# Patient Record
Sex: Female | Born: 1955
Health system: Southern US, Community
[De-identification: ages and names within clinical notes are randomized; demographics above are authoritative.]

## PROBLEM LIST (undated history)

## (undated) DIAGNOSIS — E785 Hyperlipidemia, unspecified: Secondary | ICD-10-CM

## (undated) DIAGNOSIS — J309 Allergic rhinitis, unspecified: Secondary | ICD-10-CM

## (undated) DIAGNOSIS — K5909 Other constipation: Secondary | ICD-10-CM

## (undated) DIAGNOSIS — I10 Essential (primary) hypertension: Secondary | ICD-10-CM

## (undated) DIAGNOSIS — G250 Essential tremor: Secondary | ICD-10-CM

## (undated) DIAGNOSIS — K449 Diaphragmatic hernia without obstruction or gangrene: Secondary | ICD-10-CM

## (undated) DIAGNOSIS — G8929 Other chronic pain: Secondary | ICD-10-CM

## (undated) DIAGNOSIS — IMO0001 Reserved for inherently not codable concepts without codable children: Secondary | ICD-10-CM

## (undated) DIAGNOSIS — H409 Unspecified glaucoma: Secondary | ICD-10-CM

## (undated) DIAGNOSIS — K529 Noninfective gastroenteritis and colitis, unspecified: Secondary | ICD-10-CM

## (undated) DIAGNOSIS — F329 Major depressive disorder, single episode, unspecified: Secondary | ICD-10-CM

## (undated) DIAGNOSIS — R7989 Other specified abnormal findings of blood chemistry: Secondary | ICD-10-CM

## (undated) DIAGNOSIS — R945 Abnormal results of liver function studies: Secondary | ICD-10-CM

## (undated) DIAGNOSIS — C4370 Malignant melanoma of unspecified lower limb, including hip: Secondary | ICD-10-CM

## (undated) DIAGNOSIS — F32A Depression, unspecified: Secondary | ICD-10-CM

## (undated) DIAGNOSIS — K219 Gastro-esophageal reflux disease without esophagitis: Secondary | ICD-10-CM

## (undated) DIAGNOSIS — A0472 Enterocolitis due to Clostridium difficile, not specified as recurrent: Secondary | ICD-10-CM

## (undated) DIAGNOSIS — M722 Plantar fascial fibromatosis: Secondary | ICD-10-CM

## (undated) HISTORY — DX: Other specified abnormal findings of blood chemistry: R79.89

## (undated) HISTORY — DX: Major depressive disorder, single episode, unspecified: F32.9

## (undated) HISTORY — DX: Gastro-esophageal reflux disease without esophagitis: K21.9

## (undated) HISTORY — DX: Other constipation: K59.09

## (undated) HISTORY — DX: Essential (primary) hypertension: I10

## (undated) HISTORY — DX: Malignant melanoma of unspecified lower limb, including hip: C43.70

## (undated) HISTORY — DX: Other chronic pain: G89.29

## (undated) HISTORY — PX: NASAL SINUS SURGERY: SHX719

## (undated) HISTORY — DX: Abnormal results of liver function studies: R94.5

## (undated) HISTORY — DX: Essential tremor: G25.0

## (undated) HISTORY — DX: Diaphragmatic hernia without obstruction or gangrene: K44.9

## (undated) HISTORY — DX: Plantar fascial fibromatosis: M72.2

## (undated) HISTORY — DX: Depression, unspecified: F32.A

## (undated) HISTORY — DX: Enterocolitis due to Clostridium difficile, not specified as recurrent: A04.72

## (undated) HISTORY — DX: Allergic rhinitis, unspecified: J30.9

## (undated) HISTORY — DX: Noninfective gastroenteritis and colitis, unspecified: K52.9

## (undated) HISTORY — PX: COLONOSCOPY: SHX174

## (undated) HISTORY — DX: Hyperlipidemia, unspecified: E78.5

## (undated) HISTORY — PX: TEMPOROMANDIBULAR JOINT SURGERY: SHX35

## (undated) HISTORY — PX: BREAST SURGERY: SHX581

## (undated) HISTORY — DX: Unspecified glaucoma: H40.9

## (undated) HISTORY — DX: Reserved for inherently not codable concepts without codable children: IMO0001

---

## 1992-12-21 DIAGNOSIS — A0472 Enterocolitis due to Clostridium difficile, not specified as recurrent: Secondary | ICD-10-CM | POA: Insufficient documentation

## 1992-12-21 HISTORY — DX: Enterocolitis due to Clostridium difficile, not specified as recurrent: A04.72

## 1997-12-21 DIAGNOSIS — C4370 Malignant melanoma of unspecified lower limb, including hip: Secondary | ICD-10-CM

## 1997-12-21 HISTORY — PX: OTHER SURGICAL HISTORY: SHX169

## 1997-12-21 HISTORY — DX: Malignant melanoma of unspecified lower limb, including hip: C43.70

## 1999-11-18 ENCOUNTER — Other Ambulatory Visit: Admission: RE | Admit: 1999-11-18 | Discharge: 1999-11-18 | Payer: Self-pay | Admitting: Obstetrics and Gynecology

## 2000-01-06 ENCOUNTER — Encounter: Admission: RE | Admit: 2000-01-06 | Discharge: 2000-01-06 | Payer: Self-pay | Admitting: Gastroenterology

## 2000-01-06 ENCOUNTER — Encounter: Payer: Self-pay | Admitting: Gastroenterology

## 2000-03-03 ENCOUNTER — Encounter: Payer: Self-pay | Admitting: Dermatology

## 2000-03-03 ENCOUNTER — Encounter: Admission: RE | Admit: 2000-03-03 | Discharge: 2000-03-03 | Payer: Self-pay | Admitting: Dermatology

## 2000-11-23 ENCOUNTER — Other Ambulatory Visit: Admission: RE | Admit: 2000-11-23 | Discharge: 2000-11-23 | Payer: Self-pay | Admitting: Obstetrics and Gynecology

## 2001-02-02 ENCOUNTER — Other Ambulatory Visit: Admission: RE | Admit: 2001-02-02 | Discharge: 2001-02-02 | Payer: Self-pay | Admitting: Otolaryngology

## 2001-05-02 ENCOUNTER — Encounter: Admission: RE | Admit: 2001-05-02 | Discharge: 2001-05-02 | Payer: Self-pay | Admitting: Dermatology

## 2001-05-02 ENCOUNTER — Encounter: Payer: Self-pay | Admitting: Dermatology

## 2001-08-01 ENCOUNTER — Other Ambulatory Visit: Admission: RE | Admit: 2001-08-01 | Discharge: 2001-08-01 | Payer: Self-pay | Admitting: Obstetrics and Gynecology

## 2001-12-01 ENCOUNTER — Encounter: Admission: RE | Admit: 2001-12-01 | Discharge: 2001-12-01 | Payer: Self-pay | Admitting: Orthopaedic Surgery

## 2001-12-01 ENCOUNTER — Encounter: Payer: Self-pay | Admitting: Orthopaedic Surgery

## 2002-08-22 ENCOUNTER — Other Ambulatory Visit: Admission: RE | Admit: 2002-08-22 | Discharge: 2002-08-22 | Payer: Self-pay | Admitting: Obstetrics and Gynecology

## 2002-12-01 ENCOUNTER — Ambulatory Visit (HOSPITAL_COMMUNITY): Admission: RE | Admit: 2002-12-01 | Discharge: 2002-12-01 | Payer: Self-pay | Admitting: Gastroenterology

## 2003-09-03 ENCOUNTER — Other Ambulatory Visit: Admission: RE | Admit: 2003-09-03 | Discharge: 2003-09-03 | Payer: Self-pay | Admitting: Obstetrics and Gynecology

## 2004-12-21 HISTORY — PX: DILATION AND CURETTAGE OF UTERUS: SHX78

## 2005-10-28 ENCOUNTER — Ambulatory Visit (HOSPITAL_BASED_OUTPATIENT_CLINIC_OR_DEPARTMENT_OTHER): Admission: RE | Admit: 2005-10-28 | Discharge: 2005-10-28 | Payer: Self-pay | Admitting: Obstetrics and Gynecology

## 2005-10-28 ENCOUNTER — Ambulatory Visit (HOSPITAL_COMMUNITY): Admission: RE | Admit: 2005-10-28 | Discharge: 2005-10-28 | Payer: Self-pay | Admitting: Obstetrics and Gynecology

## 2006-10-21 DIAGNOSIS — E785 Hyperlipidemia, unspecified: Secondary | ICD-10-CM

## 2006-10-21 HISTORY — DX: Hyperlipidemia, unspecified: E78.5

## 2007-02-14 ENCOUNTER — Encounter: Admission: RE | Admit: 2007-02-14 | Discharge: 2007-02-14 | Payer: Self-pay | Admitting: Family Medicine

## 2011-03-22 HISTORY — PX: SHOULDER SURGERY: SHX246

## 2011-04-20 ENCOUNTER — Institutional Professional Consult (permissible substitution) (INDEPENDENT_AMBULATORY_CARE_PROVIDER_SITE_OTHER): Payer: BC Managed Care – PPO | Admitting: Family Medicine

## 2011-04-20 DIAGNOSIS — I10 Essential (primary) hypertension: Secondary | ICD-10-CM

## 2011-04-20 DIAGNOSIS — E7800 Pure hypercholesterolemia, unspecified: Secondary | ICD-10-CM

## 2011-04-20 DIAGNOSIS — R748 Abnormal levels of other serum enzymes: Secondary | ICD-10-CM

## 2011-04-23 ENCOUNTER — Encounter: Payer: Self-pay | Admitting: Family Medicine

## 2011-05-06 ENCOUNTER — Telehealth: Payer: Self-pay | Admitting: *Deleted

## 2011-05-06 NOTE — Telephone Encounter (Signed)
Called patient to give instructions per Dr.Knapp, left message for her to return my call.

## 2011-05-07 ENCOUNTER — Telehealth: Payer: Self-pay | Admitting: *Deleted

## 2011-05-07 NOTE — Telephone Encounter (Signed)
Spoke with patient. Informed pt to stay off pravastatin, cont with plan to recheck liver tests in July 2012(pt sch for 06/22/11@ 8:30am) also informed patient to avoid alcohol and tylenol. Informed pt that she doesn't to continue with rx vitamin d as she was already on it for 3 months, but to take OTC vitamin D, 2,000iu daily. Pt verbalized understanding. vs

## 2011-06-22 ENCOUNTER — Other Ambulatory Visit: Payer: BC Managed Care – PPO

## 2011-06-22 DIAGNOSIS — E785 Hyperlipidemia, unspecified: Secondary | ICD-10-CM

## 2011-06-22 LAB — LIPID PANEL
Cholesterol: 177 mg/dL (ref 0–200)
Triglycerides: 95 mg/dL (ref <150)

## 2011-06-23 LAB — HEPATIC FUNCTION PANEL
Alkaline Phosphatase: 103 U/L (ref 39–117)
Indirect Bilirubin: 0.4 mg/dL (ref 0.0–0.9)
Total Protein: 6.8 g/dL (ref 6.0–8.3)

## 2011-07-31 ENCOUNTER — Other Ambulatory Visit: Payer: Self-pay | Admitting: *Deleted

## 2011-07-31 MED ORDER — METOPROLOL TARTRATE 25 MG PO TABS: 25.0000 mg | ORAL_TABLET | Freq: Two times a day (BID) | ORAL | Status: DC

## 2011-10-19 ENCOUNTER — Encounter: Payer: Self-pay | Admitting: Family Medicine

## 2011-10-19 ENCOUNTER — Ambulatory Visit (INDEPENDENT_AMBULATORY_CARE_PROVIDER_SITE_OTHER): Payer: BC Managed Care – PPO | Admitting: Family Medicine

## 2011-10-19 VITALS — BP 130/86 | HR 68 | Ht 66.5 in | Wt 161.0 lb

## 2011-10-19 DIAGNOSIS — R748 Abnormal levels of other serum enzymes: Secondary | ICD-10-CM

## 2011-10-19 DIAGNOSIS — I10 Essential (primary) hypertension: Secondary | ICD-10-CM

## 2011-10-19 DIAGNOSIS — E559 Vitamin D deficiency, unspecified: Secondary | ICD-10-CM

## 2011-10-19 DIAGNOSIS — Z23 Encounter for immunization: Secondary | ICD-10-CM

## 2011-10-19 DIAGNOSIS — E7800 Pure hypercholesterolemia, unspecified: Secondary | ICD-10-CM

## 2011-10-19 LAB — HEPATIC FUNCTION PANEL
ALT: 66 U/L — ABNORMAL HIGH (ref 0–35)
AST: 28 U/L (ref 0–37)
Bilirubin, Direct: 0.1 mg/dL (ref 0.0–0.3)

## 2011-10-19 NOTE — Progress Notes (Signed)
Patient presents for 6 month f/u on HTN, as well as f/u on elevated liver tests, lipids, and is due to have Vitamin D level re-checked.  She has been off of Pravastatin since the beginning of the year.  Lipids were checked here in July, and was at goal off medications.  Liver enzymes remained mildly elevated, but stable. Lab Results  Component Value Date   CHOL 177 06/22/2011   HDL 61 06/22/2011   LDLCALC 97 06/22/2011   TRIG 95 06/22/2011   CHOLHDL 2.9 06/22/2011   Hypertension follow-up:  Blood pressures elsewhere are 121-147/61-73. P 52-56. Denies dizziness, headaches, chest pain.  Denies side effects of medications.  Sometimes gets fatigued just walking up a flight of steps.  Works out with trainer weekly, and denies easy fatigability or other problems during exercise.  Vitamin D deficiency--took a course of prescription Vitamin D (3 months).  She was recommended to take 2000 IU daily.  She is getting her vitamin D through her calcium supplement and her MVI.  Due for recheck today.  Past Medical History  Diagnosis Date  . Depression     h/o hospitalization '87  . Chronic pain     f/b Dr. Jerene Bears cervical DDD, disk bulge L5-S1  . Chronic constipation   . Hemorrhoids 12/08    internal and external  . Allergic rhinitis, cause unspecified     seasonal  . Melanoma of thigh 1999    R thigh, level 1 (Dr. Terri Piedra)  . C. difficile colitis 1994  . Hyperlipidemia 11/07  . Elevated liver function tests     normal work-up  . Vitamin D deficiency 2010  . Hypertension   . Reflux   . Hiatal hernia     Past Surgical History  Procedure Date  . Colonoscopy 11/2007    Dr. Ewing Schlein; due again 2013  . Shoulder surgery 03-2011    Right; bone spur removal (Dr. Thomasena Edis)  . Excision of melanoma 1999    R thigh  . Breast surgery 1987, 1998    Right breast--removal of benign tumor and benign cyst  . Temporomandibular joint surgery 1987, 1989    artificial disks in TMJ; Dr. Warren Danes  . Dilation  and curettage of uterus 2006    bleeding (benign)  . Nasal sinus surgery 1995, 2002    History   Social History  . Marital Status: Single    Spouse Name: N/A    Number of Children: 0  . Years of Education: N/A   Occupational History  . actuarial analyst    Social History Main Topics  . Smoking status: Never Smoker   . Smokeless tobacco: Never Used  . Alcohol Use: Yes     1 glass of wine per year.  . Drug Use: No  . Sexually Active: Not on file   Other Topics Concern  . Not on file   Social History Narrative   Lives alone, 2 cats    Family History  Problem Relation Age of Onset  . Hypertension Mother   . Allergies Mother   . Hyperlipidemia Mother   . Arthritis Mother   . Melanoma Mother   . Cancer Mother     uterine cancer and melanoma  . Heart disease Mother     atrial fibrillation  . Glaucoma Father   . Depression Father   . Cancer Father     prostate cancer, diagnosed in 79's  . Colon polyps Father   . Cancer Maternal Aunt  breast cancer in 40's  . Heart disease Maternal Aunt   . Heart disease Maternal Uncle   . Heart disease Maternal Uncle     Current outpatient prescriptions:acetaminophen-codeine (TYLENOL #3) 300-30 MG per tablet, Take 1 tablet by mouth daily.  , Disp: , Rfl: ;  ascorbic acid (VITAMIN C) 500 MG tablet, Take 1,000 mg by mouth daily. , Disp: , Rfl: ;  Calcium Carbonate-Vitamin D (CALCIUM 600+D) 600-400 MG-UNIT per tablet, Take 2 tablets by mouth daily.  , Disp: , Rfl: ;  docusate sodium (COLACE) 100 MG capsule, Take 100-200 mg by mouth daily.  , Disp: , Rfl:  fish oil-omega-3 fatty acids 1000 MG capsule, Take 2 g by mouth daily. , Disp: , Rfl: ;  fluticasone (FLONASE) 50 MCG/ACT nasal spray, 2 sprays by Nasal route as needed.  , Disp: , Rfl: ;  GLUCOSAMINE CHONDROITIN COMPLX PO, Take 2 tablets by mouth daily.  , Disp: , Rfl: ;  Magnesium 250 MG TABS, Take 1 tablet by mouth daily.  , Disp: , Rfl:  metoprolol tartrate (LOPRESSOR) 25 MG  tablet, Take 1 tablet (25 mg total) by mouth 2 (two) times daily., Disp: 180 tablet, Rfl: 0;  Multiple Vitamin (MULTIVITAMIN) tablet, Take 1 tablet by mouth daily.  , Disp: , Rfl: ;  Olopatadine HCl (PATADAY) 0.2 % SOLN, Apply 1 drop to eye daily. Daily to both eyes as needed. , Disp: , Rfl: ;  RABEprazole (ACIPHEX) 20 MG tablet, Take 20 mg by mouth daily.  , Disp: , Rfl:  sertraline (ZOLOFT) 100 MG tablet, Take 100 mg by mouth daily.  , Disp: , Rfl: ;  vitamin B-12 (CYANOCOBALAMIN) 1000 MCG tablet, Take 1,000 mcg by mouth daily.  , Disp: , Rfl:   Allergies  Allergen Reactions  . Darvocet (Propoxyphene N-Acetaminophen) Other (See Comments)    Faint, blood pressure goes sky high.  . Etodolac Other (See Comments)    Red face, arms-looked like bad sunburn.  . Septra (Bactrim) Hives   ROS: +stress headaches and headaches from her neck.  Denies chest pain, palpitations, shortness of breath.  Some occasional easy fatigability.  Denies edema. No fevers, URI symptoms.  See HPI.  PHYSICAL EXAM: BP 130/86  Pulse 68  Ht 5' 6.5" (1.689 m)  Wt 161 lb (73.029 kg)  BMI 25.60 kg/m2 Pleasant, well developed female in no distress  BP 140/73 with pt's wrist machine, L wrist 154/70 RA by MD Repeat Pt's L wrist 141/77 Neck: no lymphadenopathy or thyromegaly Heart: regular rate and rhythm without murmur Lungs: clear bilaterally Abdomen: soft, nontender, no organomegaly or mass Extremities: no edema Skin: no rash  ASSESSMENT/PLAN:  1. Need for Tdap vaccination  Tdap vaccine greater than or equal to 7yo IM  2. Unspecified vitamin D deficiency  Vitamin D 25 hydroxy  3. Other nonspecific abnormal serum enzyme levels  Hepatic function panel  4. Essential hypertension, benign  Comprehensive metabolic panel  5. Pure hypercholesterolemia  Lipid panel   Some component of white coat HTN.  HTN overall adequately controlled. She has some mild bradycardia related to beta blockers, but relatively asymptomatic.   Continue current meds.  Will pharmacy contact us when refills are needed.  F/u 6 months with labs prior, sooner prn

## 2011-10-19 NOTE — Patient Instructions (Signed)
Continue monitoring your blood pressure and pulse.  Feel free to follow up sooner if having symptoms of dizziness, easy fatigability, shortness of breath, or other concerns.  Otherwise, see you in 6 months

## 2011-10-20 ENCOUNTER — Encounter: Payer: Self-pay | Admitting: Family Medicine

## 2011-10-20 DIAGNOSIS — I10 Essential (primary) hypertension: Secondary | ICD-10-CM | POA: Insufficient documentation

## 2011-10-20 DIAGNOSIS — E7800 Pure hypercholesterolemia, unspecified: Secondary | ICD-10-CM | POA: Insufficient documentation

## 2011-12-18 ENCOUNTER — Other Ambulatory Visit: Payer: Self-pay

## 2012-01-07 ENCOUNTER — Other Ambulatory Visit: Payer: Self-pay | Admitting: Family Medicine

## 2012-04-05 ENCOUNTER — Telehealth: Payer: Self-pay | Admitting: Family Medicine

## 2012-04-05 ENCOUNTER — Other Ambulatory Visit: Payer: Self-pay | Admitting: Family Medicine

## 2012-04-05 DIAGNOSIS — F329 Major depressive disorder, single episode, unspecified: Secondary | ICD-10-CM

## 2012-04-05 MED ORDER — SERTRALINE HCL 100 MG PO TABS: 100.0000 mg | ORAL_TABLET | Freq: Every day | ORAL | Status: DC

## 2012-04-05 NOTE — Telephone Encounter (Signed)
Patient needs to come in for a office visit.

## 2012-04-05 NOTE — Telephone Encounter (Signed)
done

## 2012-04-18 ENCOUNTER — Other Ambulatory Visit: Payer: BC Managed Care – PPO

## 2012-04-18 ENCOUNTER — Telehealth: Payer: Self-pay | Admitting: Family Medicine

## 2012-04-18 DIAGNOSIS — E7800 Pure hypercholesterolemia, unspecified: Secondary | ICD-10-CM

## 2012-04-18 DIAGNOSIS — I10 Essential (primary) hypertension: Secondary | ICD-10-CM

## 2012-04-18 LAB — LIPID PANEL
HDL: 61 mg/dL (ref >39)
LDL Cholesterol: 112 mg/dL — ABNORMAL HIGH (ref 0–99)
Total CHOL/HDL Ratio: 3.1 Ratio

## 2012-04-18 LAB — COMPREHENSIVE METABOLIC PANEL
ALT: 36 U/L — ABNORMAL HIGH (ref 0–35)
AST: 35 U/L (ref 0–37)
Albumin: 4.6 g/dL (ref 3.5–5.2)
Alkaline Phosphatase: 86 U/L (ref 39–117)
BUN: 17 mg/dL (ref 6–23)
Calcium: 9.5 mg/dL (ref 8.4–10.5)
Chloride: 101 mEq/L (ref 96–112)
Creat: 0.71 mg/dL (ref 0.50–1.10)
Potassium: 4.2 mEq/L (ref 3.5–5.3)

## 2012-04-20 ENCOUNTER — Ambulatory Visit: Payer: BC Managed Care – PPO | Admitting: Family Medicine

## 2012-04-27 ENCOUNTER — Encounter: Payer: Self-pay | Admitting: Family Medicine

## 2012-04-27 ENCOUNTER — Ambulatory Visit (INDEPENDENT_AMBULATORY_CARE_PROVIDER_SITE_OTHER): Payer: BC Managed Care – PPO | Admitting: Family Medicine

## 2012-04-27 VITALS — BP 124/70 | HR 68 | Ht 66.5 in | Wt 167.0 lb

## 2012-04-27 DIAGNOSIS — E7800 Pure hypercholesterolemia, unspecified: Secondary | ICD-10-CM

## 2012-04-27 DIAGNOSIS — R748 Abnormal levels of other serum enzymes: Secondary | ICD-10-CM

## 2012-04-27 DIAGNOSIS — I10 Essential (primary) hypertension: Secondary | ICD-10-CM

## 2012-04-27 DIAGNOSIS — E559 Vitamin D deficiency, unspecified: Secondary | ICD-10-CM

## 2012-04-27 DIAGNOSIS — R7301 Impaired fasting glucose: Secondary | ICD-10-CM

## 2012-04-27 MED ORDER — METOPROLOL TARTRATE 25 MG PO TABS: 25.0000 mg | ORAL_TABLET | Freq: Two times a day (BID) | ORAL | Status: DC

## 2012-04-27 NOTE — Patient Instructions (Addendum)
Goal blood pressure is <130-135/80-85.  Try and exercise at least 30 minutes 5-6 days/week, with weight-bearing exercise once or twice a week.  Try and follow low sodium diet, and lose the few pounds that you have gained. Your sugar was only slightly above normal--try and limit the sweets in your diet  You had your TdaP (tetanus and pertussis vaccine ) 10/19/2011.  Check with your insurance regarding shingles vaccine coverage (zostavax).  If it isn't covered until age 66, then we will hold off.  Some cover it after age 4, in which case we can give it to you at your next visit.

## 2012-04-27 NOTE — Progress Notes (Signed)
Patient presents for 6 month f/u on hypertension and hyperlipidemia.  Hypertension follow-up:  Blood pressures elsewhere are 134/66 at work once, otherwise hasn't checked it.  Denies dizziness, headaches, chest pain.  Denies side effects of medications.  Hyperlipidemia follow-up:  Patient is reportedly following a low-fat, low cholesterol diet, although admits to some recent dietary noncompliance. No longer on any lipid-lowering medications (off pravastatin for about a year), just taking fish oil.  LFT's had been high in the past, and pravastatin was stopped in 03/2011--lipids remained at goal 3 months later. This is first re-check since then.  GERD:  Cut back dose to every other day of Aciphex about 6 weeks ago, as suggested by Dr. Ewing Schlein at her last visit.  So far, she is managing pretty well, no significant recurrent reflux.  Questions regarding gabapentin--just given Rx from Dr. Ethelene Hal when she complained for some pain shooting down the R leg.  Pain is intermittent.  She was given 300mg  to take as needed.  She read info about med, concerned about sedation, hasn't really had the pain again, so hasn't started it yet.  Weight has been creeping up, worse with 2 recent trips (to beach and [place]) where she ate poorly. Hasn't been exercising as much.  Still working with a Systems analyst, but cut back from twice a week to once a week. Not getting much other exercise.  Past Medical History  Diagnosis Date  . Depression     h/o hospitalization '87  . Chronic pain     f/b Dr. Jerene Bears cervical DDD, disk bulge L5-S1  . Chronic constipation   . Hemorrhoids 12/08    internal and external  . Allergic rhinitis, cause unspecified     seasonal  . Melanoma of thigh 1999    R thigh, level 1 (Dr. Terri Piedra)  . C. difficile colitis 1994  . Hyperlipidemia 11/07  . Elevated liver function tests     normal work-up  . Vitamin d deficiency 2010  . Hypertension   . Reflux   . Hiatal hernia     Past  Surgical History  Procedure Date  . Colonoscopy 11/2007    Dr. Ewing Schlein; due again 2013  . Shoulder surgery 03-2011    Right; bone spur removal (Dr. Thomasena Edis)  . Excision of melanoma 1999    R thigh  . Breast surgery 1987, 1998    Right breast--removal of benign tumor and benign cyst  . Temporomandibular joint surgery 1987, 1989    artificial disks in TMJ; Dr. Warren Danes  . Dilation and curettage of uterus 2006    bleeding (benign)  . Nasal sinus surgery 1995, 2002    History   Social History  . Marital Status: Single    Spouse Name: N/A    Number of Children: 0  . Years of Education: N/A   Occupational History  . actuarial analyst    Social History Main Topics  . Smoking status: Never Smoker   . Smokeless tobacco: Never Used  . Alcohol Use: Yes     1 glass of wine per year.  . Drug Use: No  . Sexually Active: Not on file   Other Topics Concern  . Not on file   Social History Narrative   Lives alone, 2 cats    Family History  Problem Relation Age of Onset  . Hypertension Mother   . Allergies Mother   . Hyperlipidemia Mother   . Arthritis Mother   . Melanoma Mother   . Cancer Mother  uterine cancer and melanoma  . Heart disease Mother     atrial fibrillation  . Glaucoma Father   . Depression Father   . Cancer Father     prostate cancer, diagnosed in 32's  . Colon polyps Father   . Cancer Maternal Aunt     breast cancer in 40's  . Heart disease Maternal Aunt   . Heart disease Maternal Uncle   . Heart disease Maternal Uncle     Current outpatient prescriptions:acetaminophen-codeine (TYLENOL #3) 300-30 MG per tablet, Take 1 tablet by mouth daily.  , Disp: , Rfl: ;  ascorbic acid (VITAMIN C) 500 MG tablet, Take 1,000 mg by mouth daily. , Disp: , Rfl: ;  Calcium Carbonate-Vitamin D (CALCIUM 600+D) 600-400 MG-UNIT per tablet, Take 2 tablets by mouth daily.  , Disp: , Rfl: ;  docusate sodium (COLACE) 100 MG capsule, Take 100-200 mg by mouth daily.  , Disp: ,  Rfl:  fish oil-omega-3 fatty acids 1000 MG capsule, Take 2 g by mouth daily. , Disp: , Rfl: ;  fluticasone (FLONASE) 50 MCG/ACT nasal spray, 2 sprays by Nasal route as needed.  , Disp: , Rfl: ;  GLUCOSAMINE CHONDROITIN COMPLX PO, Take 2 tablets by mouth daily.  , Disp: , Rfl: ;  Magnesium 250 MG TABS, Take 1 tablet by mouth daily.  , Disp: , Rfl:  metoprolol tartrate (LOPRESSOR) 25 MG tablet, Take 1 tablet (25 mg total) by mouth 2 (two) times daily., Disp: 180 tablet, Rfl: 1;  Multiple Vitamin (MULTIVITAMIN) tablet, Take 1 tablet by mouth daily.  , Disp: , Rfl: ;  Olopatadine HCl (PATADAY) 0.2 % SOLN, Apply 1 drop to eye daily. Daily to both eyes as needed. , Disp: , Rfl: ;  RABEprazole (ACIPHEX) 20 MG tablet, Take 20 mg by mouth every other day. , Disp: , Rfl:  sertraline (ZOLOFT) 100 MG tablet, Take 1 tablet (100 mg total) by mouth daily., Disp: 90 tablet, Rfl: 0;  vitamin B-12 (CYANOCOBALAMIN) 1000 MCG tablet, Take 1,000 mcg by mouth daily.  , Disp: , Rfl: ;  DISCONTD: metoprolol tartrate (LOPRESSOR) 25 MG tablet, TAKE ONE TABLET BY MOUTH TWICE DAILY, Disp: 60 tablet, Rfl: 0;  gabapentin (NEURONTIN) 300 MG capsule, Take 300 mg by mouth at bedtime., Disp: , Rfl:   Allergies  Allergen Reactions  . Darvocet (Propoxyphene-Acetaminophen) Other (See Comments)    Faint, blood pressure goes sky high.  . Etodolac Other (See Comments)    Red face, arms-looked like bad sunburn.  . Septra (Bactrim) Hives   ROS: Some neck and back pain.  Slight pain in R side today, thinks from gas. Denies fevers.  Some mild allergies, no cough, shortness of breath, GI complaints, skin rash, chest pain, of other concerns.  See HPI  PHYSICAL EXAM: BP 130/84  Pulse 68  Ht 5' 6.5" (1.689 m)  Wt 167 lb (75.751 kg)  BMI 26.55 kg/m2 124/70 on repeat by MD RA Well developed, pleasant female in no distress Neck: no lymphadenopathy, thyromegaly or carotid bruit Heart: regular rate and rhythm without murmur Lungs: clear  bilaterally Abdomen: soft, nontender, no mass, no organomegaly Extremities: no edema, 2+ pulse Back: no muscle spasm, nontender Skin: no rash Psych: normal mood, affect, hygiene and grooming   Lab Results  Component Value Date   CHOL 191 04/18/2012   HDL 61 04/18/2012   LDLCALC 112* 04/18/2012   TRIG 91 04/18/2012   CHOLHDL 3.1 04/18/2012     Chemistry  Component Value Date/Time   NA 135 04/18/2012 0842   K 4.2 04/18/2012 0842   CL 101 04/18/2012 0842   CO2 24 04/18/2012 0842   BUN 17 04/18/2012 0842   CREATININE 0.71 04/18/2012 0842      Component Value Date/Time   CALCIUM 9.5 04/18/2012 0842   ALKPHOS 86 04/18/2012 0842   AST 35 04/18/2012 0842   ALT 36* 04/18/2012 0842   BILITOT 0.5 04/18/2012 0842      ASSESSMENT/PLAN:  1. Pure hypercholesterolemia  Lipid panel  2. Essential hypertension, benign  metoprolol tartrate (LOPRESSOR) 25 MG tablet  3. Impaired fasting glucose  Hemoglobin A1c, Glucose, random  4. Unspecified vitamin D deficiency  Vitamin D 25 hydroxy  5. Other nonspecific abnormal serum enzyme levels  Hepatic function panel   HTN--well controlled.  Daily exercise and weight loss is recommended.  Hyperlipidemia--higher than previously, but LDL still at goal <130.  Continue lowfat, low cholesterol diet.  Mildly impaired fasting glucose--discussed need for regular exercise, watching sweets in the diet, and keeping weight down.  Risks of longterm PPI vs untreated reflux reviewed. Continue qod, consider cutting back further if not recurrence of symptoms, using prn with food that she knows will trigger symptoms.  Reassured regarding risks and side effects of neurontin. Since she is having ongoing neuropathic/radicular pain, she will try starting neurontin at bedtime prn.  F/u in 6 months for CPE, with fasting labs prior

## 2012-05-17 NOTE — Telephone Encounter (Signed)
DONE

## 2012-06-01 ENCOUNTER — Encounter: Payer: Self-pay | Admitting: Family Medicine

## 2012-06-01 ENCOUNTER — Ambulatory Visit (INDEPENDENT_AMBULATORY_CARE_PROVIDER_SITE_OTHER): Payer: BC Managed Care – PPO | Admitting: Family Medicine

## 2012-06-01 VITALS — BP 160/80 | HR 68 | Temp 97.5°F | Ht 66.5 in | Wt 168.0 lb

## 2012-06-01 DIAGNOSIS — K219 Gastro-esophageal reflux disease without esophagitis: Secondary | ICD-10-CM

## 2012-06-01 DIAGNOSIS — R5383 Other fatigue: Secondary | ICD-10-CM

## 2012-06-01 DIAGNOSIS — R5381 Other malaise: Secondary | ICD-10-CM

## 2012-06-01 DIAGNOSIS — I10 Essential (primary) hypertension: Secondary | ICD-10-CM

## 2012-06-01 DIAGNOSIS — E559 Vitamin D deficiency, unspecified: Secondary | ICD-10-CM

## 2012-06-01 LAB — CBC WITH DIFFERENTIAL/PLATELET
Basophils Absolute: 0 10*3/uL (ref 0.0–0.1)
Basophils Relative: 1 % (ref 0–1)
Eosinophils Absolute: 0.2 10*3/uL (ref 0.0–0.7)
Eosinophils Relative: 3 % (ref 0–5)
HCT: 39.6 % (ref 36.0–46.0)
MCH: 32.2 pg (ref 26.0–34.0)
MCHC: 34.3 g/dL (ref 30.0–36.0)
Monocytes Absolute: 0.3 10*3/uL (ref 0.1–1.0)
Monocytes Relative: 7 % (ref 3–12)
Neutro Abs: 2 10*3/uL (ref 1.7–7.7)
RDW: 13.3 % (ref 11.5–15.5)

## 2012-06-01 LAB — COMPREHENSIVE METABOLIC PANEL
Alkaline Phosphatase: 106 U/L (ref 39–117)
BUN: 18 mg/dL (ref 6–23)
CO2: 23 mEq/L (ref 19–32)
Creat: 0.84 mg/dL (ref 0.50–1.10)
Glucose, Bld: 105 mg/dL — ABNORMAL HIGH (ref 70–99)
Sodium: 138 mEq/L (ref 135–145)
Total Bilirubin: 0.3 mg/dL (ref 0.3–1.2)

## 2012-06-01 NOTE — Progress Notes (Signed)
Chief Complaint  Patient presents with  . Fatigue    she gets weak where she feels like she could just "crumble down" Her stomach hurts sometimes, her head hurts sometimes. She just overall feels really weak and tired. Felt nauseous this am. Had two episodes:one at work and one at the gym.   HPI: She was fine until about 2-3 weeks ago (seen last month and was fine).  "I just haven't felt well".  Sometimes she feels really hot, and feels weaker than usual.  Gets spells where she feels so weak she could crumble.  Sometimes gets headaches, seems to go away in a couple of hours (just takes her regular Advil).  Yesterday while at work, she felt hot.  Walked down [employer], and supervisor said she didn't look good--had felt hot, weak.  Denies feeling lightheaded or faint.  2 weeks ago saw personal trainer and had a tough time working out.  She had a headache that day.  Trainer was concerned because she looked "pink".  Today had abdominal pain for much of the morning.  Mild nausea this morning.  She had cut back to every other day on her Aciphex, but then felt like she was having recurrent symptoms, so started taking it more frequently (5 of the last 7 days).  She had started cookies with flaxseed 2 weeks ago, thought it bothered her stomach, so she just stopped taking it 3 days ago.  Stomach was a little better yesterday, but had recurrent pain today.  Pain got better after lunch, no pain now. Only other change was an eye drop that she has been taking for the last 2 weeks--begins with L and doesn't know name--plan was to take for 2 weeks, then change to restasis for dry eyes.  Past Medical History  Diagnosis Date  . Depression     h/o hospitalization '87  . Chronic pain     f/b Dr. Jerene Bears cervical DDD, disk bulge L5-S1  . Chronic constipation   . Hemorrhoids 12/08    internal and external  . Allergic rhinitis, cause unspecified     seasonal  . Melanoma of thigh 1999    R thigh, level  1 (Dr. Terri Piedra)  . C. difficile colitis 1994  . Hyperlipidemia 11/07  . Elevated liver function tests     normal work-up  . Vitamin d deficiency 2010  . Hypertension   . Reflux   . Hiatal hernia    Past Surgical History  Procedure Date  . Colonoscopy 11/2007    Dr. Ewing Schlein; due again 2013  . Shoulder surgery 03-2011    Right; bone spur removal (Dr. Thomasena Edis)  . Excision of melanoma 1999    R thigh  . Breast surgery 1987, 1998    Right breast--removal of benign tumor and benign cyst  . Temporomandibular joint surgery 1987, 1989    artificial disks in TMJ; Dr. Warren Danes  . Dilation and curettage of uterus 2006    bleeding (benign)  . Nasal sinus surgery 1995, 2002   History   Social History  . Marital Status: Single    Spouse Name: N/A    Number of Children: 0  . Years of Education: N/A   Occupational History  . actuarial analyst    Social History Main Topics  . Smoking status: Never Smoker   . Smokeless tobacco: Never Used  . Alcohol Use: Yes     1 glass of wine per year.  . Drug Use: No  . Sexually  Active: Not on file   Other Topics Concern  . Not on file   Social History Narrative   Lives alone, 2 cats   Current Outpatient Prescriptions on File Prior to Visit  Medication Sig Dispense Refill  . acetaminophen-codeine (TYLENOL #3) 300-30 MG per tablet Take 1 tablet by mouth daily.        Marland Kitchen ascorbic acid (VITAMIN C) 500 MG tablet Take 1,000 mg by mouth daily.       . Calcium Carbonate-Vitamin D (CALCIUM 600+D) 600-400 MG-UNIT per tablet Take 2 tablets by mouth daily.        Marland Kitchen docusate sodium (COLACE) 100 MG capsule Take 100-200 mg by mouth daily.        . fish oil-omega-3 fatty acids 1000 MG capsule Take 2 g by mouth daily.       . fluticasone (FLONASE) 50 MCG/ACT nasal spray 2 sprays by Nasal route as needed.        Marland Kitchen GLUCOSAMINE CHONDROITIN COMPLX PO Take 2 tablets by mouth daily.        . Magnesium 250 MG TABS Take 1 tablet by mouth daily.        . metoprolol  tartrate (LOPRESSOR) 25 MG tablet Take 1 tablet (25 mg total) by mouth 2 (two) times daily.  180 tablet  1  . Multiple Vitamin (MULTIVITAMIN) tablet Take 1 tablet by mouth daily.        . Olopatadine HCl (PATADAY) 0.2 % SOLN Apply 1 drop to eye daily. Daily to both eyes as needed.       . RABEprazole (ACIPHEX) 20 MG tablet Take 20 mg by mouth daily.       . sertraline (ZOLOFT) 100 MG tablet Take 1 tablet (100 mg total) by mouth daily.  90 tablet  0  . vitamin B-12 (CYANOCOBALAMIN) 1000 MCG tablet Take 1,000 mcg by mouth daily.        Marland Kitchen gabapentin (NEURONTIN) 300 MG capsule Take 300 mg by mouth at bedtime.       Allergies  Allergen Reactions  . Darvocet (Propoxyphene-Acetaminophen) Other (See Comments)    Faint, blood pressure goes sky high.  . Etodolac Other (See Comments)    Red face, arms-looked like bad sunburn.  . Septra (Bactrim) Hives   ROS:  Chronic constipation. Denies fevers. Yesterday had some pain across her eyes, and slight R ear discomfort, better today. Feels weak, tired, but feels like her concentration has been fine, able to work.  Has some discomfort in her chest at time of visit--thinks ?related to reflux.  PHYSICAL EXAM: BP 142/88  Pulse 68  Temp 97.5 F (36.4 C) (Oral)  Ht 5' 6.5" (1.689 m)  Wt 168 lb (76.204 kg)  BMI 26.71 kg/m2 160/70 on repeat by MD R arm, pulse 58 Well developed female, seems somewhat distracted, and slightly anxious and depressed, worried about why she isn't feeling well, and embarrassed that she can't seem to give a better history. HEENT:  PERRL, conjunctiva clear. Nasal mucosa, moderately edematous, with clear-white mucus on R side. Sinuses nontender.  OP clear. Neck: no lymphadenopathy, thyromegaly or mass.  No carotid bruit Heart: regular rate and rhythm, mild bradycardia Lungs: clear bilaterally Abdomen: soft, nontender, no mass, no bruit Extremities: no edema, 2+ pulse Skin: no rash  EKG--NSR, rate 59.  Poor R-wave progression.  No  acute abnl  ASSESSMENT/PLAN: 1. Essential hypertension, benign  Comprehensive metabolic panel  2. Unspecified vitamin D deficiency  Vitamin D 25 hydroxy  3. Other  malaise and fatigue  CBC with Differential, TSH, Comprehensive metabolic panel  4. GERD (gastroesophageal reflux disease)     CBC, c-met, TSH  If labs all normal, then refer to cardiology for further eval, possible echocardiogram to further evaluate her symptoms of fatigue and abnormal EKG.  If cardiac w/u is negative, then must consider that her symptoms are related to worsening depression and increase zoloft to 150mg .  Patient is very hesitant to increase zoloft at this time.  It doesn't appear that she is symptomatic from the very mild bradycardia.  HTN--a little elevated today, but overall has been well controlled.

## 2012-06-02 LAB — VITAMIN D 25 HYDROXY (VIT D DEFICIENCY, FRACTURES): Vit D, 25-Hydroxy: 39 ng/mL (ref 30–89)

## 2012-06-02 NOTE — Addendum Note (Signed)
Addended by: Joselyn Arrow on: 06/02/2012 08:23 AM   Modules accepted: Orders

## 2012-06-02 NOTE — Addendum Note (Signed)
Addended by: Debbrah Alar F on: 06/02/2012 08:06 AM   Modules accepted: Orders

## 2012-06-21 ENCOUNTER — Ambulatory Visit (INDEPENDENT_AMBULATORY_CARE_PROVIDER_SITE_OTHER): Payer: BC Managed Care – PPO | Admitting: Cardiovascular Disease

## 2012-06-21 ENCOUNTER — Encounter: Payer: Self-pay | Admitting: Cardiovascular Disease

## 2012-06-21 VITALS — BP 150/76 | HR 56 | Wt 167.0 lb

## 2012-06-21 DIAGNOSIS — E7800 Pure hypercholesterolemia, unspecified: Secondary | ICD-10-CM

## 2012-06-21 DIAGNOSIS — R5383 Other fatigue: Secondary | ICD-10-CM

## 2012-06-21 DIAGNOSIS — R5381 Other malaise: Secondary | ICD-10-CM

## 2012-06-21 DIAGNOSIS — R9431 Abnormal electrocardiogram [ECG] [EKG]: Secondary | ICD-10-CM | POA: Insufficient documentation

## 2012-06-21 DIAGNOSIS — I10 Essential (primary) hypertension: Secondary | ICD-10-CM

## 2012-06-21 NOTE — Assessment & Plan Note (Signed)
Well controlled.  Continue current medications and low sodium Dash type diet.    

## 2012-06-21 NOTE — Assessment & Plan Note (Signed)
F/U primary.  Discussed diet .

## 2012-06-21 NOTE — Progress Notes (Signed)
Patient ID: Amanda Baldwin, female   DOB: 1956-08-16, 56 y.o.   MRN: [id number] 56 yo history of HTN  On Rx .  Had weeks of malaise/fatigue and dyspnea with no etiology.  Lab work at primary Dr Lynelle Doctor was ok.  Symptoms slowly resolved and she is feeling better now.  ECG done 6/13 with LVH in limb leads ? Anterior MI with poor R wave progression.  No chest pain  No PND , orhtopnea or CHF No palpitations.  Mother had afib and actually died from a bad reaction to cardizem.  She has not had previous cardiac disease or w/u.  No old ECG;s available to me.  ROS: Denies fever, malais, weight loss, blurry vision, decreased visual acuity, cough, sputum, SOB, hemoptysis, pleuritic pain, palpitaitons, heartburn, abdominal pain, melena, lower extremity edema, claudication, or rash.  All other systems reviewed and negative   General: Affect appropriate Healthy:  appears stated age HEENT: normal Neck supple with no adenopathy JVP normal no bruits no thyromegaly Lungs clear with no wheezing and good diaphragmatic motion Heart:  S1/S2 no murmur,rub, gallop or click PMI normal Abdomen: benighn, BS positve, no tenderness, no AAA no bruit.  No HSM or HJR Distal pulses intact with no bruits No edema Neuro non-focal Skin warm and dry No muscular weakness  Medications Current Outpatient Prescriptions  Medication Sig Dispense Refill  . acetaminophen-codeine (TYLENOL #3) 300-30 MG per tablet Take 1 tablet by mouth daily.        Marland Kitchen ascorbic acid (VITAMIN C) 500 MG tablet Take 1,000 mg by mouth daily.       . Calcium Carbonate-Vitamin D (CALCIUM 600+D) 600-400 MG-UNIT per tablet Take 2 tablets by mouth daily.        Marland Kitchen docusate sodium (COLACE) 100 MG capsule Take 100-200 mg by mouth daily.        . fish oil-omega-3 fatty acids 1000 MG capsule Take 2 g by mouth daily.       . fluticasone (FLONASE) 50 MCG/ACT nasal spray 2 sprays by Nasal route as needed.        . gabapentin (NEURONTIN) 300 MG capsule Take 300 mg by  mouth at bedtime.      Marland Kitchen GLUCOSAMINE CHONDROITIN COMPLX PO Take 2 tablets by mouth daily.        Marland Kitchen ibuprofen (ADVIL) 200 MG tablet Take 400 mg by mouth every 8 (eight) hours as needed.      . Magnesium 250 MG TABS Take 1 tablet by mouth daily.        . metoprolol tartrate (LOPRESSOR) 25 MG tablet Take 1 tablet (25 mg total) by mouth 2 (two) times daily.  180 tablet  1  . Misc Natural Products (OSTEO BI-FLEX JOINT SHIELD PO) Take 1 tablet by mouth daily.      . Multiple Vitamin (MULTIVITAMIN) tablet Take 1 tablet by mouth daily.        . Olopatadine HCl (PATADAY) 0.2 % SOLN Apply 1 drop to eye daily. Daily to both eyes as needed.       . RABEprazole (ACIPHEX) 20 MG tablet Take 20 mg by mouth daily.       . sennosides-docusate sodium (SENOKOT-S) 8.6-50 MG tablet Take 1 tablet by mouth daily.      . sertraline (ZOLOFT) 100 MG tablet Take 1 tablet (100 mg total) by mouth daily.  90 tablet  0  . vitamin B-12 (CYANOCOBALAMIN) 1000 MCG tablet Take 1,000 mcg by mouth daily.  Allergies Darvocet; Etodolac; and Septra  Family History: Family History  Problem Relation Age of Onset  . Hypertension Mother   . Allergies Mother   . Hyperlipidemia Mother   . Arthritis Mother   . Melanoma Mother   . Cancer Mother     uterine cancer and melanoma  . Heart disease Mother     atrial fibrillation  . Glaucoma Father   . Depression Father   . Cancer Father     prostate cancer, diagnosed in 59's  . Colon polyps Father   . Cancer Maternal Aunt     breast cancer in 40's  . Heart disease Maternal Aunt   . Heart disease Maternal Uncle   . Heart disease Maternal Uncle     Social History: History   Social History  . Marital Status: Single    Spouse Name: N/A    Number of Children: 0  . Years of Education: N/A   Occupational History  . actuarial analyst    Social History Main Topics  . Smoking status: Never Smoker   . Smokeless tobacco: Never Used  . Alcohol Use: Yes     1 glass of  wine per year.  . Drug Use: No  . Sexually Active: Not on file   Other Topics Concern  . Not on file   Social History Narrative   Lives alone, 2 cats    Electrocardiogram: 06/02/12/  NSR rate 59  LVH limb leads and poor R wave progression ? Anterior MI  Assessment and Plan

## 2012-06-21 NOTE — Assessment & Plan Note (Signed)
Possible LVH may reflect HTN.  Poor R wave progression likely lead location relative to hear.t  F/U echo

## 2012-06-21 NOTE — Patient Instructions (Signed)
Your physician recommends that you schedule a follow-up appointment in: AS NEEDED Your physician has requested that you have an echocardiogram. Echocardiography is a painless test that uses sound waves to create images of your heart. It provides your doctor with information about the size and shape of your heart and how well your heart's chambers and valves are working. This procedure takes approximately one hour. There are no restrictions for this procedure. DX ABN EKG AND  FATIGUE Your physician recommends that you continue on your current medications as directed. Please refer to the Current Medication list given to you today.

## 2012-06-28 ENCOUNTER — Ambulatory Visit (HOSPITAL_COMMUNITY): Payer: BC Managed Care – PPO | Attending: Cardiology | Admitting: Radiology

## 2012-06-28 DIAGNOSIS — E785 Hyperlipidemia, unspecified: Secondary | ICD-10-CM | POA: Insufficient documentation

## 2012-06-28 DIAGNOSIS — I517 Cardiomegaly: Secondary | ICD-10-CM | POA: Insufficient documentation

## 2012-06-28 DIAGNOSIS — R9431 Abnormal electrocardiogram [ECG] [EKG]: Secondary | ICD-10-CM

## 2012-06-28 DIAGNOSIS — I10 Essential (primary) hypertension: Secondary | ICD-10-CM | POA: Insufficient documentation

## 2012-06-28 DIAGNOSIS — R5383 Other fatigue: Secondary | ICD-10-CM

## 2012-06-28 DIAGNOSIS — R5381 Other malaise: Secondary | ICD-10-CM | POA: Insufficient documentation

## 2012-06-28 NOTE — Progress Notes (Signed)
Echocardiogram performed.  

## 2012-06-29 ENCOUNTER — Other Ambulatory Visit: Payer: Self-pay | Admitting: Family Medicine

## 2012-10-17 ENCOUNTER — Other Ambulatory Visit: Payer: BC Managed Care – PPO

## 2012-10-17 DIAGNOSIS — R7301 Impaired fasting glucose: Secondary | ICD-10-CM

## 2012-10-17 DIAGNOSIS — E7800 Pure hypercholesterolemia, unspecified: Secondary | ICD-10-CM

## 2012-10-17 DIAGNOSIS — R748 Abnormal levels of other serum enzymes: Secondary | ICD-10-CM

## 2012-10-18 LAB — HEMOGLOBIN A1C
Hgb A1c MFr Bld: 5.8 % — ABNORMAL HIGH (ref <5.7)
Mean Plasma Glucose: 120 mg/dL — ABNORMAL HIGH (ref <117)

## 2012-10-18 LAB — LIPID PANEL
LDL Cholesterol: 111 mg/dL — ABNORMAL HIGH (ref 0–99)
VLDL: 15 mg/dL (ref 0–40)

## 2012-10-18 LAB — HEPATIC FUNCTION PANEL
Alkaline Phosphatase: 91 U/L (ref 39–117)
Bilirubin, Direct: 0.1 mg/dL (ref 0.0–0.3)
Indirect Bilirubin: 0.4 mg/dL (ref 0.0–0.9)
Total Bilirubin: 0.5 mg/dL (ref 0.3–1.2)

## 2012-10-24 ENCOUNTER — Encounter: Payer: BC Managed Care – PPO | Admitting: Family Medicine

## 2012-10-28 ENCOUNTER — Encounter: Payer: Self-pay | Admitting: Family Medicine

## 2012-10-28 ENCOUNTER — Ambulatory Visit (INDEPENDENT_AMBULATORY_CARE_PROVIDER_SITE_OTHER): Payer: BC Managed Care – PPO | Admitting: Family Medicine

## 2012-10-28 VITALS — BP 128/72 | HR 68 | Ht 66.75 in | Wt 170.0 lb

## 2012-10-28 DIAGNOSIS — K219 Gastro-esophageal reflux disease without esophagitis: Secondary | ICD-10-CM

## 2012-10-28 DIAGNOSIS — E7800 Pure hypercholesterolemia, unspecified: Secondary | ICD-10-CM

## 2012-10-28 DIAGNOSIS — Z0000 Encounter for general adult medical examination without abnormal findings: Secondary | ICD-10-CM

## 2012-10-28 DIAGNOSIS — I10 Essential (primary) hypertension: Secondary | ICD-10-CM

## 2012-10-28 DIAGNOSIS — Z23 Encounter for immunization: Secondary | ICD-10-CM

## 2012-10-28 DIAGNOSIS — F329 Major depressive disorder, single episode, unspecified: Secondary | ICD-10-CM

## 2012-10-28 LAB — POCT URINALYSIS DIPSTICK
Bilirubin, UA: NEGATIVE
Blood, UA: NEGATIVE
Ketones, UA: NEGATIVE
Spec Grav, UA: 1.01
pH, UA: 5

## 2012-10-28 MED ORDER — METOPROLOL TARTRATE 25 MG PO TABS: 25.0000 mg | ORAL_TABLET | Freq: Two times a day (BID) | ORAL | Status: DC

## 2012-10-28 MED ORDER — SERTRALINE HCL 100 MG PO TABS: 100.0000 mg | ORAL_TABLET | Freq: Every day | ORAL | Status: DC

## 2012-10-28 NOTE — Patient Instructions (Addendum)
HEALTH MAINTENANCE RECOMMENDATIONS:  It is recommended that you get at least 30 minutes of aerobic exercise at least 5 days/week (for weight loss, you may need as much as 60-90 minutes). This can be any activity that gets your heart rate up. This can be divided in 10-15 minute intervals if needed, but try and build up your endurance at least once a week.  Weight bearing exercise is also recommended twice weekly.  Eat a healthy diet with lots of vegetables, fruits and fiber.  "Colorful" foods have a lot of vitamins (ie green vegetables, tomatoes, red peppers, etc).  Limit sweet tea, regular sodas and alcoholic beverages, all of which has a lot of calories and sugar.  Up to 1 alcoholic drink daily may be beneficial for women (unless trying to lose weight, watch sugars).  Drink a lot of water.  Calcium recommendations are 1200-1500 mg daily (1500 mg for postmenopausal women or women without ovaries), and vitamin D 1000 IU daily.  This should be obtained from diet and/or supplements (vitamins), and calcium should not be taken all at once, but in divided doses.  Monthly self breast exams and yearly mammograms for women over the age of 23 is recommended.  Sunscreen of at least SPF 30 should be used on all sun-exposed parts of the skin when outside between the hours of 10 am and 4 pm (not just when at beach or pool, but even with exercise, golf, tennis, and yard work!)  Use a sunscreen that says "broad spectrum" so it covers both UVA and UVB rays, and make sure to reapply every 1-2 hours.  Remember to change the batteries in your smoke detectors when changing your clock times in the spring and fall.  Use your seat belt every time you are in a car, and please drive safely and not be distracted with cell phones and texting while driving.  Check with your insurance regarding coverage of Zostavax (shingles vaccine)--they may not cover it until you are age 33.  If covered, schedule nurse visit (at least a month  after your shots today)  Lab Results  Component Value Date   HGBA1C 5.8* 10/17/2012   Glucose 85 (normal is <100)  Lab Results  Component Value Date   CHOL 184 10/17/2012   HDL 58 10/17/2012   LDLCALC 111* 10/17/2012   TRIG 77 10/17/2012   CHOLHDL 3.2 10/17/2012   Lab Results  Component Value Date   ALT 37* 10/17/2012   AST 32 10/17/2012   ALKPHOS 91 10/17/2012   BILITOT 0.5 10/17/2012

## 2012-10-28 NOTE — Progress Notes (Signed)
Chief Complaint  Patient presents with  . Annual Exam    non fasting annual exam, labs done 10.28/13 no pap, sees gyn and is up to date. Would like to discuss flu ,pneumonia and shingles vaccine. Did not do eye exam as she has appt with eye doctor in 2 weeks.   Amanda Baldwin is a 56 y.o. female who presents for a complete physical.  She needs refills on her medications.  Depression:  Doing well on Zoloft, moods good.  Has had some weight gain and fatigue, but relates the weight gain to exercising less.  HTN--BP's run a little higher at work than here today, but she didn't bring paper with values.  BP 130/80 at Dr. Marlane Hatcher earlier today.  Denies headaches, dizziness, chest pain, palpitations.  Hyperlipidemia--previously took meds, but has been off lipid-lowering meds for years, and numbers have been okay.  Had labs and is here to f/u on labs.  Has been following low cholesterol diet.  Sometimes feels like her energy is low, falls asleep easily.  No known snoring.  Doesn't fall asleep while driving.  When she was here last in June, she was having much worse symptoms of fatigue, where she felt like she could "crumble".  Labs were normal.  Sent to Dr. Eden Emms --by the time she saw him, her energy was improving.  Had echo:  Impressions: - Normal LV size and systolic function, EF 60-65%. Moderate diastolic dysfunction. Normal RV size and systolic function. No significant valvular abnormalities.  Health Maintenance: Immunization History  Administered Date(s) Administered  . Influenza Split 09/30/2011  . Tdap 10/19/2011   Last Pap smear: UTD, through GYN Last mammogram: 09/2011 at [facility]--due now Last colonoscopy: 2008, scheduled for 12/19/12 with Dr. Ewing Schlein Last DEXA: through GYN, reportedly normal, approx 3 years ago Dentist: twice yearly Ophtho: yearly Exercise: works out with trainer once a week, but inconsistent on other days, some walking.  Hoping to exercise on lunch hour once they get  a gym set up at her new office.  Past Medical History  Diagnosis Date  . Depression     h/o hospitalization '87  . Chronic pain     f/b Dr. Jerene Bears cervical DDD, disk bulge L5-S1  . Chronic constipation   . Hemorrhoids 12/08    internal and external  . Allergic rhinitis, cause unspecified     seasonal  . Melanoma of thigh 1999    R thigh, level 1 (Dr. Terri Piedra)  . C. difficile colitis 1994  . Hyperlipidemia 11/07  . Elevated liver function tests     normal work-up  . Vitamin D deficiency 2010  . Hypertension   . Reflux   . Hiatal hernia     Past Surgical History  Procedure Date  . Colonoscopy 11/2007    Dr. Ewing Schlein; due again 2013  . Shoulder surgery 03-2011    Right; bone spur removal (Dr. Thomasena Edis)  . Excision of melanoma 1999    R thigh  . Breast surgery 1987, 1998    Right breast--removal of benign tumor and benign cyst  . Temporomandibular joint surgery 1987, 1989    artificial disks in TMJ; Dr. Warren Danes  . Dilation and curettage of uterus 2006    bleeding (benign)  . Nasal sinus surgery 1995, 2002    History   Social History  . Marital Status: Single    Spouse Name: N/A    Number of Children: 0  . Years of Education: N/A   Occupational History  .  actuarial analyst    Social History Main Topics  . Smoking status: Never Smoker   . Smokeless tobacco: Never Used  . Alcohol Use: Yes     Comment: 1 glass of wine per year.  . Drug Use: No  . Sexually Active: Not Currently   Other Topics Concern  . Not on file   Social History Narrative   Lives alone, 2 cats    Family History  Problem Relation Age of Onset  . Hypertension Mother   . Allergies Mother   . Hyperlipidemia Mother   . Arthritis Mother   . Melanoma Mother   . Cancer Mother     uterine cancer and melanoma  . Heart disease Mother     atrial fibrillation  . Glaucoma Father   . Depression Father   . Cancer Father     prostate cancer, diagnosed in 60's  . Colon polyps Father     . Cancer Maternal Aunt     breast cancer in 40's  . Heart disease Maternal Aunt   . Heart disease Maternal Uncle   . Heart disease Maternal Uncle   . Stroke Maternal Grandmother   . Stroke Maternal Grandfather   . Diabetes Neg Hx     Current outpatient prescriptions:acetaminophen-codeine (TYLENOL #3) 300-30 MG per tablet, Take 1 tablet by mouth daily.  , Disp: , Rfl: ;  ascorbic acid (VITAMIN C) 500 MG tablet, Take 1,000 mg by mouth daily. , Disp: , Rfl: ;  Calcium Carbonate-Vitamin D (CALCIUM 600+D) 600-400 MG-UNIT per tablet, Take 2 tablets by mouth daily.  , Disp: , Rfl: ;  docusate sodium (COLACE) 100 MG capsule, Take 100-200 mg by mouth daily.  , Disp: , Rfl:  fish oil-omega-3 fatty acids 1000 MG capsule, Take 2 g by mouth daily. , Disp: , Rfl: ;  ibuprofen (ADVIL) 200 MG tablet, Take 400 mg by mouth every 8 (eight) hours as needed., Disp: , Rfl: ;  Magnesium 250 MG TABS, Take 1 tablet by mouth daily.  , Disp: , Rfl: ;  methocarbamol (ROBAXIN) 500 MG tablet, Take 250 mg by mouth as needed., Disp: , Rfl:  metoprolol tartrate (LOPRESSOR) 25 MG tablet, Take 1 tablet (25 mg total) by mouth 2 (two) times daily., Disp: 180 tablet, Rfl: 1;  Misc Natural Products (OSTEO BI-FLEX JOINT SHIELD PO), Take 1 tablet by mouth daily., Disp: , Rfl: ;  Multiple Vitamin (MULTIVITAMIN) tablet, Take 1 tablet by mouth daily.  , Disp: , Rfl: ;  sennosides-docusate sodium (SENOKOT-S) 8.6-50 MG tablet, Take 1 tablet by mouth daily., Disp: , Rfl:  sertraline (ZOLOFT) 100 MG tablet, Take 1 tablet (100 mg total) by mouth daily., Disp: 90 tablet, Rfl: 3;  vitamin B-12 (CYANOCOBALAMIN) 1000 MCG tablet, Take 1,000 mcg by mouth daily.  , Disp: , Rfl: ;  [DISCONTINUED] metoprolol tartrate (LOPRESSOR) 25 MG tablet, Take 1 tablet (25 mg total) by mouth 2 (two) times daily., Disp: 180 tablet, Rfl: 1 [DISCONTINUED] sertraline (ZOLOFT) 100 MG tablet, TAKE ONE TABLET BY MOUTH ONCE A DAY., Disp: 90 tablet, Rfl: 0;  fluticasone  (FLONASE) 50 MCG/ACT nasal spray, 2 sprays by Nasal route as needed.  , Disp: , Rfl: ;  gabapentin (NEURONTIN) 300 MG capsule, Take 300 mg by mouth at bedtime., Disp: , Rfl:   Allergies  Allergen Reactions  . Darvocet (Propoxyphene-Acetaminophen) Other (See Comments)    Faint, blood pressure goes sky high.  . Etodolac Other (See Comments)    Red face, arms-looked like bad  sunburn.  . Septra (Bactrim) Hives   ROS:  The patient denies anorexia, fever, headaches,  vision changes, decreased hearing, ear pain, sore throat, breast concerns, chest pain, palpitations, dizziness, syncope, dyspnea on exertion, cough, swelling, nausea, vomiting, diarrhea, constipation (controlled with current regimen), abdominal pain, melena, hematochezia, indigestion/heartburn, hematuria, incontinence, dysuria, vaginal bleeding, discharge, odor or itch, genital lesions, joint pains, numbness, tingling, weakness, tremor, suspicious skin lesions, depression, anxiety, abnormal bleeding/bruising, or enlarged lymph nodes. +weight gain 10 pounds in last year, when she decreased her exercise frequency; stable recently. +fatigue.  Muscular aches and pains, and some plantar fasciitis (has seen podiatrist, has had shots in past)   PHYSICAL EXAM:  BP 130/80  Pulse 68  Ht 5' 6.75" (1.695 m)  Wt 170 lb (77.111 kg)  BMI 26.83 kg/m2 128/72 on repeat by MD [facility] General Appearance:    Alert, cooperative, no distress, appears stated age  Head:    Normocephalic, without obvious abnormality, atraumatic  Eyes:    PERRL, conjunctiva/corneas clear, EOM's intact, fundi    benign  Ears:    Normal TM's and external ear canals  Nose:   Nares normal, mucosa normal, no drainage or sinus   tenderness  Throat:   Lips, mucosa, and tongue normal; teeth and gums normal  Neck:   Supple, no lymphadenopathy;  thyroid:  no   enlargement/tenderness/nodules; no carotid   bruit or JVD  Back:    Spine nontender, no curvature, ROM normal, no CVA      tenderness  Lungs:     Clear to auscultation bilaterally without wheezes, rales or     ronchi; respirations unlabored  Chest Wall:    No tenderness or deformity   Heart:    Regular rate and rhythm, S1 and S2 normal, no murmur, rub   or gallop  Breast Exam:    Deferred to GYN  Abdomen:     Soft, non-tender, nondistended, normoactive bowel sounds,    no masses, no hepatosplenomegaly  Genitalia:    Deferred to GYN     Extremities:   No clubbing, cyanosis or edema  Pulses:   2+ and symmetric all extremities  Skin:   Skin color, texture, turgor normal, no rashes or lesions  Lymph nodes:   Cervical, supraclavicular, and axillary nodes normal  Neurologic:   CNII-XII intact, normal strength, sensation and gait; reflexes 2+ and symmetric throughout          Psych:   Normal mood, affect, hygiene and grooming.    Lab Results  Component Value Date   HGBA1C 5.8* 10/17/2012   Glucose 85 (normal is <100)  Lab Results  Component Value Date   CHOL 184 10/17/2012   HDL 58 10/17/2012   LDLCALC 111* 10/17/2012   TRIG 77 10/17/2012   CHOLHDL 3.2 10/17/2012   Lab Results  Component Value Date   ALT 37* 10/17/2012   AST 32 10/17/2012   ALKPHOS 91 10/17/2012   BILITOT 0.5 10/17/2012     ASSESSMENT/PLAN:  1. Routine general medical examination at a health care facility  POCT Urinalysis Dipstick  2. Essential hypertension, benign  metoprolol tartrate (LOPRESSOR) 25 MG tablet  3. GERD (gastroesophageal reflux disease)    4. Pure hypercholesterolemia    5. Depressive disorder, not elsewhere classified  sertraline (ZOLOFT) 100 MG tablet  6. Need for prophylactic vaccination and inoculation against influenza  Flu vaccine greater than or equal to 3yo preservative free IM   Discussed monthly self breast exams and yearly mammograms after the  age of 13; at least 30 minutes of aerobic activity at least 5 days/week; proper sunscreen use reviewed; healthy diet, including goals of calcium and vitamin D  intake and alcohol recommendations (less than or equal to 1 drink/day) reviewed; regular seatbelt use; changing batteries in smoke detectors.  Immunization recommendations discussed.  Colonoscopy recommendations reviewed, scheduled.  Reviewed risks and benefits of zostavax.  To check with her insurance, and if covered, return for nurse visit. Pneumovax age 69 Flu shot today

## 2012-11-20 HISTORY — PX: ESOPHAGOGASTRODUODENOSCOPY: SHX1529

## 2012-11-20 LAB — HM MAMMOGRAPHY: HM Mammogram: NORMAL

## 2012-11-20 LAB — HM COLONOSCOPY: HM Colonoscopy: NORMAL

## 2012-12-09 ENCOUNTER — Other Ambulatory Visit (INDEPENDENT_AMBULATORY_CARE_PROVIDER_SITE_OTHER): Payer: BC Managed Care – PPO

## 2012-12-09 DIAGNOSIS — Z2911 Encounter for prophylactic immunotherapy for respiratory syncytial virus (RSV): Secondary | ICD-10-CM

## 2012-12-09 DIAGNOSIS — Z23 Encounter for immunization: Secondary | ICD-10-CM

## 2012-12-19 ENCOUNTER — Other Ambulatory Visit: Payer: Self-pay | Admitting: Gastroenterology

## 2013-04-26 ENCOUNTER — Encounter: Payer: Self-pay | Admitting: *Deleted

## 2013-04-27 ENCOUNTER — Encounter: Payer: Self-pay | Admitting: Family Medicine

## 2013-04-27 ENCOUNTER — Ambulatory Visit (INDEPENDENT_AMBULATORY_CARE_PROVIDER_SITE_OTHER): Payer: BC Managed Care – PPO | Admitting: Family Medicine

## 2013-04-27 VITALS — BP 134/70 | HR 68 | Ht 66.75 in | Wt 176.0 lb

## 2013-04-27 DIAGNOSIS — I10 Essential (primary) hypertension: Secondary | ICD-10-CM

## 2013-04-27 DIAGNOSIS — R7989 Other specified abnormal findings of blood chemistry: Secondary | ICD-10-CM

## 2013-04-27 LAB — COMPREHENSIVE METABOLIC PANEL
ALT: 39 U/L — ABNORMAL HIGH (ref 0–35)
BUN: 13 mg/dL (ref 6–23)
CO2: 26 mEq/L (ref 19–32)
Calcium: 9.8 mg/dL (ref 8.4–10.5)
Chloride: 104 mEq/L (ref 96–112)
Creat: 0.71 mg/dL (ref 0.50–1.10)
Glucose, Bld: 106 mg/dL — ABNORMAL HIGH (ref 70–99)

## 2013-04-27 MED ORDER — METOPROLOL TARTRATE 25 MG PO TABS: 25.0000 mg | ORAL_TABLET | Freq: Two times a day (BID) | ORAL | Status: DC

## 2013-04-27 NOTE — Patient Instructions (Signed)
Continue your current medications. Periodically check your blood pressure. Don't worry about the liver tests--we are repeating today, but they have been at that level (and higher) for years.

## 2013-04-27 NOTE — Progress Notes (Signed)
Chief Complaint  Patient presents with  . Hypertension    6 month follow up.   Hypertension follow-up:  Blood pressures elsewhere have been high (144?) when she checks at work.  Hasn't been checking at home recently.  Denies dizziness, headaches, chest pain.  Denies side effects of medications.  She brings in copies of bloodwork done through work last week. Chem panel was normal except LDH slightly elevated at 234, ALT slightly elevated at 37, GGT 61 (normal <60).  Lipids--chol 199, HDL 64, LDL 117, ratio 3.1 She is concerned about the elevation in her liver tests, since she has been told they were high in the past.  Chart was reviewed--noted to have intermittent mildly elevated LFT's x years.  She has had u/s showing some fatty infiltration.  Old chart not available to review lab workup done--pt doesn't know if Hep C every checked (baby boomer, now recommended all be screened for Hep C).  GERD--previously took Aciphex, now taking Zegerid OTC. She previously saw Dr. Ewing Schlein yearly.  Denies any symptoms of reflux or dysphagia while taking meds.  Past Medical History  Diagnosis Date  . Depression     h/o hospitalization '87  . Chronic pain     f/b Dr. Jerene Bears cervical DDD, disk bulge L5-S1  . Chronic constipation   . Hemorrhoids 12/08    internal and external  . Allergic rhinitis, cause unspecified     seasonal  . Melanoma of thigh 1999    R thigh, level 1 (Dr. Terri Piedra)  . C. difficile colitis 1994  . Hyperlipidemia 11/07    good HDL  . Elevated liver function tests     normal work-up; some fatty liver on u/s.  mild elevations intermittently  . Vitamin D deficiency 2010  . Hypertension   . Reflux   . Hiatal hernia    Past Surgical History  Procedure Laterality Date  . Colonoscopy  11/2007    Dr. Ewing Schlein; due again 2013  . Shoulder surgery  03-2011    Right; bone spur removal (Dr. Thomasena Edis)  . Excision of melanoma  1999    R thigh  . Breast surgery  1987, 1998    Right  breast--removal of benign tumor and benign cyst  . Temporomandibular joint surgery  1987, 1989    artificial disks in TMJ; Dr. Warren Danes  . Dilation and curettage of uterus  2006    bleeding (benign)  . Nasal sinus surgery  1995, 2002   History   Social History  . Marital Status: Single    Spouse Name: N/A    Number of Children: 0  . Years of Education: N/A   Occupational History  . actuarial analyst    Social History Main Topics  . Smoking status: Never Smoker   . Smokeless tobacco: Never Used  . Alcohol Use: Yes     Comment: 1 glass of wine per year.  . Drug Use: No  . Sexually Active: Not Currently   Other Topics Concern  . Not on file   Social History Narrative   Lives alone, 2 cats   Current Outpatient Prescriptions on File Prior to Visit  Medication Sig Dispense Refill  . acetaminophen-codeine (TYLENOL #3) 300-30 MG per tablet Take 1 tablet by mouth daily.        Marland Kitchen ascorbic acid (VITAMIN C) 500 MG tablet Take 1,000 mg by mouth daily.       . Calcium Carbonate-Vitamin D (CALCIUM 600+D) 600-400 MG-UNIT per tablet Take 2 tablets by  mouth daily.        Marland Kitchen docusate sodium (COLACE) 100 MG capsule Take 100-200 mg by mouth daily.        . fish oil-omega-3 fatty acids 1000 MG capsule Take 2 g by mouth daily.       Marland Kitchen ibuprofen (ADVIL) 200 MG tablet Take 400 mg by mouth every 8 (eight) hours as needed.      . Magnesium 250 MG TABS Take 1 tablet by mouth daily.        . Misc Natural Products (OSTEO BI-FLEX JOINT SHIELD PO) Take 1 tablet by mouth daily.      . Multiple Vitamin (MULTIVITAMIN) tablet Take 1 tablet by mouth daily.        . sertraline (ZOLOFT) 100 MG tablet Take 1 tablet (100 mg total) by mouth daily.  90 tablet  3  . vitamin B-12 (CYANOCOBALAMIN) 1000 MCG tablet Take 1,000 mcg by mouth daily.        . fluticasone (FLONASE) 50 MCG/ACT nasal spray 2 sprays by Nasal route as needed.        . gabapentin (NEURONTIN) 300 MG capsule Take 300 mg by mouth at bedtime.       . methocarbamol (ROBAXIN) 500 MG tablet Take 250 mg by mouth as needed.      . sennosides-docusate sodium (SENOKOT-S) 8.6-50 MG tablet Take 1 tablet by mouth daily.       No current facility-administered medications on file prior to visit.   Allergies  Allergen Reactions  . Darvocet (Propoxyphene-Acetaminophen) Other (See Comments)    Faint, blood pressure goes sky high.  . Etodolac Other (See Comments)    Red face, arms-looked like bad sunburn.  . Septra (Bactrim) Hives   ROS: denies fevers, URI symptoms, chest pain, shortness of breath, nausea, vomiting, dysphagia, bowel changes, weight changes, bleeding/bruising, skin rashes or other concerns. Moods stable. Energy has been okay recently, usually worse in the heat of the summer with higher humidity  PHYSICAL EXAM: BP 134/86  Pulse 68  Ht 5' 6.75" (1.695 m)  Wt 176 lb (79.833 kg)  BMI 27.79 kg/m2 134/70 on repeat by MD in [facility] Well developed, pleasant female in no distress Neck: no lymphadenopathy, thyromegaly or mass Heart: 2/6 SEM loudest at RUSB Lungs: clear bilaterally Abdomen: soft, nontender, no organomegaly or mass Extremities: no edema Psych: normal mood, affect, hygiene and grooming Skin: no rash Neuro: alert and oriented, normal gait, cranial nerves intact.  ASSESSMENT/PLAN: Essential hypertension, benign - Plan: Comprehensive metabolic panel, metoprolol tartrate (LOPRESSOR) 25 MG tablet  Elevated LFTs - Plan: Hepatitis C Antibody  HTN--controlled Dr. Ewing Schlein to review results.  Stable overall. Check Hep C Ab due to her age/baby boomer.  Review of chart shows abdominal u/s down in 2008, showing some fatty infiltration. Reassured.  Due for chem panel (yearly for HTN)  H/o hyperlipidemia--reassured that her lipids are at goal (LDL<130, good HDL, ratio <4).  F/u 6 months for CPE/med check

## 2013-04-28 LAB — HEPATITIS C ANTIBODY: HCV Ab: NEGATIVE

## 2013-05-02 ENCOUNTER — Encounter: Payer: Self-pay | Admitting: Family Medicine

## 2013-06-14 LAB — HM DEXA SCAN: HM Dexa Scan: NORMAL

## 2013-09-21 ENCOUNTER — Ambulatory Visit: Payer: BC Managed Care – PPO | Admitting: Podiatry

## 2013-10-02 ENCOUNTER — Encounter: Payer: Self-pay | Admitting: Podiatry

## 2013-10-02 ENCOUNTER — Ambulatory Visit (INDEPENDENT_AMBULATORY_CARE_PROVIDER_SITE_OTHER): Payer: BC Managed Care – PPO | Admitting: Podiatry

## 2013-10-02 VITALS — BP 140/72 | HR 55 | Resp 16

## 2013-10-02 DIAGNOSIS — M722 Plantar fascial fibromatosis: Secondary | ICD-10-CM

## 2013-10-02 NOTE — Progress Notes (Signed)
Subjective:     Patient ID: Amanda Baldwin, female   DOB: 06-05-1956, 57 y.o.   MRN: [id number]  HPI patient states I'm doing better but still having pain at times. Also complaining of pain in the outside of her left Achilles area and fatigue when she's on her foot too much   Review of Systems  All other systems reviewed and are negative.       Objective:   Physical Exam  Nursing note and vitals reviewed. Cardiovascular: Intact distal pulses.   Musculoskeletal: Normal range of motion.  Neurological: She is alert.  Skin: Skin is warm.   pain to palpation of the heel reveals mild to moderate pain at the current time. Mild Achilles tendinitis noted bilateral.      Assessment:     Chronic plantar fasciitis with compensation into the Achilles tendon left over right    Plan:     Educated patient on chronic nature of her particular plantar fasciitis. Discussed physical therapy and supportive shoe gear usage. Scanned for custom orthotics to reduce stress against her feet for the long term.

## 2013-10-02 NOTE — Patient Instructions (Signed)
We will contact you when orthotics are available 

## 2013-10-16 ENCOUNTER — Other Ambulatory Visit: Payer: Self-pay | Admitting: Podiatry

## 2013-10-16 NOTE — Telephone Encounter (Signed)
Refill request

## 2013-10-26 ENCOUNTER — Other Ambulatory Visit: Payer: Self-pay

## 2013-10-26 ENCOUNTER — Ambulatory Visit (INDEPENDENT_AMBULATORY_CARE_PROVIDER_SITE_OTHER): Payer: BC Managed Care – PPO | Admitting: Podiatry

## 2013-10-26 DIAGNOSIS — M722 Plantar fascial fibromatosis: Secondary | ICD-10-CM

## 2013-10-26 DIAGNOSIS — M7660 Achilles tendinitis, unspecified leg: Secondary | ICD-10-CM

## 2013-10-26 NOTE — Patient Instructions (Signed)

## 2013-10-26 NOTE — Progress Notes (Signed)
Subjective:     Patient ID: Amanda Baldwin, female   DOB: 1956/12/05, 57 y.o.   MRN: [id number]  HPI patient presents stating I am ready for these orthotics. States that my feet have still been hurting me but improved   Review of Systems     Objective:   Physical Exam  Constitutional: She is oriented to person, place, and time.  Cardiovascular: Intact distal pulses.   Musculoskeletal: Normal range of motion.  Neurological: She is oriented to person, place, and time.  Skin: Skin is warm.   patient is found to have discomfort still noted mostly on the outside of the heel and also a small amount around the lateral Achilles tendon area     Assessment:     Plantar fasciitis medial improved with probable compensation lateral and into the Achilles tendon    Plan:     Dispensed orthotics with instructions and reviewed physical therapy and stretching exercises for Achilles tendinitis a second orthotics for different shoes and reappoint in 4 weeks

## 2013-11-08 ENCOUNTER — Ambulatory Visit (INDEPENDENT_AMBULATORY_CARE_PROVIDER_SITE_OTHER): Payer: BC Managed Care – PPO | Admitting: Family Medicine

## 2013-11-08 ENCOUNTER — Encounter: Payer: Self-pay | Admitting: Family Medicine

## 2013-11-08 VITALS — BP 130/76 | HR 60 | Ht 66.0 in | Wt 173.0 lb

## 2013-11-08 DIAGNOSIS — R5381 Other malaise: Secondary | ICD-10-CM | POA: Insufficient documentation

## 2013-11-08 DIAGNOSIS — Z0000 Encounter for general adult medical examination without abnormal findings: Secondary | ICD-10-CM

## 2013-11-08 DIAGNOSIS — K219 Gastro-esophageal reflux disease without esophagitis: Secondary | ICD-10-CM

## 2013-11-08 DIAGNOSIS — R635 Abnormal weight gain: Secondary | ICD-10-CM

## 2013-11-08 DIAGNOSIS — M722 Plantar fascial fibromatosis: Secondary | ICD-10-CM | POA: Insufficient documentation

## 2013-11-08 DIAGNOSIS — R7301 Impaired fasting glucose: Secondary | ICD-10-CM

## 2013-11-08 DIAGNOSIS — E7800 Pure hypercholesterolemia, unspecified: Secondary | ICD-10-CM

## 2013-11-08 DIAGNOSIS — M25579 Pain in unspecified ankle and joints of unspecified foot: Secondary | ICD-10-CM

## 2013-11-08 DIAGNOSIS — F3289 Other specified depressive episodes: Secondary | ICD-10-CM

## 2013-11-08 DIAGNOSIS — F329 Major depressive disorder, single episode, unspecified: Secondary | ICD-10-CM

## 2013-11-08 DIAGNOSIS — I10 Essential (primary) hypertension: Secondary | ICD-10-CM

## 2013-11-08 LAB — CBC WITH DIFFERENTIAL/PLATELET
Basophils Absolute: 0 10*3/uL (ref 0.0–0.1)
Eosinophils Relative: 3 % (ref 0–5)
HCT: 39.6 % (ref 36.0–46.0)
Hemoglobin: 14 g/dL (ref 12.0–15.0)
Lymphocytes Relative: 41 % (ref 12–46)
Lymphs Abs: 1.7 10*3/uL (ref 0.7–4.0)
MCV: 91.7 fL (ref 78.0–100.0)
Monocytes Absolute: 0.4 10*3/uL (ref 0.1–1.0)
Monocytes Relative: 9 % (ref 3–12)
Neutro Abs: 1.9 10*3/uL (ref 1.7–7.7)
Neutrophils Relative %: 46 % (ref 43–77)
RBC: 4.32 MIL/uL (ref 3.87–5.11)
RDW: 13.4 % (ref 11.5–15.5)
WBC: 4.1 10*3/uL (ref 4.0–10.5)

## 2013-11-08 LAB — POCT URINALYSIS DIPSTICK
Bilirubin, UA: NEGATIVE
Blood, UA: NEGATIVE
Glucose, UA: NEGATIVE
Nitrite, UA: NEGATIVE
Protein, UA: NEGATIVE
Urobilinogen, UA: NEGATIVE

## 2013-11-08 LAB — COMPREHENSIVE METABOLIC PANEL
Albumin: 4.5 g/dL (ref 3.5–5.2)
BUN: 11 mg/dL (ref 6–23)
CO2: 25 mEq/L (ref 19–32)
Calcium: 9.6 mg/dL (ref 8.4–10.5)
Chloride: 102 mEq/L (ref 96–112)
Creat: 0.73 mg/dL (ref 0.50–1.10)
Glucose, Bld: 91 mg/dL (ref 70–99)
Total Bilirubin: 0.5 mg/dL (ref 0.3–1.2)

## 2013-11-08 LAB — LIPID PANEL
Cholesterol: 192 mg/dL (ref 0–200)
HDL: 62 mg/dL (ref >39)
LDL Cholesterol: 108 mg/dL — ABNORMAL HIGH (ref 0–99)
Triglycerides: 112 mg/dL (ref <150)

## 2013-11-08 LAB — TSH: TSH: 1.14 u[IU]/mL (ref 0.350–4.500)

## 2013-11-08 LAB — HEMOGLOBIN A1C
Hgb A1c MFr Bld: 5.5 % (ref <5.7)
Mean Plasma Glucose: 111 mg/dL (ref <117)

## 2013-11-08 MED ORDER — SERTRALINE HCL 100 MG PO TABS: 100.0000 mg | ORAL_TABLET | Freq: Every day | ORAL | Status: DC

## 2013-11-08 NOTE — Patient Instructions (Signed)

## 2013-11-08 NOTE — Progress Notes (Signed)
Chief Complaint  Patient presents with  . Annual Exam    fasting annual exam, no gyn care sees Dr. Juliene Pina and is UTD. Did not eye exam as she has appt scheduled with Dr.Miller.   Amanda Baldwin is a 57 y.o. female who presents for a complete physical and follow-up on chronic medical problems.   She sees NP at her job [employer]) --she was treated for cat bite to her left calf (resolved, slight firm area remains), got her flu shot, and she can get BP meds for free through that office.  Plantar Fasciitis--she is seeing Dr. Charlsie Merles.  She was given meloxicam by Dr. Charlsie Merles, but wasn't aware that she wasn't also supposed to take advil (but didn't need it as much, just sometimes just at the end of the day).  She took the mobic just for one month, doesn't have refills.  She has orthotics now.  She sometimes also has pain at the back of her heel, and lateral foot which she hasn't really had evaluated by him yet.    Depression: Doing well on Zoloft, moods good. Has had some fatigue, but stable/unchanged.  Not as severe as it has been in the past. No known snoring. Doesn't fall asleep while driving.  When she was here 05/2012 she was having much worse symptoms of fatigue, where she felt like she could "crumble". Labs were normal. Sent to Dr. Eden Emms --by the time she saw him, her energy was improving. Had echo:  Impressions: - Normal LV size and systolic function, EF 60-65%. Moderate diastolic dysfunction. Normal RV size and systolic function. No significant valvular abnormalities.  HTN--BP's haven't been checked recently.  Not checked with NP recently when getting her refill.  Denies headaches, dizziness, chest pain, palpitations, edema.   Hyperlipidemia--previously took meds, but has been off lipid-lowering meds for years, and numbers have been okay, with good HDL, and LDL has been okay.  She is fasting for labs today.  GERD--well controlled on the Zegerid.  She previously took OTC, but wasn't doing as  well.  Dr. Ewing Schlein changed her to Rx strength at her last visit and is doing better.  Denies dysphagia.  Immunization History  Administered Date(s) Administered  . Influenza Split 09/30/2011, 10/28/2012, 09/29/2013  . Tdap 10/19/2011  . Zoster 12/09/2012   Last Pap smear: UTD, through GYN  Last mamm[facility]: 11/2012 at [facility] Last colonoscopy: 11/2012 with Dr. Ewing Schlein; no result received from [place].  She believes she is due again in 5 years. Last DEXA: through GYN, reportedly normal, approx 4 years ago and done again last year and reportedly normal Dentist: twice yearly  Ophtho: yearly  Exercise: works out with trainer once a week, but not much else.    Past Medical History  Diagnosis Date  . Depression     h/o hospitalization '87  . Chronic pain     f/b Dr. Jerene Bears cervical DDD, disk bulge L5-S1  . Chronic constipation   . Hemorrhoids 12/08    internal and external  . Allergic rhinitis, cause unspecified     seasonal  . Melanoma of thigh 1999    R thigh, level 1 (Dr. Terri Piedra)  . C. difficile colitis 1994  . Hyperlipidemia 11/07    good HDL  . Elevated liver function tests     normal work-up; some fatty liver on u/s.  mild elevations intermittently  . Vitamin D deficiency 2010  . Hypertension   . Reflux   . Hiatal hernia   . Plantar  fasciitis, bilateral     L worse than R; Dr. Charlsie Merles    Past Surgical History  Procedure Laterality Date  . Colonoscopy  11/2007, 11/2012    Dr. Ewing Schlein  . Shoulder surgery  03-2011    Right; bone spur removal (Dr. Thomasena Edis)  . Excision of melanoma  1999    R thigh  . Breast surgery  1987, 1998    Right breast--removal of benign tumor and benign cyst  . Temporomandibular joint surgery  1987, 1989    artificial disks in TMJ; Dr. Warren Danes  . Dilation and curettage of uterus  2006    bleeding (benign)  . Nasal sinus surgery  1995, 2002    History   Social History  . Marital Status: Single    Spouse Name: N/A    Number of Children:  0  . Years of Education: N/A   Occupational History  . actuarial analyst    Social History Main Topics  . Smoking status: Never Smoker   . Smokeless tobacco: Never Used  . Alcohol Use: Yes     Comment: 1 glass of wine per year.  . Drug Use: No  . Sexual Activity: Not Currently   Other Topics Concern  . Not on file   Social History Narrative   Lives alone, 2 cats    Family History  Problem Relation Age of Onset  . Hypertension Mother   . Allergies Mother   . Hyperlipidemia Mother   . Arthritis Mother   . Melanoma Mother   . Cancer Mother     uterine cancer and melanoma  . Heart disease Mother     atrial fibrillation  . Glaucoma Father   . Depression Father   . Cancer Father     prostate cancer, diagnosed in 22's  . Colon polyps Father   . Cancer Maternal Aunt     breast cancer in 40's  . Heart disease Maternal Aunt   . Heart disease Maternal Uncle   . Heart disease Maternal Uncle   . Stroke Maternal Grandmother   . Stroke Maternal Grandfather   . Diabetes Neg Hx   . Glaucoma Sister     Current outpatient prescriptions:acetaminophen-codeine (TYLENOL #3) 300-30 MG per tablet, Take 1 tablet by mouth daily.  , Disp: , Rfl: ;  ascorbic acid (VITAMIN C) 500 MG tablet, Take 1,000 mg by mouth daily. , Disp: , Rfl: ;  Calcium Carbonate-Vitamin D (CALCIUM 600+D) 600-400 MG-UNIT per tablet, Take 2 tablets by mouth daily.  , Disp: , Rfl: ;  docusate sodium (COLACE) 100 MG capsule, Take 100-200 mg by mouth daily.  , Disp: , Rfl:  fish oil-omega-3 fatty acids 1000 MG capsule, Take 2 g by mouth daily. , Disp: , Rfl: ;  ibuprofen (ADVIL) 200 MG tablet, Take 400 mg by mouth every 8 (eight) hours as needed., Disp: , Rfl: ;  Magnesium 250 MG TABS, Take 1 tablet by mouth daily.  , Disp: , Rfl: ;  metoprolol succinate (TOPROL-XL) 50 MG 24 hr tablet, Take 50 mg by mouth daily. Take with or immediately following a meal., Disp: , Rfl:  Misc Natural Products (OSTEO BI-FLEX JOINT SHIELD PO),  Take 1 tablet by mouth daily., Disp: , Rfl: ;  Multiple Vitamin (MULTIVITAMIN) tablet, Take 1 tablet by mouth daily.  , Disp: , Rfl: ;  omeprazole-sodium bicarbonate (ZEGERID) 40-1100 MG per capsule, Take 1 capsule by mouth daily before breakfast. , Disp: , Rfl: ;  sennosides-docusate sodium (SENOKOT-S) 8.6-50 MG  tablet, Take 1 tablet by mouth daily., Disp: , Rfl:  sertraline (ZOLOFT) 100 MG tablet, Take 1 tablet (100 mg total) by mouth daily., Disp: 90 tablet, Rfl: 3;  vitamin B-12 (CYANOCOBALAMIN) 1000 MCG tablet, Take 1,000 mcg by mouth daily.  , Disp: , Rfl: ;  fluticasone (FLONASE) 50 MCG/ACT nasal spray, 2 sprays by Nasal route as needed.  , Disp: , Rfl: ;  methocarbamol (ROBAXIN) 500 MG tablet, Take 250 mg by mouth as needed., Disp: , Rfl:   Allergies  Allergen Reactions  . Darvocet [Propoxyphene-Acetaminophen] Other (See Comments)    Faint, blood pressure goes sky high.  . Etodolac Other (See Comments)    Red face, arms-looked like bad sunburn.  . Septra [Bactrim] Hives   ROS: The patient denies anorexia, fever, weight changes, vision changes, decreased hearing, ear pain, sore throat, breast concerns, chest pain, palpitations, dizziness, syncope, dyspnea on exertion, cough, swelling, nausea, vomiting, diarrhea, constipation (controlled with current regimen), abdominal pain, melena, hematochezia, indigestion/heartburn, hematuria, incontinence, dysuria, vaginal bleeding, discharge, odor or itch, genital lesions, numbness, tingling, weakness, tremor, suspicious skin lesions, depression, anxiety, abnormal bleeding/bruising, or enlarged lymph nodes.   +fatigue (unchanged). Muscular aches and pains, and plantar fasciitis., lateral and posterior heel pain She gets "stress headaches" at work, mild, relieved by the advil that she takes for her other pains.  PHYSICAL EXAM:  BP 130/76  Pulse 60  Ht 5\' 6"  (1.676 m)  Wt 173 lb (78.472 kg)  BMI 27.94 kg/m2  General Appearance:  Alert, cooperative,  no distress, appears stated age   Head:  Normocephalic, without obvious abnormality, atraumatic   Eyes:  PERRL, conjunctiva/corneas clear, EOM's intact, fundi  benign   Ears:  Normal TM's and external ear canals   Nose:  Nares normal, mucosa normal, no drainage or sinus tenderness   Throat:  Lips, mucosa, and tongue normal; teeth and gums normal   Neck:  Supple, no lymphadenopathy; thyroid: no enlargement/tenderness/nodules; no carotid  bruit or JVD   Back:  Spine nontender, no curvature, ROM normal, no CVA tenderness   Lungs:  Clear to auscultation bilaterally without wheezes, rales or ronchi; respirations unlabored   Chest Wall:  No tenderness or deformity   Heart:  Regular rate and rhythm, S1 and S2 normal, no murmur, rub  or gallop   Breast Exam:  Deferred to GYN   Abdomen:  Soft, non-tender, nondistended, normoactive bowel sounds,  no masses, no hepatosplenomegaly   Genitalia:  Deferred to GYN      Extremities:  No clubbing, cyanosis or edema; nontender at plantar fascia insertion and arch.  Area of recent discomfort is posterior/inferior to lateral malleolus  Pulses:  2+ and symmetric all extremities   Skin:  Skin color, texture, turgor normal, no rashes or lesions   Lymph nodes:  Cervical, supraclavicular, and axillary nodes normal   Neurologic:  CNII-XII intact, normal strength, sensation and gait; reflexes 2+ and symmetric throughout          Psych: Normal mood, affect, hygiene and grooming.    ASSESSMENT/PLAN:  Routine general medical examination at a health care facility - Plan: POCT Urinalysis Dipstick  GERD (gastroesophageal reflux disease) - controlled  Essential hypertension, benign - controlled - Plan: Lipid panel, Comprehensive metabolic panel  Impaired fasting glucose - Plan: Comprehensive metabolic panel, Hemoglobin A1c  Other malaise and fatigue - Plan: Comprehensive metabolic panel, CBC with Differential, TSH  Weight gain - Plan: TSH  Plantar fasciitis -  improved with orthotics and course of NSAIDs  Depressive disorder, not elsewhere classified - controlled - Plan: sertraline (ZOLOFT) 100 MG tablet  Pure hypercholesterolemia - Plan: Lipid panel  Pain in joint, ankle and foot, unspecified laterality - follow up with Dr. Charlsie Merles as scheduled.  consider repeat course of Mobic, but NSAID precautions reviewed  F/u with podiatrist as scheduled. Reviewed NSAID precautions (if mobic is refilled)  Discussed monthly self breast exams and yearly mammograms after the age of 55; at least 30 minutes of aerobic activity at least 5 days/week; proper sunscreen use reviewed; healthy diet, including goals of calcium and vitamin D intake and alcohol recommendations (less than or equal to 1 drink/day) reviewed; regular seatbelt use; changing batteries in smoke detectors. Immunization recommendations discussed, UTD. Colonoscopy recommendations reviewed--UTD  Pneumovax age 76

## 2013-11-23 ENCOUNTER — Ambulatory Visit (INDEPENDENT_AMBULATORY_CARE_PROVIDER_SITE_OTHER): Payer: BC Managed Care – PPO | Admitting: Podiatry

## 2013-11-23 ENCOUNTER — Encounter: Payer: Self-pay | Admitting: Podiatry

## 2013-11-23 VITALS — BP 126/60 | HR 66 | Resp 16

## 2013-11-23 DIAGNOSIS — M7660 Achilles tendinitis, unspecified leg: Secondary | ICD-10-CM

## 2013-11-23 DIAGNOSIS — M722 Plantar fascial fibromatosis: Secondary | ICD-10-CM

## 2013-11-23 MED ORDER — MELOXICAM 15 MG PO TABS: 15.0000 mg | ORAL_TABLET | Freq: Every day | ORAL | Status: DC

## 2013-11-23 MED ORDER — DICLOFENAC SODIUM 75 MG PO TBEC: 75.0000 mg | DELAYED_RELEASE_TABLET | Freq: Two times a day (BID) | ORAL | Status: DC

## 2013-11-23 MED ORDER — TRIAMCINOLONE ACETONIDE 10 MG/ML IJ SUSP
10.0000 mg | Freq: Once | INTRAMUSCULAR | Status: AC
  Administered 2013-11-23: 10 mg

## 2013-11-23 NOTE — Progress Notes (Signed)
Subjective:     Patient ID: Amanda Baldwin, female   DOB: Mar 16, 1956, 57 y.o.   MRN: [id number]  HPI patient states that I'm still getting a lot of pain in the bottom of my left heel and despite orthotics but still gets sore when pressed   Review of Systems     Objective:   Physical Exam Neurovascular status intact and intense discomfort center of the plantar heel left with moderate discomfort also in the Achilles tendon when pressed    Assessment:     Plantar fasciitis medial central and lateral band along with compensation and Achilles tendinitis secondary to change in gait    Plan:     Do to long-term discomfort I have recommended immobilization and dispensed air fracture walker with instructions. Injected the plantar fascia 3 mg Kenalog 5mg  Marcaine mixture to reduce inflammation at this time and continue Mobic 15 mg daily

## 2013-12-11 ENCOUNTER — Encounter: Payer: Self-pay | Admitting: Podiatry

## 2013-12-11 ENCOUNTER — Ambulatory Visit (INDEPENDENT_AMBULATORY_CARE_PROVIDER_SITE_OTHER): Payer: BC Managed Care – PPO | Admitting: Podiatry

## 2013-12-11 VITALS — BP 172/81 | HR 59 | Resp 16

## 2013-12-11 DIAGNOSIS — M722 Plantar fascial fibromatosis: Secondary | ICD-10-CM

## 2013-12-11 NOTE — Progress Notes (Signed)
Subjective:     Patient ID: Amanda Baldwin, female   DOB: December 02, 1956, 57 y.o.   MRN: [id number]  HPI patient presents stating that my heel is improving and gradually getting better   Review of Systems     Objective:   Physical Exam Neurovascular status intact with no health history changes noted and discomfort in the plantar aspect of the heel left which is moderate    Assessment:     Improving plantar fasciitis with boot usage    Plan:     Advised on gradual reduction of the boot at this time with ice therapy and stretching exercises supportive shoes and will be seen back 6 weeks for further decision

## 2014-01-11 ENCOUNTER — Ambulatory Visit (INDEPENDENT_AMBULATORY_CARE_PROVIDER_SITE_OTHER): Payer: BC Managed Care – PPO | Admitting: Podiatry

## 2014-01-11 ENCOUNTER — Encounter: Payer: Self-pay | Admitting: Podiatry

## 2014-01-11 VITALS — BP 127/53 | HR 71 | Resp 16

## 2014-01-11 DIAGNOSIS — M722 Plantar fascial fibromatosis: Secondary | ICD-10-CM

## 2014-01-11 MED ORDER — GABAPENTIN 400 MG PO CAPS: 400.0000 mg | ORAL_CAPSULE | Freq: Two times a day (BID) | ORAL | Status: DC

## 2014-01-11 NOTE — Progress Notes (Signed)
Subjective:     Patient ID: Amanda Baldwin, female   DOB: 10-14-1956, 58 y.o.   MRN: [id number]  HPI patient presents stating that my heel is killing me and I am just getting generalized pain in both of my feet. Stated that she stop wearing the boot and the pain returned   Review of Systems     Objective:   Physical Exam Neurovascular status intact with intense discomfort plantar fascial left at the insertional point of the tendon into the calcaneus and generalized pain into the arch and forefoot of both feet    Assessment:     Plantar fasciitis severe nature left heel and generalized foot pain both feet    Plan:     Reviewed condition and discussed treatment options at this time I do think that shockwave would be her best option for the plantar heel and we discussed this and she is scheduled for 2 weeks for this procedure. Then went ahead and recommended Neurontin to see if this will help her generalized foot pain. Reevaluate 2 weeks'

## 2014-01-29 ENCOUNTER — Ambulatory Visit: Payer: BC Managed Care – PPO | Admitting: Podiatry

## 2014-02-19 ENCOUNTER — Telehealth: Payer: Self-pay | Admitting: Family Medicine
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
## 2014-10-05 ENCOUNTER — Other Ambulatory Visit: Payer: Self-pay

## 2014-11-20 ENCOUNTER — Other Ambulatory Visit: Payer: Self-pay | Admitting: Family Medicine

## 2014-11-20 ENCOUNTER — Other Ambulatory Visit: Payer: Self-pay

## 2014-11-20 NOTE — Telephone Encounter (Signed)
Dr.Knapp is this okay pt last seen 10/2013

## 2014-11-20 NOTE — Telephone Encounter (Signed)
Pt scheduled an appt January 28th for physical

## 2014-11-20 NOTE — Telephone Encounter (Signed)
She was last seen over a year ago and has no upcoming appointment.  Okay to refill #90 but MUST schedule visit (CPE)

## 2015-01-04 LAB — HM MAMMOGRAPHY: HM Mammogram: NEGATIVE

## 2015-01-15 ENCOUNTER — Telehealth: Payer: Self-pay | Admitting: Family Medicine

## 2015-01-15 NOTE — Telephone Encounter (Addendum)
Pt had labs done in December 2015. She faxed those over today and they were placed in Dr Johnsie Kindred folder. Pt wants to know if she will still need to be fasting for labwork at Thursday's appt

## 2015-01-16 NOTE — Telephone Encounter (Signed)
Patient advised.

## 2015-01-16 NOTE — Telephone Encounter (Signed)
Please advise pt--she doesn't need to fast.

## 2015-01-17 ENCOUNTER — Ambulatory Visit (INDEPENDENT_AMBULATORY_CARE_PROVIDER_SITE_OTHER): Payer: BLUE CROSS/BLUE SHIELD | Admitting: Family Medicine

## 2015-01-17 ENCOUNTER — Encounter: Payer: Self-pay | Admitting: Family Medicine

## 2015-01-17 VITALS — BP 128/74 | HR 60 | Ht 67.0 in | Wt 175.0 lb

## 2015-01-17 DIAGNOSIS — Z0000 Encounter for general adult medical examination without abnormal findings: Secondary | ICD-10-CM

## 2015-01-17 DIAGNOSIS — F325 Major depressive disorder, single episode, in full remission: Secondary | ICD-10-CM | POA: Insufficient documentation

## 2015-01-17 DIAGNOSIS — I10 Essential (primary) hypertension: Secondary | ICD-10-CM

## 2015-01-17 DIAGNOSIS — F324 Major depressive disorder, single episode, in partial remission: Secondary | ICD-10-CM

## 2015-01-17 LAB — POCT URINALYSIS DIPSTICK
BILIRUBIN UA: NEGATIVE
Blood, UA: NEGATIVE
GLUCOSE UA: NEGATIVE
Ketones, UA: NEGATIVE
Leukocytes, UA: NEGATIVE
NITRITE UA: NEGATIVE
PROTEIN UA: NEGATIVE
SPEC GRAV UA: 1.01
Urobilinogen, UA: NEGATIVE
pH, UA: 6.5

## 2015-01-17 MED ORDER — SERTRALINE HCL 100 MG PO TABS: ORAL_TABLET | ORAL | Status: DC

## 2015-01-17 NOTE — Patient Instructions (Signed)

## 2015-01-17 NOTE — Progress Notes (Signed)
Chief Complaint  Patient presents with  . Annual Exam    nonfasting annual exam without pap, sees Dr.Mody and is UTD. Did not do eye exam as she just had one with Dr.David Sabra Heck. No concerns. Did bring in results of mammo from [facility] [facility], has question as to what Cat B classification is.    Amanda Baldwin is a 59 y.o. female who presents for a complete physical.  She has the following concerns: No major concerns. Discussed her mammogram results.  She sees NP at her job [name])-- got her flu shot there, and gets her BP meds for free. Plantar Fasciitis--comes and goes, overall improved.  She continues to wear her orthotics.  She is no longer seeing Dr. Paulla Dolly.  Depression: Doing well on Zoloft, moods good. Has had some fatigue, but stable/unchanged--slightly low energy, her new norm. Not as severe as it has been in the past. No known snoring. Doesn't fall asleep while driving.  When she was here 05/2012 she was having much worse symptoms of fatigue, where she felt like she could "crumble". Labs were normal. Sent to Dr. Johnsie Cancel --by the time she saw him, her energy was improving. Had echo which was normal.  She hasn't had any recurrences like that since.  It was in the summer, ?heat related.  HTN--BP's when at other doctors or at NP are always normal--she didn't bring the list.  Denies headaches, dizziness, chest pain, palpitations, edema.  Compliant with medications and denies side effects.  Hyperlipidemia--previously took meds, but has been off lipid-lowering meds for years. She tries to follow a lowfat, low cholesterol diet.  She had labs done prior to visit--see below.  GERD--well controlled on the Zegerid. She previously took OTC, but wasn't doing as well. Dr. Watt Climes changed her to Rx strength for over a year and is doing well. Denies dysphagia, just sometimes swallowing water before she goes to bed.  Chronic pain:  She sees Dr. Nelva Bush for DDD in neck and lumbar spine.  She gets pain meds  from him--only takes 1/day, and gets by with advil the rest of the day.    Immunization History  Administered Date(s) Administered  . Influenza Split 09/30/2011, 10/28/2012, 09/29/2013, 09/20/2014  . Tdap 10/19/2011  . Zoster 12/09/2012   Last Pap smear: UTD, through GYN (yearly visits, pap q3 yrs) Last mammogram: 12/2013 at [facility] colonoscopy: 11/2012 with Dr. Watt Climes; no result received from [facility]. She believes she is due again in 5 years. Last DEXA: through GYN, reportedly normal (done twice, at [facility]). Dentist: twice yearly  Ophtho: yearly  Exercise: works out with trainer once a week (twice a week recently), but will likely stop due to cost.   Past Medical History  Diagnosis Date  . Depression     h/o hospitalization '87  . Chronic pain     f/b Dr. Ramos--multilevel cervical DDD, disk bulge L5-S1  . Chronic constipation   . Hemorrhoids 12/08    internal and external  . Allergic rhinitis, cause unspecified     seasonal  . Melanoma of thigh 1999    R thigh, level 1 (Dr. Allyson Sabal)  . C. difficile colitis 1994  . Hyperlipidemia 11/07    good HDL  . Elevated liver function tests     normal work-up; some fatty liver on u/s.  mild elevations intermittently  . Vitamin D deficiency 2010  . Hypertension   . Reflux   . Hiatal hernia   . Plantar fasciitis, bilateral  L worse than R; Dr. Paulla Dolly   Past Surgical History  Procedure Laterality Date  . Colonoscopy  11/2007, 11/2012    Dr. Watt Climes  . Shoulder surgery  03-2011    Right; bone spur removal (Dr. Theda Sers)  . Excision of melanoma  1999    R thigh  . Breast surgery  1987, 1998    Right breast--removal of benign tumor and benign cyst  . Temporomandibular joint surgery  1987, 1989    artificial disks in TMJ; Dr. Terence Lux  . Dilation and curettage of uterus  2006    bleeding (benign)  . Nasal sinus surgery  1995, 2002  . Esophagogastroduodenoscopy  11/2012    Dr. Watt Climes   History   Social History  . Marital  Status: Single    Spouse Name: N/A    Number of Children: 0  . Years of Education: N/A   Occupational History  . actuarial analyst    Social History Main Topics  . Smoking status: Never Smoker   . Smokeless tobacco: Never Used  . Alcohol Use: Yes     Comment: 1 glass of wine per year.  . Drug Use: No  . Sexual Activity: Not Currently   Other Topics Concern  . Not on file   Social History Narrative   Lives alone, 1 cat   Family History  Problem Relation Age of Onset  . Hypertension Mother   . Allergies Mother   . Hyperlipidemia Mother   . Arthritis Mother   . Melanoma Mother   . Cancer Mother     uterine cancer and melanoma  . Heart disease Mother     atrial fibrillation  . Glaucoma Father   . Depression Father   . Cancer Father     prostate cancer, diagnosed in 72's  . Colon polyps Father   . Cancer Maternal Aunt     breast cancer in 40's  . Heart disease Maternal Aunt   . Heart disease Maternal Uncle   . Heart disease Maternal Uncle   . Stroke Maternal Grandmother   . Stroke Maternal Grandfather   . Diabetes Neg Hx   . Glaucoma Sister    Outpatient Encounter Prescriptions as of 01/17/2015  Medication Sig Note  . acetaminophen-codeine (TYLENOL #3) 300-30 MG per tablet Take 1 tablet by mouth daily.   11/08/2013: Takes it once daily, rx'd by Dr. Nelva Bush, for chronic back pain/arthritis  . ascorbic acid (VITAMIN C) 500 MG tablet Take 1,000 mg by mouth daily.    . Calcium Carbonate-Vitamin D (CALCIUM 600+D) 600-400 MG-UNIT per tablet Take 2 tablets by mouth daily.     Marland Kitchen docusate sodium (COLACE) 100 MG capsule Take 200-300 mg by mouth daily.    . fluticasone (FLONASE) 50 MCG/ACT nasal spray 2 sprays by Nasal route as needed.     Marland Kitchen ibuprofen (ADVIL) 200 MG tablet Take 400 mg by mouth every 8 (eight) hours as needed. 06/01/2012: Takes 2 tablets three times daily every day (6/day)  . Magnesium 250 MG TABS Take 1 tablet by mouth daily.     . metoprolol succinate  (TOPROL-XL) 50 MG 24 hr tablet Take 50 mg by mouth daily. Take with or immediately following a meal.   . Multiple Vitamin (MULTIVITAMIN) tablet Take 1 tablet by mouth daily.     Marland Kitchen omeprazole-sodium bicarbonate (ZEGERID) 40-1100 MG per capsule Take 1 capsule by mouth daily before breakfast.  11/08/2013: rx'd by Dr. Watt Climes  . sennosides-docusate sodium (SENOKOT-S) 8.6-50 MG tablet Take  1 tablet by mouth daily. 01/17/2015: 1-3 tablets 2-3 times per week.   . sertraline (ZOLOFT) 100 MG tablet TAKE 1 TABLET (100 MG TOTAL) BY MOUTH DAILY.   . [DISCONTINUED] sertraline (ZOLOFT) 100 MG tablet TAKE 1 TABLET (100 MG TOTAL) BY MOUTH DAILY.   . fish oil-omega-3 fatty acids 1000 MG capsule Take 2 g by mouth daily.  01/17/2015: Hasn't been taking recently, ran out  . methocarbamol (ROBAXIN) 500 MG tablet Take 250 mg by mouth as needed. 11/08/2013: Rarely needs  . Misc Natural Products (OSTEO BI-FLEX JOINT SHIELD PO) Take 1 tablet by mouth daily. 01/17/2015: Ran out  . vitamin B-12 (CYANOCOBALAMIN) 1000 MCG tablet Take 1,000 mcg by mouth daily.     . [DISCONTINUED] gabapentin (NEURONTIN) 400 MG capsule Take 1 capsule (400 mg total) by mouth 2 (two) times daily. (Patient not taking: Reported on 01/17/2015)   . [DISCONTINUED] meloxicam (MOBIC) 15 MG tablet Take 1 tablet (15 mg total) by mouth daily.    Also taking a topical antifungal medication (keradyn?) on her toes.  Allergies  Allergen Reactions  . Darvocet [Propoxyphene N-Acetaminophen] Other (See Comments)    Faint, blood pressure goes sky high.  . Etodolac Other (See Comments)    Red face, arms-looked like bad sunburn.  . Septra [Bactrim] Hives    ROS: The patient denies anorexia, fever, weight changes, vision changes, decreased hearing, ear pain, sore throat, breast concerns, chest pain, palpitations, dizziness, syncope, dyspnea on exertion, cough, swelling, nausea, vomiting, diarrhea, constipation (controlled with current regimen), abdominal pain, melena,  hematochezia, indigestion/heartburn, hematuria, incontinence, dysuria, vaginal bleeding, discharge, odor or itch, genital lesions, numbness, tingling, weakness, tremor, suspicious skin lesions, depression, anxiety, abnormal bleeding/bruising, or enlarged lymph nodes.  She sees Dr. Sherrie George for skin checks. +fatigue (unchanged).   PHYSICAL EXAM:  BP 128/74 mmHg  Pulse 60  Ht _0  (1.702 m)  Wt 175 lb (79.379 kg)  BMI 27.40 kg/m2  General Appearance:  Alert, cooperative, no distress, appears stated age   Head:  Normocephalic, without obvious abnormality, atraumatic   Eyes:  PERRL, conjunctiva/corneas clear, EOM's intact, fundi  benign   Ears:  Normal TM's and external ear canals   Nose:  Nares normal, mucosa normal, no drainage or sinus tenderness   Throat:  Lips, mucosa, and tongue normal; teeth and gums normal   Neck:  Supple, no lymphadenopathy; thyroid: no enlargement/tenderness/nodules; no carotid  bruit or JVD   Back:  Spine nontender, no curvature, ROM normal, no CVA tenderness   Lungs:  Clear to auscultation bilaterally without wheezes, rales or ronchi; respirations unlabored   Chest Wall:  No tenderness or deformity   Heart:  Regular rate and rhythm, S1 and S2 normal, no murmur, rub  or gallop   Breast Exam:  Deferred to GYN   Abdomen:  Soft, non-tender, nondistended, normoactive bowel sounds,  no masses, no hepatosplenomegaly   Genitalia:  Deferred to GYN      Extremities:  No clubbing, cyanosis or edema;   Pulses:  2+ and symmetric all extremities   Skin:  Skin color, texture, turgor normal, no rashes or lesions. Nodule right lower leg, somewhat recently shaved over.  Consistent with dermatofibroma (has been reassured by derm and derm PA in past)  Lymph nodes:  Cervical, supraclavicular, and axillary nodes normal   Neurologic:  CNII-XII intact, normal strength, sensation and gait; reflexes 2+ and symmetric throughout     Psych:  Normal mood, affect, hygiene and grooming.    Recent labs (  see scanned sheet):  c-met notable for ALT of 49, AST 40, LDH 260, GGT 89 (all mildly high; AST/ALT not significantly changed from last year). Normal glucose. Normal thyroid studies, normal CBC  Total chol 185, LDL 102, HDL 61, TG 111, chol/HDL 3.0   ASSESSMENT/PLAN:   H/o Vit D deficiency, normal on supplements in the past, compliant. Continue. Mild elevated LFT's--reassured, stable overall (she will be sending copies to Dr. Watt Climes also).   Lipids normal HTN--well controlled Depression--briefly discussed potential to cut dose in 1/2 (she asked about)--discussed doing when low stress levels, checking in with confidante to help assess for changes, and go back up to full tablet if any recurrent symptoms. She might start by cutting back slower (alternating 50/100 mg)   Discussed monthly self breast exams and yearly mammograms after the age of 54; at least 30 minutes of aerobic activity at least 5 days/week; proper sunscreen use reviewed; healthy diet, including goals of calcium and vitamin D intake and alcohol recommendations (less than or equal to 1 drink/day) reviewed; regular seatbelt use; changing batteries in smoke detectors. Immunization recommendations discussed, UTD. Colonoscopy recommendations reviewed--UTD

## 2015-01-24 ENCOUNTER — Encounter: Payer: Self-pay | Admitting: Internal Medicine

## 2015-01-28 ENCOUNTER — Encounter: Payer: Self-pay | Admitting: Family Medicine

## 2015-03-04 ENCOUNTER — Other Ambulatory Visit: Payer: Self-pay | Admitting: Family Medicine

## 2015-04-15 ENCOUNTER — Other Ambulatory Visit: Payer: Self-pay | Admitting: Physician Assistant

## 2016-01-20 ENCOUNTER — Ambulatory Visit (INDEPENDENT_AMBULATORY_CARE_PROVIDER_SITE_OTHER): Payer: BLUE CROSS/BLUE SHIELD | Admitting: Family Medicine

## 2016-01-20 ENCOUNTER — Encounter: Payer: Self-pay | Admitting: Family Medicine

## 2016-01-20 VITALS — BP 132/74 | HR 64 | Ht 66.75 in | Wt 183.4 lb

## 2016-01-20 DIAGNOSIS — F325 Major depressive disorder, single episode, in full remission: Secondary | ICD-10-CM

## 2016-01-20 DIAGNOSIS — R140 Abdominal distension (gaseous): Secondary | ICD-10-CM | POA: Diagnosis not present

## 2016-01-20 DIAGNOSIS — E7800 Pure hypercholesterolemia, unspecified: Secondary | ICD-10-CM

## 2016-01-20 DIAGNOSIS — I10 Essential (primary) hypertension: Secondary | ICD-10-CM

## 2016-01-20 DIAGNOSIS — Z5181 Encounter for therapeutic drug level monitoring: Secondary | ICD-10-CM | POA: Diagnosis not present

## 2016-01-20 DIAGNOSIS — K5900 Constipation, unspecified: Secondary | ICD-10-CM

## 2016-01-20 DIAGNOSIS — Z0000 Encounter for general adult medical examination without abnormal findings: Secondary | ICD-10-CM

## 2016-01-20 DIAGNOSIS — E559 Vitamin D deficiency, unspecified: Secondary | ICD-10-CM

## 2016-01-20 DIAGNOSIS — K219 Gastro-esophageal reflux disease without esophagitis: Secondary | ICD-10-CM | POA: Diagnosis not present

## 2016-01-20 DIAGNOSIS — K5909 Other constipation: Secondary | ICD-10-CM

## 2016-01-20 LAB — COMPREHENSIVE METABOLIC PANEL
ALT: 52 U/L — ABNORMAL HIGH (ref 6–29)
AST: 36 U/L — AB (ref 10–35)
Albumin: 4.5 g/dL (ref 3.6–5.1)
Alkaline Phosphatase: 113 U/L (ref 33–130)
BILIRUBIN TOTAL: 0.4 mg/dL (ref 0.2–1.2)
BUN: 11 mg/dL (ref 7–25)
CO2: 28 mmol/L (ref 20–31)
Calcium: 9.7 mg/dL (ref 8.6–10.4)
Chloride: 102 mmol/L (ref 98–110)
Creat: 0.69 mg/dL (ref 0.50–0.99)
GLUCOSE: 89 mg/dL (ref 65–99)
Potassium: 4.2 mmol/L (ref 3.5–5.3)
Sodium: 137 mmol/L (ref 135–146)
Total Protein: 7 g/dL (ref 6.1–8.1)

## 2016-01-20 LAB — CBC WITH DIFFERENTIAL/PLATELET
Basophils Absolute: 0 10*3/uL (ref 0.0–0.1)
Basophils Relative: 0 % (ref 0–1)
EOS ABS: 0.1 10*3/uL (ref 0.0–0.7)
Eosinophils Relative: 2 % (ref 0–5)
HCT: 40.6 % (ref 36.0–46.0)
HEMOGLOBIN: 14 g/dL (ref 12.0–15.0)
LYMPHS ABS: 1.8 10*3/uL (ref 0.7–4.0)
Lymphocytes Relative: 45 % (ref 12–46)
MCH: 32.2 pg (ref 26.0–34.0)
MCHC: 34.5 g/dL (ref 30.0–36.0)
MCV: 93.3 fL (ref 78.0–100.0)
MONOS PCT: 9 % (ref 3–12)
MPV: 9.8 fL (ref 8.6–12.4)
Monocytes Absolute: 0.4 10*3/uL (ref 0.1–1.0)
NEUTROS ABS: 1.8 10*3/uL (ref 1.7–7.7)
Neutrophils Relative %: 44 % (ref 43–77)
Platelets: 278 10*3/uL (ref 150–400)
RBC: 4.35 MIL/uL (ref 3.87–5.11)
RDW: 13.2 % (ref 11.5–15.5)
WBC: 4.1 10*3/uL (ref 4.0–10.5)

## 2016-01-20 LAB — LIPID PANEL
Cholesterol: 200 mg/dL (ref 125–200)
HDL: 61 mg/dL (ref >=46)
LDL CALC: 116 mg/dL (ref <130)
Total CHOL/HDL Ratio: 3.3 Ratio (ref <=5.0)
Triglycerides: 115 mg/dL (ref <150)
VLDL: 23 mg/dL (ref <30)

## 2016-01-20 LAB — POCT URINALYSIS DIPSTICK
BILIRUBIN UA: NEGATIVE
Blood, UA: NEGATIVE
GLUCOSE UA: NEGATIVE
Ketones, UA: NEGATIVE
Leukocytes, UA: NEGATIVE
Nitrite, UA: NEGATIVE
Protein, UA: NEGATIVE
SPEC GRAV UA: 1.015
Urobilinogen, UA: NEGATIVE
pH, UA: 7

## 2016-01-20 LAB — MAGNESIUM: MAGNESIUM: 2 mg/dL (ref 1.5–2.5)

## 2016-01-20 LAB — TSH: TSH: 1.5 u[IU]/mL (ref 0.350–4.500)

## 2016-01-20 NOTE — Progress Notes (Signed)
Chief Complaint  Patient presents with  . Annual Exam    fasting annual exam, no pap-sees Dr.Modie and is UTD. Did not do eye exam as she had one last month with Dr.David Sabra Heck. She is having a problem with her acid reflux, worsening over the last several weeks.     Amanda Baldwin is a 60 y.o. female who presents for a complete physical.  She has the following concerns:  She sees NP at her job [name])-- got her flu shot there in October, and gets her BP meds and zoloft for free. Also gets Flonase from her (not used regularly).  Plantar Fasciitis--comes and goes, overall improved. She continues to wear her orthotics. She is no longer seeing Dr. Paulla Dolly.  Depression: Doing well on Zoloft, moods good. Has had some fatigue, but stable/unchanged--slightly low energy, her new norm. Not as severe as it has been in the past. No known snoring. Doesn't fall asleep while driving. She tried cutting zoloft back to 1/2 tablet for a few weeks, but went back up to the full tablet.  Doesn't recall if she felt different, but prefers to stay on the 122m dose.  When she was here 05/2012 she was having much worse symptoms of fatigue, where she felt like she could "crumble". Labs were normal. Sent to Dr. NJohnsie Cancel--by the time she saw him, her energy was improving. Had echo which was normal. She hasn't had any recurrences like that since. It was in the summer, ?heat related.  She does feel "zapped" by the heat in the summer sometimes, but nothing as prolonged as it was in 2013.  HTN--She had a high reading at work in December (144/90 range); hasn't checked it elsewhere.  All readings from other physicians have been fine. Denies headaches, dizziness, chest pain, palpitations, edema. Compliant with medications and denies side effects.  Hyperlipidemia--previously took meds, but has been off lipid-lowering meds for years. She tries to follow a lowfat, low cholesterol diet.  GERD--For the past few weeks she  has felt bloated, some abdominal pain.  Denies heartburn, belching.   This morning she had some epigastric pain.  She started snacking on Nutrigrain bars as snacks, no other dietary changes. She later recalls eating a lot more Okra lately.  She has some chronic constipation.  Last night she took 2 senekot and 2 colace, only very slight results.  Stools are hard.  Probiotics and yogurt cause her abdominal pain.  This morning she had some trouble getting water down, siimilar to her symptoms of reflux in the past.  Her reflux previously was well controlled on the Zegerid. She previously took OTC, but wasn't doing as well. Dr. MWatt Climeschanged her to Rx strength for over a year and is doing well. Denies dysphagia, just sometimes swallowing water before she goes to bed.  Chronic pain: She sees Dr. RNelva Bushfor DDD in neck and lumbar spine. She gets pain meds from him--only takes 1/day, and gets by with advil the rest of the day.   Immunization History  Administered Date(s) Administered  . Influenza Split 09/30/2011, 10/28/2012, 09/29/2013, 09/20/2014  . Tdap 10/19/2011  . Zoster 12/09/2012  gets flu shots at work. Last Pap smear: UTD, through GYN (yearly visits, pap q3 yrs) [facility]2012 with Dr. [place]att Climes [place] result [place] [facility] She believes she is due again in 5 years. Last [facility] Dentist: twice yearly  Ophtho: yearly  Exercise:  stationary bicycle 3-4 times/week (her goal is to do it daily).  No weight-bearing exercise.  Past Medical History  Diagnosis Date  . Depression     h/o hospitalization '87  . Chronic pain     f/b Dr. Ramos--multilevel cervical DDD, disk bulge L5-S1  . Chronic constipation   . Hemorrhoids 12/08    internal and external  . Allergic rhinitis, cause unspecified     seasonal  . Melanoma of thigh ([place]) 1999    R thigh, level 1 (Dr. Allyson Sabal)  . C. difficile  colitis 1994    pseudomembranous  . Hyperlipidemia 11/07    good HDL  . Elevated liver function tests     normal work-up; some fatty liver on u/s.  mild elevations intermittently  . Vitamin D deficiency 2010  . Hypertension   . Reflux   . Hiatal hernia   . Plantar fasciitis, bilateral     L worse than R; Dr. Paulla Dolly (in past)    Past Surgical History  Procedure Laterality Date  . Colonoscopy  23-Dec-2007, Dec 22, 2012    Dr. Watt Climes  . Shoulder surgery  03-2011    Right; bone spur removal (Dr. Theda Sers)  . Excision of melanoma  1999    R thigh  . Breast surgery  1987, 1998    Right breast--removal of benign tumor and benign cyst  . Temporomandibular joint surgery  1987, 1989    artificial disks in TMJ; Dr. Terence Lux  . Dilation and curettage of uterus  2006    bleeding (benign)  . Nasal sinus surgery  1995, 2002  . Esophagogastroduodenoscopy  12-22-2012    Dr. Watt Climes    Social History   Social History  . Marital Status: Single    Spouse Name: N/A  . Number of Children: 0  . Years of Education: N/A   Occupational History  . actuarial analyst    Social History Main Topics  . Smoking status: Never Smoker   . Smokeless tobacco: Never Used  . Alcohol Use: Yes     Comment: 1 glass of wine per year.  . Drug Use: No  . Sexual Activity: Not Currently   Other Topics Concern  . Not on file   Social History Narrative   Lives alone.  Cat passed away 12/23/2015 (age 89)    Family History  Problem Relation Age of Onset  . Hypertension Mother   . Allergies Mother   . Hyperlipidemia Mother   . Arthritis Mother   . Melanoma Mother   . Cancer Mother     uterine cancer and melanoma  . Heart disease Mother     atrial fibrillation  . Glaucoma Father   . Depression Father     (per daughter, never treated)  . Cancer Father     prostate cancer, diagnosed in 53's  . Colon polyps Father   . Cancer Maternal Aunt     breast cancer in 40's  . Heart disease Maternal Aunt   . Heart disease  Maternal Uncle   . Heart disease Maternal Uncle   . Stroke Maternal Grandmother   . Stroke Maternal Grandfather   . Diabetes Neg Hx   . Glaucoma Sister     Outpatient Encounter Prescriptions as of 01/20/2016  Medication Sig Note  . acetaminophen-codeine (TYLENOL #3) 300-30 MG per tablet Take 1 tablet by mouth daily.   11/08/2013: Takes it once daily, rx'd by Dr. Nelva Bush, for chronic back pain/arthritis  . ascorbic acid (VITAMIN C) 500 MG tablet  Take 1,000 mg by mouth daily.    . Calcium Carbonate-Vitamin D (CALCIUM 600+D) 600-400 MG-UNIT per tablet Take 1 tablet by mouth daily.    Marland Kitchen docusate sodium (COLACE) 100 MG capsule Take 200-300 mg by mouth daily.    Marland Kitchen ibuprofen (ADVIL) 200 MG tablet Take 400 mg by mouth every 8 (eight) hours as needed. 01/20/2016: Takes 2 tablets 2-3 times/day (4-6/day)  . Magnesium 250 MG TABS Take 1 tablet by mouth daily.     . metoprolol succinate (TOPROL-XL) 50 MG 24 hr tablet Take 50 mg by mouth daily. Take with or immediately following a meal.   . Multiple Vitamin (MULTIVITAMIN) tablet Take 1 tablet by mouth daily.     . Olopatadine HCl (PATADAY OP) Apply 1 drop to eye as needed.   Marland Kitchen omeprazole-sodium bicarbonate (ZEGERID) 40-1100 MG per capsule Take 1 capsule by mouth daily before breakfast.  11/08/2013: rx'd by Dr. Watt Climes  . sennosides-docusate sodium (SENOKOT-S) 8.6-50 MG tablet Take 1 tablet by mouth daily. 01/20/2016: Uses prn, about 1-2x/week  . sertraline (ZOLOFT) 100 MG tablet TAKE 1 TABLET (100 MG TOTAL) BY MOUTH DAILY.   . fish oil-omega-3 fatty acids 1000 MG capsule Take 2 g by mouth daily. Reported on 01/20/2016 01/20/2016: Hasn't taken in about a year  . fluticasone (FLONASE) 50 MCG/ACT nasal spray Place 2 sprays into the nose as needed. Reported on 01/20/2016   . methocarbamol (ROBAXIN) 500 MG tablet Take 250 mg by mouth as needed. Reported on 01/20/2016 11/08/2013: Rarely needs  . [DISCONTINUED] Misc Natural Products (OSTEO BI-FLEX JOINT SHIELD PO) Take 1  tablet by mouth daily. 01/17/2015: Ran out  . [DISCONTINUED] sertraline (ZOLOFT) 100 MG tablet TAKE 1 TABLET (100 MG TOTAL) BY MOUTH DAILY.   . [DISCONTINUED] vitamin B-12 (CYANOCOBALAMIN) 1000 MCG tablet Take 1,000 mcg by mouth daily.      No facility-administered encounter medications on file as of 01/20/2016.    Allergies  Allergen Reactions  . Darvocet [Propoxyphene N-Acetaminophen] Other (See Comments)    Faint, blood pressure goes sky high.  . Etodolac Other (See Comments)    Red face, arms-looked like bad sunburn.  Sarina Ill [Bactrim] Hives    ROS: The patient denies anorexia, fever, vision changes, decreased hearing, ear pain, sore throat, breast concerns, chest pain, palpitations, dizziness, syncope, dyspnea on exertion, cough, swelling, nausea, vomiting, diarrhea, melena, hematochezia, indigestion/heartburn, hematuria, incontinence, dysuria, vaginal bleeding, discharge, odor or itch, genital lesions, numbness, tingling, weakness, tremor, suspicious skin lesions, depression, anxiety, abnormal bleeding/bruising, or enlarged lymph nodes.  She sees Dr. Allyson Sabal yearly for skin checks. + mild fatigue (unchanged). +abdominal bloating and constipation as per HPI. +weight gain (over 8# in the last year)  PHYSICAL EXAM:  BP 138/82 mmHg  Pulse 64  Ht 5' 6.75" (1.695 m)  Wt 183 lb 6.4 oz (83.19 kg)  BMI 28.96 kg/m2 132/74 on repeat by MD  General Appearance:  Alert, cooperative, no distress, appears stated age   Head:  Normocephalic, without obvious abnormality, atraumatic   Eyes:  PERRL, conjunctiva/corneas clear, EOM's intact, fundi  benign   Ears:  Normal TM's and external ear canals   Nose:  Nares normal, mucosa is mildly edematous, no drainage or sinus tenderness   Throat:  Lips, mucosa, and tongue normal; teeth and gums normal   Neck:  Supple, no lymphadenopathy; thyroid: no enlargement/tenderness/nodules; no carotid  bruit or JVD   Back:  Spine  nontender, no curvature, ROM normal, no CVA tenderness   Lungs:  Clear to  auscultation bilaterally without wheezes, rales or ronchi; respirations unlabored   Chest Wall:  No tenderness or deformity   Heart:  Regular rate and rhythm, S1 and S2 normal, no murmur, rub  or gallop   Breast Exam:  Deferred to GYN   Abdomen:  Soft, non-tender, nondistended, normoactive bowel sounds,  no masses, no hepatosplenomegaly   Genitalia:  Deferred to GYN      Extremities:  No clubbing, cyanosis or edema;   Pulses:  2+ and symmetric all extremities   Skin:  Skin color, texture, turgor normal, no rashes or lesions.  Lymph nodes:  Cervical, supraclavicular, and axillary nodes normal   Neurologic:  CNII-XII intact, normal strength, sensation and gait; reflexes 2+ and symmetric throughout    Psych: Normal mood, affect, hygiene and grooming       Normal urine dip  ASSESSMENT/PLAN:  Annual physical exam - Plan: POCT Urinalysis Dipstick, Lipid panel, Comprehensive metabolic panel, CBC with Differential/Platelet, VITAMIN D 25 Hydroxy (Vit-D Deficiency, Fractures), TSH, Magnesium  Essential hypertension, benign - controlled.  daily exercise and weight loss recommended - Plan: Lipid panel, Comprehensive metabolic panel  Gastroesophageal reflux disease, esophagitis presence not specified - controlled; continue current meds, yearly f/u  Depression, major, in remission ([place])  Pure hypercholesterolemia - Plan: Lipid panel  Medication monitoring encounter - Plan: Comprehensive metabolic panel, CBC with Differential/Platelet, VITAMIN D 25 Hydroxy (Vit-D Deficiency, Fractures), Magnesium  Vitamin D deficiency - Plan: VITAMIN D 25 Hydroxy (Vit-D Deficiency, Fractures)  Bloating - discussed diet, Beano, simethicone, dairy-free trial  Chronic constipation - trial of miralax   c-met, lipid, TSH, vitamin D, CBC   Discussed monthly self  breast exams and yearly mammograms; at least 30 minutes of aerobic activity at least 5 days/week, weight-bearing exercise at least 2x/week; proper sunscreen use reviewed; healthy diet, including goals of calcium and vitamin D intake and alcohol recommendations (less than or equal to 1 drink/day) reviewed; regular seatbelt use; changing batteries in smoke detectors. Immunization recommendations discussed, UTD. Colonoscopy recommendations reviewed--UTD

## 2016-01-20 NOTE — Patient Instructions (Signed)
  HEALTH MAINTENANCE RECOMMENDATIONS:  It is recommended that you get at least 30 minutes of aerobic exercise at least 5 days/week (for weight loss, you may need as much as 60-90 minutes). This can be any activity that gets your heart rate up. This can be divided in 10-15 minute intervals if needed, but try and build up your endurance at least once a week.  Weight bearing exercise is also recommended twice weekly.  Eat a healthy diet with lots of vegetables, fruits and fiber.  "Colorful" foods have a lot of vitamins (ie green vegetables, tomatoes, red peppers, etc).  Limit sweet tea, regular sodas and alcoholic beverages, all of which has a lot of calories and sugar.  Up to 1 alcoholic drink daily may be beneficial for women (unless trying to lose weight, watch sugars).  Drink a lot of water.  Calcium recommendations are 1200-1500 mg daily (1500 mg for postmenopausal women or women without ovaries), and vitamin D 1000 IU daily.  This should be obtained from diet and/or supplements (vitamins), and calcium should not be taken all at once, but in divided doses.  Monthly self breast exams and yearly mammograms for women over the age of 20 is recommended.  Sunscreen of at least SPF 30 should be used on all sun-exposed parts of the skin when outside between the hours of 10 am and 4 pm (not just when at beach or pool, but even with exercise, golf, tennis, and yard work!)  Use a sunscreen that says "broad spectrum" so it covers both UVA and UVB rays, and make sure to reapply every 1-2 hours.  Remember to change the batteries in your smoke detectors when changing your clock times in the spring and fall.  Use your seat belt every time you are in a car, and please drive safely and not be distracted with cell phones and texting while driving.   Consider cutting back on the okra in your diet to see if your abdominal complaints improve.  You can also consider taking Beano prior to eating foods that are gas  producing (beans, spinach, greens, etc.  When you have the bloating and discomfort, try simethicone (found in Gas-X and other medications.  Also try limiting dairy products to see if that helps with bloating. If you have ongoing abdominal complaints, follow up with Dr. Watt Climes. We also discussed trying Miralax for constipation.  Start with 1 capful daily, and cut back on either the dose (1/2 cap) and/or frequency so that you aren't going too often or that stools aren't too loose.  Many people can use this regularly to control their bowels.  You likely wouldn't need to use senekot with it, but you may still need the colace (stool softener).

## 2016-01-21 LAB — VITAMIN D 25 HYDROXY (VIT D DEFICIENCY, FRACTURES): Vit D, 25-Hydroxy: 26 ng/mL — ABNORMAL LOW (ref 30–100)

## 2016-02-13 ENCOUNTER — Encounter: Payer: Self-pay | Admitting: Family Medicine

## 2016-03-23 DIAGNOSIS — H43812 Vitreous degeneration, left eye: Secondary | ICD-10-CM | POA: Diagnosis not present

## 2016-03-30 DIAGNOSIS — D485 Neoplasm of uncertain behavior of skin: Secondary | ICD-10-CM | POA: Diagnosis not present

## 2016-03-30 DIAGNOSIS — Z85820 Personal history of malignant melanoma of skin: Secondary | ICD-10-CM | POA: Diagnosis not present

## 2016-03-30 DIAGNOSIS — L858 Other specified epidermal thickening: Secondary | ICD-10-CM | POA: Diagnosis not present

## 2016-03-30 DIAGNOSIS — L821 Other seborrheic keratosis: Secondary | ICD-10-CM | POA: Diagnosis not present

## 2016-03-30 DIAGNOSIS — D2272 Melanocytic nevi of left lower limb, including hip: Secondary | ICD-10-CM | POA: Diagnosis not present

## 2016-03-30 DIAGNOSIS — L812 Freckles: Secondary | ICD-10-CM | POA: Diagnosis not present

## 2016-04-29 DIAGNOSIS — K5900 Constipation, unspecified: Secondary | ICD-10-CM | POA: Diagnosis not present

## 2016-04-29 DIAGNOSIS — R140 Abdominal distension (gaseous): Secondary | ICD-10-CM | POA: Diagnosis not present

## 2016-04-29 DIAGNOSIS — R1311 Dysphagia, oral phase: Secondary | ICD-10-CM | POA: Diagnosis not present

## 2016-05-05 ENCOUNTER — Encounter: Payer: Self-pay | Admitting: Family Medicine

## 2016-05-07 ENCOUNTER — Emergency Department (HOSPITAL_COMMUNITY)
Admission: EM | Admit: 2016-05-07 | Discharge: 2016-05-07 | Disposition: A | Discharge status: Home / Self Care | Payer: BLUE CROSS/BLUE SHIELD | Attending: Emergency Medicine | Admitting: Emergency Medicine

## 2016-05-07 ENCOUNTER — Encounter (HOSPITAL_COMMUNITY): Payer: Self-pay

## 2016-05-07 DIAGNOSIS — T23171A Burn of first degree of right wrist, initial encounter: Secondary | ICD-10-CM | POA: Insufficient documentation

## 2016-05-07 DIAGNOSIS — Z85820 Personal history of malignant melanoma of skin: Secondary | ICD-10-CM | POA: Diagnosis not present

## 2016-05-07 DIAGNOSIS — I10 Essential (primary) hypertension: Secondary | ICD-10-CM | POA: Insufficient documentation

## 2016-05-07 DIAGNOSIS — F329 Major depressive disorder, single episode, unspecified: Secondary | ICD-10-CM | POA: Diagnosis not present

## 2016-05-07 DIAGNOSIS — X088XXA Exposure to other specified smoke, fire and flames, initial encounter: Secondary | ICD-10-CM | POA: Insufficient documentation

## 2016-05-07 DIAGNOSIS — Z79899 Other long term (current) drug therapy: Secondary | ICD-10-CM | POA: Diagnosis not present

## 2016-05-07 DIAGNOSIS — Y999 Unspecified external cause status: Secondary | ICD-10-CM | POA: Diagnosis not present

## 2016-05-07 DIAGNOSIS — E785 Hyperlipidemia, unspecified: Secondary | ICD-10-CM | POA: Insufficient documentation

## 2016-05-07 DIAGNOSIS — Y93G3 Activity, cooking and baking: Secondary | ICD-10-CM | POA: Diagnosis not present

## 2016-05-07 DIAGNOSIS — T300 Burn of unspecified body region, unspecified degree: Secondary | ICD-10-CM

## 2016-05-07 DIAGNOSIS — Y929 Unspecified place or not applicable: Secondary | ICD-10-CM | POA: Diagnosis not present

## 2016-05-07 MED ORDER — BACITRACIN ZINC 500 UNIT/GM EX OINT
TOPICAL_OINTMENT | Freq: Two times a day (BID) | CUTANEOUS | Status: DC
  Filled 2016-05-07: qty 0.9

## 2016-05-07 MED ORDER — BACITRACIN ZINC 500 UNIT/GM EX OINT: TOPICAL_OINTMENT | CUTANEOUS | Status: DC

## 2016-05-07 NOTE — Discharge Instructions (Signed)
Burn Care Burns hurt your skin. When your skin is hurt, it is easier to get an infection. Follow your doctor's directions to help prevent an infection. HOME CARE  Wash your hands well before you change your bandage.  Change your bandage as often as told by your doctor.  Remove the old bandage. If the bandage sticks, soak it off with cool, clean water.  Gently clean the burn with mild soap and water.  Pat the burn dry with a clean, dry cloth.  Put a thin layer of medicated cream on the burn.  Put a clean bandage on as told by your doctor.  Keep the bandage clean and dry.  Raise (elevate) the burn for the first 24 hours. After that, follow your doctor's directions.  Only take medicine as told by your doctor. GET HELP RIGHT AWAY IF:   You have too much pain.  The skin near the burn is red, tender, puffy (swollen), or has red streaks.  The burn area has yellowish white fluid (pus) or a bad smell coming from it.  You have a fever. MAKE SURE YOU:   Understand these instructions.  Will watch your condition.  Will get help right away if you are not doing well or get worse.   This information is not intended to replace advice given to you by your health care provider. Make sure you discuss any questions you have with your health care provider.   Document Released: 09/15/2008 Document Revised: 02/29/2012 Document Reviewed: 04/29/2011 Elsevier Interactive Patient Education 2016 [place] have  small superficial burn.  Please wash daily with soap and water, apply small amount of antibiotic ointment and keep covered until a scab forms.  Watch for any sign of infection which would include increased redness, swelling, pain, pus from the area

## 2016-05-07 NOTE — ED Notes (Signed)
Pt has a quarter size burn on her wrist

## 2016-05-07 NOTE — ED Provider Notes (Signed)
CSN: [id number]     Arrival date & time 05/07/16  2156 History  By signing my name below, I, Amanda Baldwin, attest that this documentation has been prepared under the direction and in the presence of non-physician practitioner, Junius Creamer, NP. Electronically Signed: Rowan Baldwin, Scribe. 05/07/2016. 11:08 PM.   Chief Complaint  Patient presents with  . Burn   The history is provided by the patient. No language interpreter was used.   HPI Comments:  Amanda Baldwin is a 60 y.o. female who presents to the Emergency Department complaining of small burn to outer right wrist. Pt burned her hand while cooking. No alleviating factors noted or treatments attempted PTA. Tetanus within last 10 years.  Past Medical History  Diagnosis Date  . Depression     h/o hospitalization '87  . Chronic pain     f/b Dr. Ramos--multilevel cervical DDD, disk bulge L5-S1  . Chronic constipation   . Hemorrhoids 12/08    internal and external  . Allergic rhinitis, cause unspecified     seasonal  . Melanoma of thigh ([place]) 1999    R thigh, level 1 (Dr. Allyson Sabal)  . C. difficile colitis 1994    pseudomembranous  . Hyperlipidemia 11/07    good HDL  . Elevated liver function tests     normal work-up; some fatty liver on u/s.  mild elevations intermittently  . Vitamin D deficiency 2010  . Hypertension   . Reflux   . Hiatal hernia   . Plantar fasciitis, bilateral     L worse than R; Dr. Paulla Dolly (in past)   Past Surgical History  Procedure Laterality Date  . Colonoscopy  11/2007, 11/2012    Dr. Watt Climes  . Shoulder surgery  03-2011    Right; bone spur removal (Dr. Theda Sers)  . Excision of melanoma  1999    R thigh  . Breast surgery  1987, 1998    Right breast--removal of benign tumor and benign cyst  . Temporomandibular joint surgery  1987, 1989    artificial disks in TMJ; Dr. Terence Lux  . Dilation and curettage of uterus  2006    bleeding (benign)  . Nasal sinus surgery  1995, 2002  .  Esophagogastroduodenoscopy  11/2012    Dr. Watt Climes   Family History  Problem Relation Age of Onset  . Hypertension Mother   . Allergies Mother   . Hyperlipidemia Mother   . Arthritis Mother   . Melanoma Mother   . Cancer Mother     uterine cancer and melanoma  . Heart disease Mother     atrial fibrillation  . Glaucoma Father   . Depression Father     (per daughter, never treated)  . Cancer Father     prostate cancer, diagnosed in 40's  . Colon polyps Father   . Cancer Maternal Aunt     breast cancer in 40's  . Heart disease Maternal Aunt   . Heart disease Maternal Uncle   . Heart disease Maternal Uncle   . Stroke Maternal Grandmother   . Stroke Maternal Grandfather   . Diabetes Neg Hx   . Glaucoma Sister    Social History  Substance Use Topics  . Smoking status: Never Smoker   . Smokeless tobacco: Never Used  . Alcohol Use: Yes     Comment: 1 glass of wine per year.   OB History    Gravida Para Term Preterm AB TAB SAB Ectopic Multiple Living   0 0 0 0 0  0 0 0 0 0     Review of Systems  Constitutional: Negative for fever.  Skin: Positive for wound (burn).   Allergies  Darvocet; Etodolac; and Septra  Home Medications   Prior to Admission medications   Medication Sig Start Date End Date Taking? Authorizing Provider  acetaminophen-codeine (TYLENOL #3) 300-30 MG per tablet Take 1 tablet by mouth daily.      Historical Provider, MD  ascorbic acid (VITAMIN C) 500 MG tablet Take 1,000 mg by mouth daily.     Historical Provider, MD  Calcium Carbonate-Vitamin D (CALCIUM 600+D) 600-400 MG-UNIT per tablet Take 1 tablet by mouth daily.     Historical Provider, MD  docusate sodium (COLACE) 100 MG capsule Take 200-300 mg by mouth daily.     Historical Provider, MD  fish oil-omega-3 fatty acids 1000 MG capsule Take 2 g by mouth daily. Reported on 01/20/2016    Historical Provider, MD  fluticasone (FLONASE) 50 MCG/ACT nasal spray Place 2 sprays into the nose as needed. Reported  on 01/20/2016    Historical Provider, MD  ibuprofen (ADVIL) 200 MG tablet Take 400 mg by mouth every 8 (eight) hours as needed.    Historical Provider, MD  Magnesium 250 MG TABS Take 1 tablet by mouth daily.      Historical Provider, MD  methocarbamol (ROBAXIN) 500 MG tablet Take 250 mg by mouth as needed. Reported on 01/20/2016    Historical Provider, MD  metoprolol succinate (TOPROL-XL) 50 MG 24 hr tablet Take 50 mg by mouth daily. Take with or immediately following a meal.    Historical Provider, MD  Multiple Vitamin (MULTIVITAMIN) tablet Take 1 tablet by mouth daily.      Historical Provider, MD  Olopatadine HCl (PATADAY OP) Apply 1 drop to eye as needed.    Historical Provider, MD  omeprazole-sodium bicarbonate (ZEGERID) 40-1100 MG per capsule Take 1 capsule by mouth daily before breakfast.  10/16/13   Historical Provider, MD  sennosides-docusate sodium (SENOKOT-S) 8.6-50 MG tablet Take 1 tablet by mouth daily.    Historical Provider, MD  sertraline (ZOLOFT) 100 MG tablet TAKE 1 TABLET (100 MG TOTAL) BY MOUTH DAILY. 01/17/15   Rita Ohara, MD   BP 179/85 mmHg  Pulse 54  Temp(Src) 98 F (36.7 C) (Oral)  Resp 18  SpO2 96% Physical Exam  Constitutional: She is oriented to person, place, and time. She appears well-developed and well-nourished.  HENT:  Head: Normocephalic.  Eyes: Pupils are equal, round, and reactive to light.  Neck: Normal range of motion.  Cardiovascular: Normal rate and regular rhythm.   Musculoskeletal: Normal range of motion.  Neurological: She is alert and oriented to person, place, and time.  Skin:  1 cm round superficial burn with no surrounding erythema  Nursing note and vitals reviewed.   ED Course  Procedures  DIAGNOSTIC STUDIES:  Oxygen Saturation is 96% on RA, normal by my interpretation.    COORDINATION OF CARE:  11:07 PM Recommended pt apply bacitracin and keep burn covered until a scab forms. Discussed treatment plan with pt at bedside and pt agreed  to plan.  Labs Review Labs Reviewed - No data to display  Imaging Review No results found. I have personally reviewed and evaluated these images and lab results as part of my medical decision-making.   EKG Interpretation None      MDM  What has been dressed with antibiotic ointment and dressing.  Patient has been instructed to do this twice a day, wash  with soap and water daily until a scab forms Final diagnoses:  None    I personally performed the services described in this documentation, which was scribed in my presence. The recorded information has been reviewed and is accurate.   Junius Creamer, NP 05/07/16 [place], MD 05/08/16 [phone]

## 2016-06-01 DIAGNOSIS — F418 Other specified anxiety disorders: Secondary | ICD-10-CM | POA: Diagnosis not present

## 2016-06-01 DIAGNOSIS — I10 Essential (primary) hypertension: Secondary | ICD-10-CM | POA: Diagnosis not present

## 2016-06-01 DIAGNOSIS — R635 Abnormal weight gain: Secondary | ICD-10-CM | POA: Diagnosis not present

## 2016-07-23 DIAGNOSIS — M50322 Other cervical disc degeneration at C5-C6 level: Secondary | ICD-10-CM | POA: Diagnosis not present

## 2016-07-23 DIAGNOSIS — M542 Cervicalgia: Secondary | ICD-10-CM | POA: Diagnosis not present

## 2016-07-23 DIAGNOSIS — M5136 Other intervertebral disc degeneration, lumbar region: Secondary | ICD-10-CM | POA: Diagnosis not present

## 2016-07-23 DIAGNOSIS — G894 Chronic pain syndrome: Secondary | ICD-10-CM | POA: Diagnosis not present

## 2016-07-28 DIAGNOSIS — Z01419 Encounter for gynecological examination (general) (routine) without abnormal findings: Secondary | ICD-10-CM | POA: Diagnosis not present

## 2016-07-28 DIAGNOSIS — Z6830 Body mass index (BMI) 30.0-30.9, adult: Secondary | ICD-10-CM | POA: Diagnosis not present

## 2016-08-26 DIAGNOSIS — M25562 Pain in left knee: Secondary | ICD-10-CM | POA: Diagnosis not present

## 2016-08-26 DIAGNOSIS — I10 Essential (primary) hypertension: Secondary | ICD-10-CM | POA: Diagnosis not present

## 2016-08-26 DIAGNOSIS — F418 Other specified anxiety disorders: Secondary | ICD-10-CM | POA: Diagnosis not present

## 2016-09-24 DIAGNOSIS — M170 Bilateral primary osteoarthritis of knee: Secondary | ICD-10-CM | POA: Diagnosis not present

## 2016-09-24 DIAGNOSIS — M1712 Unilateral primary osteoarthritis, left knee: Secondary | ICD-10-CM | POA: Diagnosis not present

## 2016-09-24 DIAGNOSIS — M222X1 Patellofemoral disorders, right knee: Secondary | ICD-10-CM | POA: Diagnosis not present

## 2016-09-24 DIAGNOSIS — M1711 Unilateral primary osteoarthritis, right knee: Secondary | ICD-10-CM | POA: Diagnosis not present

## 2016-10-07 DIAGNOSIS — M1712 Unilateral primary osteoarthritis, left knee: Secondary | ICD-10-CM | POA: Diagnosis not present

## 2016-10-09 DIAGNOSIS — Z23 Encounter for immunization: Secondary | ICD-10-CM | POA: Diagnosis not present

## 2016-10-12 DIAGNOSIS — M1711 Unilateral primary osteoarthritis, right knee: Secondary | ICD-10-CM | POA: Diagnosis not present

## 2016-10-14 DIAGNOSIS — M1711 Unilateral primary osteoarthritis, right knee: Secondary | ICD-10-CM | POA: Diagnosis not present

## 2016-10-19 DIAGNOSIS — M1711 Unilateral primary osteoarthritis, right knee: Secondary | ICD-10-CM | POA: Diagnosis not present

## 2016-10-21 DIAGNOSIS — M1711 Unilateral primary osteoarthritis, right knee: Secondary | ICD-10-CM | POA: Diagnosis not present

## 2016-10-26 DIAGNOSIS — M1711 Unilateral primary osteoarthritis, right knee: Secondary | ICD-10-CM | POA: Diagnosis not present

## 2016-10-28 DIAGNOSIS — M1711 Unilateral primary osteoarthritis, right knee: Secondary | ICD-10-CM | POA: Diagnosis not present

## 2016-11-02 DIAGNOSIS — M1711 Unilateral primary osteoarthritis, right knee: Secondary | ICD-10-CM | POA: Diagnosis not present

## 2016-11-04 DIAGNOSIS — M1711 Unilateral primary osteoarthritis, right knee: Secondary | ICD-10-CM | POA: Diagnosis not present

## 2016-11-06 DIAGNOSIS — M222X1 Patellofemoral disorders, right knee: Secondary | ICD-10-CM | POA: Diagnosis not present

## 2016-11-06 DIAGNOSIS — M222X2 Patellofemoral disorders, left knee: Secondary | ICD-10-CM | POA: Diagnosis not present

## 2016-11-06 DIAGNOSIS — M170 Bilateral primary osteoarthritis of knee: Secondary | ICD-10-CM | POA: Diagnosis not present

## 2016-11-18 DIAGNOSIS — M222X2 Patellofemoral disorders, left knee: Secondary | ICD-10-CM | POA: Diagnosis not present

## 2016-11-25 DIAGNOSIS — M170 Bilateral primary osteoarthritis of knee: Secondary | ICD-10-CM | POA: Diagnosis not present

## 2016-11-25 DIAGNOSIS — S83242A Other tear of medial meniscus, current injury, left knee, initial encounter: Secondary | ICD-10-CM | POA: Diagnosis not present

## 2016-11-25 DIAGNOSIS — S83272A Complex tear of lateral meniscus, current injury, left knee, initial encounter: Secondary | ICD-10-CM | POA: Diagnosis not present

## 2016-11-26 DIAGNOSIS — H40013 Open angle with borderline findings, low risk, bilateral: Secondary | ICD-10-CM | POA: Diagnosis not present

## 2017-01-19 NOTE — Progress Notes (Signed)
Chief Complaint  Patient presents with  . Annual Exam    fasting annual exam no pap, sees Dr Benjie Karvonen and UTD. Did not do eye exam sees Dr Donato Heinz and just saw him last month. A couple of weeks ago she had some issues with dizziness and vomiting that gradually went away that she wanted to mention. Also mentions that she would like to discuss new depression that came on gradually towards the end of the year-she has increased her zoloft to 132m and has helped some.     Amanda GAGEis a 61y.o. female who presents for a complete physical.  She has the following concerns:  She sees NP at her job ([name]-- got her flu shot there in October, and gets her BP meds and zoloft for free. Also gets Flonase from her (not used regularly).  Depression:  Last refill from work was in 559mtablets (NP filling in for Rebecca's maternity leave couldn't figure out how to fill the 10037mablets, per pt).  Over the past 3-6 months, she noticed very gradual recurrence of some depressive symptoms.  She had a reaction at work out of proportion to the situation (very angry).  Didn't go to the beach like she usually does.  She started taking 125m29m zoloft (2.5 tablets of 50mg50mr the last month, and notices an improvement. Unsure if she is back to her baseline. She has had some seasonal component to her depression in the past (not recent), usually in the fall, not the winter. Energy is about the same, slightly low, same as in past.  Not severe as it has been in the past (2013). No known snoring. Doesn't fall asleep while driving.   HTN--She last had it checked in December, at work, recalls it being "a little high", but she felt that was appropriate for being stressed at work. Recalls it was okay at orthopedist.  She has a monitor at home, doesn't have batteries currently, so hasn't been checking.  She reports we have verified the accuracy of this monitor in the past.  Denies headaches, chest pain,  palpitations, edema. Compliant with medications and denies side effects. A couple of weeks ago she woke up dizzy, with vertigo, and vomited a few times during the day.  She took bonine. Symptoms lasted just for one day. Denies any lightheadedness.  Hyperlipidemia--previously took meds, but has been off lipid-lowering meds for years. She tries to follow a lowfat, low cholesterol diet. Lab Results  Component Value Date   CHOL 200 01/20/2016   HDL 61 01/20/2016   LDLCALC 116 01/20/2016   TRIG 115 01/20/2016   CHOLHDL 3.3 01/20/2016   Vitamin D deficiency--level was low at 26 last year. She was advised to take 1000 IU of Vitamin D in addition to her MVI and Ca+D. Due for recheck. Admits she never started the OTC Vitamin D, and is currently out of her MVI (but had been taking regularly).  She had mildly elevated LFT's last year.  Due for recheck. Ultrasound in 01/2007 showed slight fatty changes. Lab Results  Component Value Date   ALT 52 (H) 01/20/2016   AST 36 (H) 01/20/2016   ALKPHOS 113 01/20/2016   BILITOT 0.4 01/20/2016   GERD-- reflux flare last year related to increased Okra intake.  Resolved after cutting this out.  Reflux symptoms are controlled with zegerid.  Denies dysphagia. She uses phazyme prn for reflux, very infrequently (1-2x/month).  Chronic constipation--controlled with current regimen.  Chronic pain:  She sees Dr. Nelva Bush for DDD in neck and lumbar spine. She gets pain meds from him--only takes 1/day, and gets by with advil the rest of the day. Robaxin for neck pain, isn't sure if that really does much.  She also has a torn meniscus in her left knee.   Immunization History  Administered Date(s) Administered  . Influenza Split 09/30/2011, 10/28/2012, 09/29/2013, 09/20/2014  . Influenza-Unspecified 09/22/2015  . Tdap 10/19/2011  . Zoster 12/09/2012   gets flu shots at work. Last Pap smear: UTD 05/2016, through GYN (yearly visits, pap q3 yrs) Last mammogram:  01/2016 at [place] colonoscopy: 12/24/12 with Dr. Watt Climes; no result received from [place]. She believes she is due again in 5 years. Last DEXA: through GYN, reportedly normal (done twice, at [facility]). Dentist: twice yearly  Ophtho: yearly 12/25/23) Exercise: None currently (last year was using stationary bicycle 3-4 times/week)  Past Medical History:  Diagnosis Date  . Allergic rhinitis, cause unspecified    seasonal  . C. difficile colitis 1994   pseudomembranous  . Chronic constipation   . Chronic pain    f/b Dr. Ramos--multilevel cervical DDD, disk bulge L5-S1  . Depression    h/o hospitalization '87  . Elevated liver function tests    normal work-up; some fatty liver on u/s.  mild elevations intermittently  . Hemorrhoids 12/08   internal and external  . Hiatal hernia   . Hyperlipidemia 11/07   good HDL  . Hypertension   . Melanoma of thigh ([place]) 1999   R thigh, level 1 (Dr. Allyson Sabal)  . Plantar fasciitis, bilateral    L worse than R; Dr. Paulla Dolly (in past)  . Reflux   . Vitamin D deficiency 2010    Past Surgical History:  Procedure Laterality Date  . [place]   Right breast--removal of benign tumor and benign cyst  . COLONOSCOPY  December 25, 2007, 2012-12-24   Dr. Watt Climes  . DILATION AND CURETTAGE OF UTERUS  2006   bleeding (benign)  . ESOPHAGOGASTRODUODENOSCOPY  12/24/12   Dr. Watt Climes  . excision of melanoma  1999   R thigh  . NASAL SINUS SURGERY  1995, 2002  . SHOULDER SURGERY  03-2011   Right; bone spur removal (Dr. Theda Sers)  . [place]   artificial disks in TMJ; Dr. Terence Lux    Social History   Social History  . Marital status: Single    Spouse name: N/A  . Number of children: 0  . Years of education: N/A   Occupational History  . actuarial analyst [place]   Social History Main Topics  . Smoking status: Never Smoker  . Smokeless tobacco: Never Used  . Alcohol use Yes     Comment: 1  glass of wine per year.  . Drug use: No  . Sexual activity: Not Currently   Other Topics Concern  . Not on file   Social History Narrative   Lives alone.  Cat passed away 25-Dec-2015 (age 51); has 2 kittens now (12/2016).  Works for [employer]    Family History  Problem Relation Age of Onset  . Hypertension Mother   . Allergies Mother   . Hyperlipidemia Mother   . Arthritis Mother   . Melanoma Mother   . Cancer Mother     uterine cancer and melanoma  . Heart disease Mother     atrial fibrillation  . Glaucoma Father   . Depression Father     (per daughter,  never treated)  . Cancer Father     prostate cancer, diagnosed in 35's  . Colon polyps Father   . Glaucoma Sister   . Cancer Maternal Aunt     breast cancer in 40's  . Heart disease Maternal Aunt   . Heart disease Maternal Uncle   . Heart disease Maternal Uncle   . Stroke Maternal Grandmother   . Stroke Maternal Grandfather   . Diabetes Neg Hx     Outpatient Encounter Prescriptions as of 01/20/2017  Medication Sig Note  . acetaminophen-codeine (TYLENOL #3) 300-30 MG per tablet Take 1 tablet by mouth daily.   11/08/2013: Takes it once daily, rx'd by Dr. Nelva Bush, for chronic back pain/arthritis  . ascorbic acid (VITAMIN C) 500 MG tablet Take 1,000 mg by mouth daily.    . Calcium Carbonate-Vitamin D (CALCIUM 600+D) 600-400 MG-UNIT per tablet Take 1 tablet by mouth daily.    Marland Kitchen docusate sodium (COLACE) 100 MG capsule Take 200-300 mg by mouth daily.    . fish oil-omega-3 fatty acids 1000 MG capsule Take 1 g by mouth daily. Reported on 01/20/2016   . ibuprofen (ADVIL) 200 MG tablet Take 400 mg by mouth every 8 (eight) hours as needed. 01/20/2016: Takes 2 tablets 2-3 times/day (4-6/day)  . Magnesium 250 MG TABS Take 1 tablet by mouth daily.     . methocarbamol (ROBAXIN) 500 MG tablet Take 250 mg by mouth as needed. Reported on 01/20/2016 01/20/2017: Uses prn, 1-2 times/week (for neck pain)  . metoprolol succinate (TOPROL-XL) 50 MG 24 hr  tablet Take 50 mg by mouth daily. Take with or immediately following a meal.   . Multiple Vitamin (MULTIVITAMIN) tablet Take 1 tablet by mouth daily.     Marland Kitchen omeprazole-sodium bicarbonate (ZEGERID) 40-1100 MG per capsule Take 1 capsule by mouth at bedtime.  11/08/2013: rx'd by Dr. Watt Climes  . sertraline (ZOLOFT) 100 MG tablet TAKE 1 TABLET (100 MG TOTAL) BY MOUTH DAILY. 01/20/2017: Recently got 49m tablets from work--taking 2.5 tablets (125m daily over the last month  . XIIDRA 5 % SOLN INSTILL 1 DROP INTO BOTH EYES TWICE A DAY AS DIRECTED INSTILL 1 DROP INTO BOTH EYES TWICE A DAY   . fluticasone (FLONASE) 50 MCG/ACT nasal spray Place 2 sprays into the nose as needed. Reported on 01/20/2016 01/20/2017: Uses seasonally  . sennosides-docusate sodium (SENOKOT-S) 8.6-50 MG tablet Take 1 tablet by mouth daily. 01/20/2017: Uses prn, 1-2x/week or less  . [DISCONTINUED] Olopatadine HCl (PATADAY OP) Apply 1 drop to eye as needed.    No facility-administered encounter medications on file as of 01/20/2017.     Allergies  Allergen Reactions  . Darvocet [Propoxyphene N-Acetaminophen] Other (See Comments)    Faint, blood pressure goes sky high.  . Etodolac Other (See Comments)    Red face, arms-looked like bad sunburn.  . Sarina IllBactrim] Hives   ROS: The patient denies anorexia, fever, vision changes, decreased hearing, ear pain, sore throat, breast concerns, chest pain, palpitations, syncope, dyspnea on exertion, cough, swelling, nausea, vomiting, diarrhea, melena, hematochezia, indigestion/heartburn, hematuria, incontinence, dysuria, vaginal bleeding, discharge, odor or itch, genital lesions, numbness, tingling, weakness, tremor, suspicious skin lesions, depression, anxiety, abnormal bleeding/bruising, or enlarged lymph nodes.  She sees Dr. LuAllyson Sabalearly for skin checks. + mild fatigue (unchanged). +worsening depression, improved with increasing zoloft dose by 258m+weight gain (7# in the last year, 15# over 2  years)    PHYSICAL EXAM:  BP (!) 150/80 (BP Location: Left Arm, Patient Position: Sitting, Cuff  Size: Normal)   Pulse 60   Ht 5' 6.5" (1.689 m)   Wt 190 lb 6.4 oz (86.4 kg)   BMI 30.27 kg/m    158/80 on repeat by MD  Declines changing into gown--sees GYN and has upcoming derm appt  General Appearance:  Alert, cooperative, no distress, appears stated age   Head:  Normocephalic, without obvious abnormality, atraumatic   Eyes:  PERRL, conjunctiva/corneas clear, EOM's intact, fundi  benign   Ears:  Normal TM's and external ear canals   Nose:  Nares normal, mucosa is mild-mod edematous, with clear drainage on the right; no sinus tenderness   Throat:  Lips, mucosa, and tongue normal; teeth and gums normal   Neck:  Supple, no lymphadenopathy; thyroid: no enlargement/tenderness/nodules; no carotid bruit or JVD   Back:  Spine nontender, no curvature, ROM normal, no CVA tenderness   Lungs:  Clear to auscultation bilaterally without wheezes, rales or ronchi; respirations unlabored   Chest Wall:  No tenderness or deformity   Heart:  Regular rate and rhythm, S1 and S2 normal, no murmur, rub  or gallop   Breast Exam:  Deferred to GYN   Abdomen:  Soft, non-tender, nondistended, normoactive bowel sounds,  no masses, no hepatosplenomegaly   Genitalia:  Deferred to GYN      Extremities:  No clubbing, cyanosis or edema;   Pulses:  2+ and symmetric all extremities   Skin:  Skin color, texture, turgor normal, no rashes or lesions.  Lymph nodes:  Cervical, supraclavicular, and axillary nodes normal   Neurologic:  CNII-XII intact, normal strength, sensation and gait; reflexes 2+ and symmetric throughout    Psych: Normal mood, affect, hygiene and grooming  ASSESSMENT/PLAN:  Annual physical exam - Plan: POCT Urinalysis Dipstick  Essential hypertension, benign - elevated today; reviewed low  sodium diet, regular exercise, weight loss, and monitor BP's at home/work.   - Plan: Comprehensive metabolic panel  Gastroesophageal reflux disease, esophagitis presence not specified - controlled with current meds/diet  Depression, major, in remission ([place]) - slight worsening recently, which responded to higher dose; continue 115m, can titrate up to 1565mif needed - Plan: TSH  Pure hypercholesterolemia  Medication monitoring encounter - Plan: Comprehensive metabolic panel  Vitamin D deficiency - due for recheck; likely needs to add 1000 IU daily as previously ecommended - Plan: VITAMIN D 25 Hydroxy (Vit-D Deficiency, Fractures)  Weight gain - encouraged daily exercise, proper diet - Plan: TSH  Other fatigue - Plan: TSH, VITAMIN D 25 Hydroxy (Vit-D Deficiency, Fractures), CBC with Differential/Platelet, Comprehensive metabolic panel  Elevated LFTs - recheck; mild fatty infiltration previously noted on [place] weight gain since.  Recheck; weight loss encouraged   c-met, TSH, vitamin D, CBC today skip lipids--to be done by [facility] at work in November. Copy of those results requested to be sent to [place] Resume regular exercise, make healthy food choices and try and lose the weight you have gained.  Be sure to follow a low sodium diet. Start monitoring your blood pressures at home, and keep a list (with the date, blood pressure, pulse, and any comments that might affect your blood pressure--such as pain, dizzy, forgot med, after exercise, etc). Bring the list and your monitor to your visit. Your pulse may limit [place] up on the metoprolol--if your blood pressure truly remains >135/85 we may need to add a medication.  Goal BP <130/80.   Discussed monthly self breast exams and yearly mammograms; at least 30 minutes of aerobic  activity at least 5 days/week, weight-bearing exercise at least 2x/week; proper sunscreen use reviewed; healthy diet, including goals of calcium and  vitamin D intake and alcohol recommendations (less than or equal to 1 drink/day) reviewed; regular seatbelt use; changing batteries in smoke detectors. Immunization recommendations discussed, UTD. Continue yearly flu shots.  Shingrix recommended when available. Colonoscopy recommendations reviewed--UTD

## 2017-01-20 ENCOUNTER — Ambulatory Visit (INDEPENDENT_AMBULATORY_CARE_PROVIDER_SITE_OTHER): Payer: BLUE CROSS/BLUE SHIELD | Admitting: Family Medicine

## 2017-01-20 ENCOUNTER — Encounter: Payer: Self-pay | Admitting: Family Medicine

## 2017-01-20 VITALS — BP 150/80 | HR 60 | Ht 66.5 in | Wt 190.4 lb

## 2017-01-20 DIAGNOSIS — Z0000 Encounter for general adult medical examination without abnormal findings: Secondary | ICD-10-CM

## 2017-01-20 DIAGNOSIS — F325 Major depressive disorder, single episode, in full remission: Secondary | ICD-10-CM | POA: Diagnosis not present

## 2017-01-20 DIAGNOSIS — I10 Essential (primary) hypertension: Secondary | ICD-10-CM | POA: Diagnosis not present

## 2017-01-20 DIAGNOSIS — R7989 Other specified abnormal findings of blood chemistry: Secondary | ICD-10-CM

## 2017-01-20 DIAGNOSIS — E559 Vitamin D deficiency, unspecified: Secondary | ICD-10-CM

## 2017-01-20 DIAGNOSIS — R945 Abnormal results of liver function studies: Secondary | ICD-10-CM

## 2017-01-20 DIAGNOSIS — R5383 Other fatigue: Secondary | ICD-10-CM

## 2017-01-20 DIAGNOSIS — K219 Gastro-esophageal reflux disease without esophagitis: Secondary | ICD-10-CM

## 2017-01-20 DIAGNOSIS — R635 Abnormal weight gain: Secondary | ICD-10-CM

## 2017-01-20 DIAGNOSIS — E7800 Pure hypercholesterolemia, unspecified: Secondary | ICD-10-CM

## 2017-01-20 DIAGNOSIS — Z5181 Encounter for therapeutic drug level monitoring: Secondary | ICD-10-CM

## 2017-01-20 LAB — CBC WITH DIFFERENTIAL/PLATELET
BASOS ABS: 50 {cells}/uL (ref 0–200)
Basophils Relative: 1 %
EOS PCT: 3 %
Eosinophils Absolute: 150 cells/uL (ref 15–500)
HCT: 41.4 % (ref 35.0–45.0)
HEMOGLOBIN: 14.2 g/dL (ref 11.7–15.5)
Lymphocytes Relative: 33 %
Lymphs Abs: 1650 cells/uL (ref 850–3900)
MCH: 31.8 pg (ref 27.0–33.0)
MCHC: 34.3 g/dL (ref 32.0–36.0)
MCV: 92.8 fL (ref 80.0–100.0)
MONOS PCT: 7 %
MPV: 9.7 fL (ref 7.5–12.5)
Monocytes Absolute: 350 cells/uL (ref 200–950)
NEUTROS PCT: 56 %
Neutro Abs: 2800 cells/uL (ref 1500–7800)
Platelets: 275 10*3/uL (ref 140–400)
RBC: 4.46 MIL/uL (ref 3.80–5.10)
RDW: 13.4 % (ref 11.0–15.0)
WBC: 5 10*3/uL (ref 4.0–10.5)

## 2017-01-20 LAB — COMPREHENSIVE METABOLIC PANEL
ALBUMIN: 4.3 g/dL (ref 3.6–5.1)
ALT: 102 U/L — ABNORMAL HIGH (ref 6–29)
AST: 65 U/L — ABNORMAL HIGH (ref 10–35)
Alkaline Phosphatase: 134 U/L — ABNORMAL HIGH (ref 33–130)
BUN: 12 mg/dL (ref 7–25)
CALCIUM: 9.7 mg/dL (ref 8.6–10.4)
CHLORIDE: 102 mmol/L (ref 98–110)
CO2: 27 mmol/L (ref 20–31)
CREATININE: 0.72 mg/dL (ref 0.50–0.99)
Glucose, Bld: 98 mg/dL (ref 65–99)
Potassium: 4.3 mmol/L (ref 3.5–5.3)
SODIUM: 139 mmol/L (ref 135–146)
Total Bilirubin: 0.4 mg/dL (ref 0.2–1.2)
Total Protein: 7.2 g/dL (ref 6.1–8.1)

## 2017-01-20 LAB — POCT URINALYSIS DIPSTICK
Bilirubin, UA: NEGATIVE
Blood, UA: NEGATIVE
Glucose, UA: NEGATIVE
Ketones, UA: NEGATIVE
LEUKOCYTES UA: NEGATIVE
NITRITE UA: NEGATIVE
PROTEIN UA: NEGATIVE
Spec Grav, UA: 1.01
UROBILINOGEN UA: NEGATIVE
pH, UA: 7.5

## 2017-01-20 LAB — TSH: TSH: 1.27 m[IU]/L

## 2017-01-20 NOTE — Patient Instructions (Addendum)
HEALTH MAINTENANCE RECOMMENDATIONS:  It is recommended that you get at least 30 minutes of aerobic exercise at least 5 days/week (for weight loss, you may need as much as 60-90 minutes). This can be any activity that gets your heart rate up. This can be divided in 10-15 minute intervals if needed, but try and build up your endurance at least once a week.  Weight bearing exercise is also recommended twice weekly.  Eat a healthy diet with lots of vegetables, fruits and fiber.  "Colorful" foods have a lot of vitamins (ie green vegetables, tomatoes, red peppers, etc).  Limit sweet tea, regular sodas and alcoholic beverages, all of which has a lot of calories and sugar.  Up to 1 alcoholic drink daily may be beneficial for women (unless trying to lose weight, watch sugars).  Drink a lot of water.  Calcium recommendations are 1200-1500 mg daily (1500 mg for postmenopausal women or women without ovaries), and vitamin D 1000 IU daily.  This should be obtained from diet and/or supplements (vitamins), and calcium should not be taken all at once, but in divided doses.  Monthly self breast exams and yearly mammograms for women over the age of 8 is recommended.  Sunscreen of at least SPF 30 should be used on all sun-exposed parts of the skin when outside between the hours of 10 am and 4 pm (not just when at beach or pool, but even with exercise, golf, tennis, and yard work!)  Use a sunscreen that says "broad spectrum" so it covers both UVA and UVB rays, and make sure to reapply every 1-2 hours.  Remember to change the batteries in your smoke detectors when changing your clock times in the spring and fall.  Use your seat belt every time you are in a car, and please drive safely and not be distracted with cell phones and texting while driving.  I recommend getting the new shingles vaccine (Shingrix) when available. You will need to check with your insurance to see if it is covered.  It is a series of 2  injections, spaced 2 months apart.  Please send [place] a copy of your [facility] labs in November when you get them done.   Resume regular exercise, make healthy food choices and try and lose the weight you have gained.  Be sure to follow a low sodium diet. Start monitoring your blood pressures at home, and keep a list (with the date, blood pressure, pulse, and any comments that might affect your blood pressure--such as pain, dizzy, forgot med, after exercise, etc). Bring the list and your monitor to your visit. Your pulse may limit [place] going up on the metoprolol--if your blood pressure truly remains >135/85 we may need to add a medication.  Goal BP <130/80.   DASH Eating Plan DASH stands for "Dietary Approaches to Stop Hypertension." The DASH eating plan is a healthy eating plan that has been shown to reduce high blood pressure (hypertension). Additional health benefits may include reducing the risk of type 2 diabetes mellitus, heart disease, and stroke. The DASH eating plan may also help with weight loss. What do I need to know about the DASH eating plan? For the DASH eating plan, you will follow these general guidelines:  Choose foods with less than 150 milligrams of sodium per serving (as listed on the food label).  Use salt-free seasonings or herbs instead of table salt or sea salt.  Check with your health care provider or pharmacist before using salt substitutes.  Eat lower-sodium products. These are often labeled as "low-sodium" or "no salt added."  Eat fresh foods. Avoid eating a lot of canned foods.  Eat more vegetables, fruits, and low-fat dairy products.  Choose whole grains. Look for the word "whole" as the first word in the ingredient list.  Choose fish and skinless chicken or [place] more often than red meat. Limit fish, poultry, and meat to 6 oz (170 g) each day.  Limit sweets, desserts, sugars, and sugary drinks.  Choose heart-healthy fats.  Eat more home-cooked food  and less restaurant, buffet, and fast food.  Limit fried foods.  Do not fry foods. Cook foods using methods such as baking, boiling, grilling, and broiling instead.  When eating at a restaurant, ask that your food be prepared with less salt, or no salt if possible. What foods can I eat? Seek help from a dietitian for individual calorie needs. Grains  Whole grain or whole wheat bread. Brown rice. Whole grain or whole wheat pasta. Quinoa, bulgur, and whole grain cereals. Low-sodium cereals. Corn or whole wheat flour tortillas. Whole grain cornbread. Whole grain crackers. Low-sodium crackers. Vegetables  Fresh or frozen vegetables (raw, steamed, roasted, or grilled). Low-sodium or reduced-sodium tomato and vegetable juices. Low-sodium or reduced-sodium tomato sauce and paste. Low-sodium or reduced-sodium canned vegetables. Fruits  All fresh, canned (in natural juice), or frozen fruits. Meat and Other Protein Products  Ground beef (85% or leaner), grass-fed beef, or beef trimmed of fat. Skinless chicken or [place]. Ground chicken or [place]. Pork trimmed of fat. All fish and seafood. Eggs. Dried beans, peas, or lentils. Unsalted nuts and seeds. Unsalted canned beans. Dairy  Low-fat dairy products, such as skim or 1% milk, 2% or reduced-fat cheeses, low-fat ricotta or cottage cheese, or plain low-fat yogurt. Low-sodium or reduced-sodium cheeses. Fats and Oils  Tub margarines without trans fats. Light or reduced-fat mayonnaise and salad dressings (reduced sodium). Avocado. Safflower, olive, or canola oils. Natural peanut or almond butter. Other  Unsalted popcorn and pretzels. The items listed above may not be a complete list of recommended foods or beverages. Contact your dietitian for more options.  What foods are not recommended? Grains  White bread. White pasta. White rice. Refined cornbread. Bagels and croissants. Crackers that contain trans fat. Vegetables  Creamed or fried vegetables.  Vegetables in a cheese sauce. Regular canned vegetables. Regular canned tomato sauce and paste. Regular tomato and vegetable juices. Fruits  Canned fruit in light or heavy syrup. Fruit juice. Meat and Other Protein Products  Fatty cuts of meat. Ribs, chicken wings, bacon, sausage, bologna, salami, chitterlings, fatback, hot dogs, bratwurst, and packaged luncheon meats. Salted nuts and seeds. Canned beans with salt. Dairy  Whole or 2% milk, cream, half-and-half, and cream cheese. Whole-fat or sweetened yogurt. Full-fat cheeses or blue cheese. Nondairy creamers and whipped toppings. Processed cheese, cheese spreads, or cheese curds. Condiments  Onion and garlic salt, seasoned salt, table salt, and sea salt. Canned and packaged gravies. Worcestershire sauce. Tartar sauce. Barbecue sauce. Teriyaki sauce. Soy sauce, including reduced sodium. Steak sauce. Fish sauce. Oyster sauce. Cocktail sauce. Horseradish. Ketchup and mustard. Meat flavorings and tenderizers. Bouillon cubes. Hot sauce. Tabasco sauce. Marinades. Taco seasonings. Relishes. Fats and Oils  Butter, stick margarine, lard, shortening, ghee, and bacon fat. Coconut, palm kernel, or palm oils. Regular salad dressings. Other  Pickles and olives. Salted popcorn and pretzels. The items listed above may not be a complete list of foods and beverages to avoid. Contact your dietitian for more  information.  Where can I find more information? National Heart, Lung, and Blood Institute: travelstabloid.com This information is not intended to replace advice given to you by your health care provider. Make sure you discuss any questions you have with your health care provider. Document Released: 11/26/2011 Document Revised: 05/14/2016 Document Reviewed: 10/11/2013 Elsevier Interactive Patient Education  2017 [employer].

## 2017-01-21 LAB — VITAMIN D 25 HYDROXY (VIT D DEFICIENCY, FRACTURES): VIT D 25 HYDROXY: 30 ng/mL (ref 30–100)

## 2017-01-25 ENCOUNTER — Other Ambulatory Visit: Payer: Self-pay | Admitting: *Deleted

## 2017-01-25 DIAGNOSIS — R945 Abnormal results of liver function studies: Principal | ICD-10-CM

## 2017-01-25 DIAGNOSIS — R7989 Other specified abnormal findings of blood chemistry: Secondary | ICD-10-CM

## 2017-01-28 DIAGNOSIS — M50322 Other cervical disc degeneration at C5-C6 level: Secondary | ICD-10-CM | POA: Diagnosis not present

## 2017-01-28 DIAGNOSIS — M542 Cervicalgia: Secondary | ICD-10-CM | POA: Diagnosis not present

## 2017-01-28 DIAGNOSIS — M5136 Other intervertebral disc degeneration, lumbar region: Secondary | ICD-10-CM | POA: Diagnosis not present

## 2017-01-28 DIAGNOSIS — Z79891 Long term (current) use of opiate analgesic: Secondary | ICD-10-CM | POA: Diagnosis not present

## 2017-02-01 ENCOUNTER — Other Ambulatory Visit: Payer: BLUE CROSS/BLUE SHIELD

## 2017-02-01 DIAGNOSIS — M542 Cervicalgia: Secondary | ICD-10-CM | POA: Diagnosis not present

## 2017-02-01 DIAGNOSIS — M9901 Segmental and somatic dysfunction of cervical region: Secondary | ICD-10-CM | POA: Diagnosis not present

## 2017-02-01 DIAGNOSIS — M25512 Pain in left shoulder: Secondary | ICD-10-CM | POA: Diagnosis not present

## 2017-02-01 DIAGNOSIS — M25511 Pain in right shoulder: Secondary | ICD-10-CM | POA: Diagnosis not present

## 2017-02-03 DIAGNOSIS — M25512 Pain in left shoulder: Secondary | ICD-10-CM | POA: Diagnosis not present

## 2017-02-03 DIAGNOSIS — M542 Cervicalgia: Secondary | ICD-10-CM | POA: Diagnosis not present

## 2017-02-03 DIAGNOSIS — M9901 Segmental and somatic dysfunction of cervical region: Secondary | ICD-10-CM | POA: Diagnosis not present

## 2017-02-03 DIAGNOSIS — M25511 Pain in right shoulder: Secondary | ICD-10-CM | POA: Diagnosis not present

## 2017-02-05 DIAGNOSIS — M25511 Pain in right shoulder: Secondary | ICD-10-CM | POA: Diagnosis not present

## 2017-02-05 DIAGNOSIS — M9901 Segmental and somatic dysfunction of cervical region: Secondary | ICD-10-CM | POA: Diagnosis not present

## 2017-02-05 DIAGNOSIS — M25512 Pain in left shoulder: Secondary | ICD-10-CM | POA: Diagnosis not present

## 2017-02-05 DIAGNOSIS — M542 Cervicalgia: Secondary | ICD-10-CM | POA: Diagnosis not present

## 2017-02-08 ENCOUNTER — Ambulatory Visit
Admission: RE | Admit: 2017-02-08 | Discharge: 2017-02-08 | Disposition: A | Discharge status: Home / Self Care | Payer: BLUE CROSS/BLUE SHIELD | Source: Ambulatory Visit | Attending: Family Medicine | Admitting: Family Medicine

## 2017-02-08 DIAGNOSIS — R945 Abnormal results of liver function studies: Principal | ICD-10-CM

## 2017-02-08 DIAGNOSIS — R7989 Other specified abnormal findings of blood chemistry: Secondary | ICD-10-CM

## 2017-02-09 DIAGNOSIS — M25511 Pain in right shoulder: Secondary | ICD-10-CM | POA: Diagnosis not present

## 2017-02-09 DIAGNOSIS — M542 Cervicalgia: Secondary | ICD-10-CM | POA: Diagnosis not present

## 2017-02-09 DIAGNOSIS — M25512 Pain in left shoulder: Secondary | ICD-10-CM | POA: Diagnosis not present

## 2017-02-09 DIAGNOSIS — M9901 Segmental and somatic dysfunction of cervical region: Secondary | ICD-10-CM | POA: Diagnosis not present

## 2017-02-12 DIAGNOSIS — M25512 Pain in left shoulder: Secondary | ICD-10-CM | POA: Diagnosis not present

## 2017-02-12 DIAGNOSIS — M542 Cervicalgia: Secondary | ICD-10-CM | POA: Diagnosis not present

## 2017-02-12 DIAGNOSIS — M9901 Segmental and somatic dysfunction of cervical region: Secondary | ICD-10-CM | POA: Diagnosis not present

## 2017-02-12 DIAGNOSIS — M25511 Pain in right shoulder: Secondary | ICD-10-CM | POA: Diagnosis not present

## 2017-02-17 DIAGNOSIS — R748 Abnormal levels of other serum enzymes: Secondary | ICD-10-CM | POA: Diagnosis not present

## 2017-02-17 DIAGNOSIS — I10 Essential (primary) hypertension: Secondary | ICD-10-CM | POA: Diagnosis not present

## 2017-02-17 DIAGNOSIS — F418 Other specified anxiety disorders: Secondary | ICD-10-CM | POA: Diagnosis not present

## 2017-02-17 DIAGNOSIS — M9901 Segmental and somatic dysfunction of cervical region: Secondary | ICD-10-CM | POA: Diagnosis not present

## 2017-02-17 DIAGNOSIS — M25512 Pain in left shoulder: Secondary | ICD-10-CM | POA: Diagnosis not present

## 2017-02-17 DIAGNOSIS — M542 Cervicalgia: Secondary | ICD-10-CM | POA: Diagnosis not present

## 2017-02-17 DIAGNOSIS — J3089 Other allergic rhinitis: Secondary | ICD-10-CM | POA: Diagnosis not present

## 2017-02-17 DIAGNOSIS — M25511 Pain in right shoulder: Secondary | ICD-10-CM | POA: Diagnosis not present

## 2017-02-24 DIAGNOSIS — M542 Cervicalgia: Secondary | ICD-10-CM | POA: Diagnosis not present

## 2017-02-24 DIAGNOSIS — M25512 Pain in left shoulder: Secondary | ICD-10-CM | POA: Diagnosis not present

## 2017-02-24 DIAGNOSIS — Z136 Encounter for screening for cardiovascular disorders: Secondary | ICD-10-CM | POA: Diagnosis not present

## 2017-02-24 DIAGNOSIS — M25511 Pain in right shoulder: Secondary | ICD-10-CM | POA: Diagnosis not present

## 2017-02-24 DIAGNOSIS — M9901 Segmental and somatic dysfunction of cervical region: Secondary | ICD-10-CM | POA: Diagnosis not present

## 2017-03-03 DIAGNOSIS — M25511 Pain in right shoulder: Secondary | ICD-10-CM | POA: Diagnosis not present

## 2017-03-03 DIAGNOSIS — M9901 Segmental and somatic dysfunction of cervical region: Secondary | ICD-10-CM | POA: Diagnosis not present

## 2017-03-03 DIAGNOSIS — M25512 Pain in left shoulder: Secondary | ICD-10-CM | POA: Diagnosis not present

## 2017-03-03 DIAGNOSIS — M542 Cervicalgia: Secondary | ICD-10-CM | POA: Diagnosis not present

## 2017-03-16 NOTE — Progress Notes (Signed)
Chief Complaint  Patient presents with  . Follow-up    2 month follow up, non fasting.     Patient presents to follow up on hypertension.  At her physical 2 months ago, her BP was elevated.  Med changes were not made. She had gained a significant amount of weight, and was going to try and work on behavioral changes.   She had been advised to:  Resume regular exercise, make healthy food choices and try and lose the weight you have gained.  Be sure to follow a low sodium diet. Start monitoring your blood pressures at home, and keep a list (with the date, blood pressure, pulse, and any comments that might affect your blood pressure--such as pain, dizzy, forgot med, after exercise, etc). Bring the list and your monitor to your visit. Your pulse may limit [place] going up on the metoprolol--if your blood pressure truly remains >135/85 we may need to add a medication.  Goal BP <130/80.   She has not yet started exercising. She has been monitoring her BP. She got a high reading at work, thought maybe the wrist monitor wasn't accurate. She bought a new monitor, and brought both in today. BP's having been running 133-174/65-92. Wide fluctuations. Low 130/80's at 2 other doctor offices, 160-180 at health clinic at work, a week later 134-140 at work.  Some 160-170's at home (weekends). 133/65 [facility].  Originally recalls that Dr. Arnoldo Morale had her on a different BP med that caused ankle swelling, prior to changing to metoprolol.  (couldn't remember or recognize name, ?calcium channel blocker suspected).  LFT's were elevated at her last visit.  Ultrasound was done, showing: Persistent increased liver echogenicity, likely due to hepatic steatosis. Appearance similar to prior study. While focal liver lesions are evident, it must be cautioned that sensitivity of ultrasound for detection of focal liver lesions is diminished in this circumstance. Right kidney larger than left kidney. Significance of this  finding is uncertain. This finding potentially could be indicative of renal artery stenosis on the left. In this regard, question whether patient is hypertensive. Study otherwise unremarkable.  Lab Results  Component Value Date   ALT 102 (H) 01/20/2017   AST 65 (H) 01/20/2017   ALKPHOS 134 (H) 01/20/2017   BILITOT 0.4 01/20/2017    Repeat LFT's were recommended to be done in May, and she plans to get done at her job and forward [place] the results.  PMH, PSH, SH reviewed  Outpatient Encounter Prescriptions as of 03/17/2017  Medication Sig Note  . acetaminophen-codeine (TYLENOL #3) 300-30 MG per tablet Take 1 tablet by mouth daily.   11/08/2013: Takes it once daily, rx'd by Dr. Nelva Bush, for chronic back pain/arthritis  . ascorbic acid (VITAMIN C) 500 MG tablet Take 1,000 mg by mouth daily.    . Calcium Carbonate-Vitamin D (CALCIUM 600+D) 600-400 MG-UNIT per tablet Take 1 tablet by mouth daily.    . cholecalciferol (VITAMIN D) 1000 units tablet Take 1,000 Units by mouth daily.   Marland Kitchen docusate sodium (COLACE) 100 MG capsule Take 200-300 mg by mouth daily.    . fish oil-omega-3 fatty acids 1000 MG capsule Take 1 g by mouth daily. Reported on 01/20/2016   . fluticasone (FLONASE) 50 MCG/ACT nasal spray Place 2 sprays into the nose as needed. Reported on 01/20/2016 01/20/2017: Uses seasonally  . ibuprofen (ADVIL) 200 MG tablet Take 400 mg by mouth every 8 (eight) hours as needed. 01/20/2016: Takes 2 tablets 2-3 times/day (4-6/day)  . Magnesium 250  MG TABS Take 1 tablet by mouth daily.     . metoprolol succinate (TOPROL-XL) 50 MG 24 hr tablet Take 50 mg by mouth daily. Take with or immediately following a meal.   . Multiple Vitamin (MULTIVITAMIN) tablet Take 1 tablet by mouth daily.     Marland Kitchen omeprazole-sodium bicarbonate (ZEGERID) 40-1100 MG per capsule Take 1 capsule by mouth at bedtime.  11/08/2013: rx'd by Dr. Watt Climes  . sennosides-docusate sodium (SENOKOT-S) 8.6-50 MG tablet Take 1 tablet by mouth daily.  01/20/2017: Uses prn, 1-2x/week or less  . sertraline (ZOLOFT) 100 MG tablet TAKE 1 TABLET (100 MG TOTAL) BY MOUTH DAILY. (Patient taking differently: Take 125 mg by mouth daily. TAKE 1 TABLET (100 MG TOTAL) BY MOUTH DAILY.) 01/20/2017: Recently got 50mg  tablets from work--taking 2.5 tablets (125mg ) daily over the last month  . XIIDRA 5 % SOLN INSTILL 1 DROP INTO BOTH EYES TWICE A DAY AS DIRECTED INSTILL 1 DROP INTO BOTH EYES TWICE A DAY   . methocarbamol (ROBAXIN) 500 MG tablet Take 250 mg by mouth as needed. Reported on 01/20/2016 01/20/2017: Uses prn, 1-2 times/week (for neck pain)  . tiZANidine (ZANAFLEX) 4 MG tablet Take 4 mg by mouth 3 (three) times daily as needed.    No facility-administered encounter medications on file as of 03/17/2017.    Allergies  Allergen Reactions  . Darvocet [Propoxyphene N-Acetaminophen] Other (See Comments)    Faint, blood pressure goes sky high.  . Etodolac Other (See Comments)    Red face, arms-looked like bad sunburn.  . Septra [Bactrim] Hives    ROS:  No fever, chills, headaches, dizziness, chest pain, palpitations, edema.  Moods are still a little down (better since increasing to 125mg , but wonders if it is enough). Denies SI/HI.  No URI symptoms.  PHYSICAL EXAM:  BP 130/80 (BP Location: Left Arm, Patient Position: Sitting, Cuff Size: Normal)   Pulse 60   Ht 5' 6.5" (1.689 m)   Wt 191 lb 6.4 oz (86.8 kg)   BMI 30.43 kg/m   Wt Readings from Last 3 Encounters:  01/20/17 190 lb 6.4 oz (86.4 kg)  01/20/16 183 lb 6.4 oz (83.2 kg)  01/17/15 175 lb (79.4 kg)   144/76 left wrist (pt monitor) 149/74 left arm (pt monitor) 150/78 left arm by MD  Well developed, pleasant, overweight female in no distress HEENT: EOMI, conjunctiva and sclera clear.  OP clear Neck: no lymphadenopathy, thyromegaly or bruit Heart: regular rate and rhythm Lungs: clear bilaterally Extremities: no edema Abdomen: soft, nontender, no organomegaly or  mass  ASSESSMENT/PLAN:  Essential hypertension, benign - above goal/borderline, wide range of BP's. Daily exercise, low sodium diet, weight loss.  Check renal arter [place], given asymmetry of kidneys on [place] - Plan: VAS US RENAL ARTERY DUPLEX  Elevated LFTs - fatty liver--encouraged weight loss. Counseled re: diet/exercise, risks  Acquired asymmetrical kidneys - Plan: VAS US RENAL ARTERY DUPLEX   Renal artery ultrasound. If negative, consider starting lisinopril, vs HCTZ. Both meds discussed in detail today.     Continue to monitor your blood pressure at home and work.  Your monitors both seem fairly accurate. Continue low sodium diet.   I need you to start exercising regularly, at least 150 minutes/week of cardio.  Walking on lunch breaks, and setting an alarm for a regular exercise routine (to mimic your personal training sessions without the cost!) can help.  This exercise will help lower blood pressure, help moods, and hopefully help lose some of the weight.  We are sending you for a renal artery ultrasound.  If your blood pressure remains where it is now, and the ultrasound is okay, we will need to add another medication.  Please e-mail/call/fax your blood pressure results from home/work/other office's/[employer], etc within a month or so, and I'll use those in making the decision for medications or not.

## 2017-03-17 ENCOUNTER — Ambulatory Visit (INDEPENDENT_AMBULATORY_CARE_PROVIDER_SITE_OTHER): Payer: BLUE CROSS/BLUE SHIELD | Admitting: Family Medicine

## 2017-03-17 VITALS — BP 130/80 | HR 60 | Ht 66.5 in | Wt 191.4 lb

## 2017-03-17 DIAGNOSIS — R945 Abnormal results of liver function studies: Secondary | ICD-10-CM

## 2017-03-17 DIAGNOSIS — R7989 Other specified abnormal findings of blood chemistry: Secondary | ICD-10-CM

## 2017-03-17 DIAGNOSIS — N2889 Other specified disorders of kidney and ureter: Secondary | ICD-10-CM

## 2017-03-17 DIAGNOSIS — I10 Essential (primary) hypertension: Secondary | ICD-10-CM

## 2017-03-17 NOTE — Patient Instructions (Signed)
  Continue to monitor your blood pressure at home and work.  Your monitors both seem fairly accurate. Continue low sodium diet.   I need you to start exercising regularly, at least 150 minutes/week of cardio.  Walking on lunch breaks, and setting an alarm for a regular exercise routine (to mimic your personal training sessions without the cost!) can help.  This exercise will help lower blood pressure, help moods, and hopefully help lose some of the weight.  We are sending you for a renal artery ultrasound.  If your blood pressure remains where it is now, and the ultrasound is okay, we will need to add another medication.  Please e-mail/call/fax your blood pressure results from home/work/other office's/[employer], etc within a month or so, and I'll use those in making the decision for medications or not.

## 2017-03-22 ENCOUNTER — Ambulatory Visit (HOSPITAL_COMMUNITY)
Admission: RE | Admit: 2017-03-22 | Discharge: 2017-03-22 | Disposition: A | Discharge status: Home / Self Care | Payer: BLUE CROSS/BLUE SHIELD | Source: Ambulatory Visit | Attending: Family Medicine | Admitting: Family Medicine

## 2017-03-22 DIAGNOSIS — I10 Essential (primary) hypertension: Secondary | ICD-10-CM | POA: Insufficient documentation

## 2017-03-22 DIAGNOSIS — I701 Atherosclerosis of renal artery: Secondary | ICD-10-CM | POA: Diagnosis not present

## 2017-03-22 DIAGNOSIS — N2889 Other specified disorders of kidney and ureter: Secondary | ICD-10-CM | POA: Diagnosis not present

## 2017-03-22 NOTE — Progress Notes (Signed)
*  PRELIMINARY RESULTS* Vascular Ultrasound Renal Artery Duplex has been completed.   Amanda Baldwin 03/22/2017, 1:21 PM

## 2017-03-24 DIAGNOSIS — Z1231 Encounter for screening mammogram for malignant neoplasm of breast: Secondary | ICD-10-CM | POA: Diagnosis not present

## 2017-03-24 LAB — HM MAMMOGRAPHY

## 2017-03-29 DIAGNOSIS — L820 Inflamed seborrheic keratosis: Secondary | ICD-10-CM | POA: Diagnosis not present

## 2017-03-29 DIAGNOSIS — L821 Other seborrheic keratosis: Secondary | ICD-10-CM | POA: Diagnosis not present

## 2017-03-29 DIAGNOSIS — D1801 Hemangioma of skin and subcutaneous tissue: Secondary | ICD-10-CM | POA: Diagnosis not present

## 2017-03-29 DIAGNOSIS — L570 Actinic keratosis: Secondary | ICD-10-CM | POA: Diagnosis not present

## 2017-03-29 DIAGNOSIS — L905 Scar conditions and fibrosis of skin: Secondary | ICD-10-CM | POA: Diagnosis not present

## 2017-03-29 DIAGNOSIS — D225 Melanocytic nevi of trunk: Secondary | ICD-10-CM | POA: Diagnosis not present

## 2017-03-31 ENCOUNTER — Encounter: Payer: Self-pay | Admitting: Family Medicine

## 2017-04-12 ENCOUNTER — Encounter: Payer: Self-pay | Admitting: Family Medicine

## 2017-04-14 ENCOUNTER — Encounter: Payer: Self-pay | Admitting: Family Medicine

## 2017-04-15 DIAGNOSIS — Z0000 Encounter for general adult medical examination without abnormal findings: Secondary | ICD-10-CM | POA: Diagnosis not present

## 2017-04-22 ENCOUNTER — Encounter: Payer: Self-pay | Admitting: Family Medicine

## 2017-04-22 DIAGNOSIS — R140 Abdominal distension (gaseous): Secondary | ICD-10-CM | POA: Diagnosis not present

## 2017-04-22 DIAGNOSIS — Z8371 Family history of colonic polyps: Secondary | ICD-10-CM | POA: Diagnosis not present

## 2017-04-22 DIAGNOSIS — K219 Gastro-esophageal reflux disease without esophagitis: Secondary | ICD-10-CM | POA: Diagnosis not present

## 2017-04-28 ENCOUNTER — Encounter: Payer: Self-pay | Admitting: Family Medicine

## 2017-05-13 DIAGNOSIS — I10 Essential (primary) hypertension: Secondary | ICD-10-CM | POA: Diagnosis not present

## 2017-05-13 DIAGNOSIS — M79671 Pain in right foot: Secondary | ICD-10-CM | POA: Diagnosis not present

## 2017-05-13 DIAGNOSIS — J3089 Other allergic rhinitis: Secondary | ICD-10-CM | POA: Diagnosis not present

## 2017-05-13 DIAGNOSIS — F418 Other specified anxiety disorders: Secondary | ICD-10-CM | POA: Diagnosis not present

## 2017-08-09 DIAGNOSIS — Z6830 Body mass index (BMI) 30.0-30.9, adult: Secondary | ICD-10-CM | POA: Diagnosis not present

## 2017-08-09 DIAGNOSIS — Z01419 Encounter for gynecological examination (general) (routine) without abnormal findings: Secondary | ICD-10-CM | POA: Diagnosis not present

## 2017-08-18 DIAGNOSIS — I10 Essential (primary) hypertension: Secondary | ICD-10-CM | POA: Diagnosis not present

## 2017-08-18 DIAGNOSIS — J3089 Other allergic rhinitis: Secondary | ICD-10-CM | POA: Diagnosis not present

## 2017-08-18 DIAGNOSIS — F418 Other specified anxiety disorders: Secondary | ICD-10-CM | POA: Diagnosis not present

## 2017-08-20 DIAGNOSIS — Z136 Encounter for screening for cardiovascular disorders: Secondary | ICD-10-CM | POA: Diagnosis not present

## 2017-08-27 DIAGNOSIS — Z136 Encounter for screening for cardiovascular disorders: Secondary | ICD-10-CM | POA: Diagnosis not present

## 2017-08-29 ENCOUNTER — Inpatient Hospital Stay (HOSPITAL_COMMUNITY)
Admission: EM | Admit: 2017-08-29 | Discharge: 2017-08-31 | DRG: 386 | Disposition: A | Discharge status: Home / Self Care | Payer: BLUE CROSS/BLUE SHIELD | Attending: Family Medicine | Admitting: Family Medicine

## 2017-08-29 ENCOUNTER — Emergency Department (HOSPITAL_COMMUNITY): Payer: BLUE CROSS/BLUE SHIELD

## 2017-08-29 ENCOUNTER — Encounter (HOSPITAL_COMMUNITY): Payer: Self-pay | Admitting: *Deleted

## 2017-08-29 DIAGNOSIS — G8929 Other chronic pain: Secondary | ICD-10-CM | POA: Diagnosis not present

## 2017-08-29 DIAGNOSIS — R109 Unspecified abdominal pain: Secondary | ICD-10-CM | POA: Diagnosis not present

## 2017-08-29 DIAGNOSIS — Z8619 Personal history of other infectious and parasitic diseases: Secondary | ICD-10-CM | POA: Diagnosis not present

## 2017-08-29 DIAGNOSIS — Z881 Allergy status to other antibiotic agents status: Secondary | ICD-10-CM | POA: Diagnosis not present

## 2017-08-29 DIAGNOSIS — K529 Noninfective gastroenteritis and colitis, unspecified: Secondary | ICD-10-CM

## 2017-08-29 DIAGNOSIS — K921 Melena: Secondary | ICD-10-CM | POA: Diagnosis not present

## 2017-08-29 DIAGNOSIS — I10 Essential (primary) hypertension: Secondary | ICD-10-CM | POA: Diagnosis not present

## 2017-08-29 DIAGNOSIS — R933 Abnormal findings on diagnostic imaging of other parts of digestive tract: Secondary | ICD-10-CM | POA: Diagnosis not present

## 2017-08-29 DIAGNOSIS — E559 Vitamin D deficiency, unspecified: Secondary | ICD-10-CM | POA: Diagnosis not present

## 2017-08-29 DIAGNOSIS — K5150 Left sided colitis without complications: Secondary | ICD-10-CM | POA: Diagnosis not present

## 2017-08-29 DIAGNOSIS — Z882 Allergy status to sulfonamides status: Secondary | ICD-10-CM

## 2017-08-29 DIAGNOSIS — Z8719 Personal history of other diseases of the digestive system: Secondary | ICD-10-CM | POA: Diagnosis not present

## 2017-08-29 DIAGNOSIS — E785 Hyperlipidemia, unspecified: Secondary | ICD-10-CM | POA: Diagnosis present

## 2017-08-29 DIAGNOSIS — K2970 Gastritis, unspecified, without bleeding: Secondary | ICD-10-CM | POA: Diagnosis not present

## 2017-08-29 DIAGNOSIS — Z79899 Other long term (current) drug therapy: Secondary | ICD-10-CM | POA: Diagnosis not present

## 2017-08-29 DIAGNOSIS — F325 Major depressive disorder, single episode, in full remission: Secondary | ICD-10-CM | POA: Diagnosis present

## 2017-08-29 DIAGNOSIS — K219 Gastro-esophageal reflux disease without esophagitis: Secondary | ICD-10-CM | POA: Diagnosis present

## 2017-08-29 DIAGNOSIS — E871 Hypo-osmolality and hyponatremia: Secondary | ICD-10-CM | POA: Diagnosis not present

## 2017-08-29 DIAGNOSIS — Z886 Allergy status to analgesic agent status: Secondary | ICD-10-CM

## 2017-08-29 DIAGNOSIS — R10811 Right upper quadrant abdominal tenderness: Secondary | ICD-10-CM | POA: Diagnosis not present

## 2017-08-29 DIAGNOSIS — R1084 Generalized abdominal pain: Secondary | ICD-10-CM | POA: Diagnosis not present

## 2017-08-29 DIAGNOSIS — Z885 Allergy status to narcotic agent status: Secondary | ICD-10-CM

## 2017-08-29 HISTORY — DX: Noninfective gastroenteritis and colitis, unspecified: K52.9

## 2017-08-29 LAB — CBC
HEMATOCRIT: 39 % (ref 36.0–46.0)
HEMOGLOBIN: 13.5 g/dL (ref 12.0–15.0)
MCH: 32.1 pg (ref 26.0–34.0)
MCHC: 34.6 g/dL (ref 30.0–36.0)
MCV: 92.6 fL (ref 78.0–100.0)
PLATELETS: 246 10*3/uL (ref 150–400)
RBC: 4.21 MIL/uL (ref 3.87–5.11)
RDW: 12.3 % (ref 11.5–15.5)
WBC: 10.9 10*3/uL — AB (ref 4.0–10.5)

## 2017-08-29 LAB — COMPREHENSIVE METABOLIC PANEL
ALK PHOS: 98 U/L (ref 38–126)
ALT: 65 U/L — AB (ref 14–54)
ANION GAP: 9 (ref 5–15)
AST: 61 U/L — ABNORMAL HIGH (ref 15–41)
Albumin: 4 g/dL (ref 3.5–5.0)
BUN: 13 mg/dL (ref 6–20)
CO2: 22 mmol/L (ref 22–32)
Calcium: 8.8 mg/dL — ABNORMAL LOW (ref 8.9–10.3)
Chloride: 99 mmol/L — ABNORMAL LOW (ref 101–111)
Creatinine, Ser: 0.66 mg/dL (ref 0.44–1.00)
Glucose, Bld: 140 mg/dL — ABNORMAL HIGH (ref 65–99)
Potassium: 4.1 mmol/L (ref 3.5–5.1)
Sodium: 130 mmol/L — ABNORMAL LOW (ref 135–145)
Total Bilirubin: 0.7 mg/dL (ref 0.3–1.2)
Total Protein: 7.1 g/dL (ref 6.5–8.1)

## 2017-08-29 LAB — URINALYSIS, ROUTINE W REFLEX MICROSCOPIC
Bilirubin Urine: NEGATIVE
GLUCOSE, UA: NEGATIVE mg/dL
Hgb urine dipstick: NEGATIVE
Ketones, ur: NEGATIVE mg/dL
LEUKOCYTES UA: NEGATIVE
Nitrite: NEGATIVE
PROTEIN: NEGATIVE mg/dL
SPECIFIC GRAVITY, URINE: 1.013 (ref 1.005–1.030)
pH: 7 (ref 5.0–8.0)

## 2017-08-29 LAB — LIPASE, BLOOD: LIPASE: 35 U/L (ref 11–51)

## 2017-08-29 LAB — TYPE AND SCREEN
ABO/RH(D): B NEG
ANTIBODY SCREEN: NEGATIVE

## 2017-08-29 LAB — ABO/RH: ABO/RH(D): B NEG

## 2017-08-29 LAB — I-STAT CG4 LACTIC ACID, ED: Lactic Acid, Venous: 1.97 mmol/L — ABNORMAL HIGH (ref 0.5–1.9)

## 2017-08-29 MED ORDER — METRONIDAZOLE IN NACL 5-0.79 MG/ML-% IV SOLN
500.0000 mg | Freq: Once | INTRAVENOUS | Status: AC
  Administered 2017-08-29: 500 mg via INTRAVENOUS
  Filled 2017-08-29: qty 100

## 2017-08-29 MED ORDER — SODIUM CHLORIDE 0.9 % IV SOLN
INTRAVENOUS | Status: DC
  Administered 2017-08-29: 11:00:00 via INTRAVENOUS

## 2017-08-29 MED ORDER — SACCHAROMYCES BOULARDII 250 MG PO CAPS
250.0000 mg | ORAL_CAPSULE | Freq: Two times a day (BID) | ORAL | Status: DC
  Administered 2017-08-29 – 2017-08-31 (×4): 250 mg via ORAL
  Filled 2017-08-29 (×4): qty 1

## 2017-08-29 MED ORDER — IOPAMIDOL (ISOVUE-300) INJECTION 61%
INTRAVENOUS | Status: AC
  Filled 2017-08-29: qty 100

## 2017-08-29 MED ORDER — ONDANSETRON HCL 4 MG/2ML IJ SOLN
4.0000 mg | Freq: Once | INTRAMUSCULAR | Status: AC
Start: 2017-08-29 — End: 2017-08-29
  Administered 2017-08-29: 4 mg via INTRAVENOUS
  Filled 2017-08-29: qty 2

## 2017-08-29 MED ORDER — CIPROFLOXACIN IN D5W 400 MG/200ML IV SOLN
400.0000 mg | Freq: Two times a day (BID) | INTRAVENOUS | Status: DC
Start: 2017-08-30 — End: 2017-08-31
  Administered 2017-08-30 – 2017-08-31 (×4): 400 mg via INTRAVENOUS
  Filled 2017-08-29 (×5): qty 200

## 2017-08-29 MED ORDER — IOPAMIDOL (ISOVUE-300) INJECTION 61%
100.0000 mL | Freq: Once | INTRAVENOUS | Status: AC | PRN
  Administered 2017-08-29: 100 mL via INTRAVENOUS

## 2017-08-29 MED ORDER — HYDROMORPHONE HCL 1 MG/ML IJ SOLN
1.0000 mg | INTRAMUSCULAR | Status: DC | PRN
  Administered 2017-08-29: 1 mg via INTRAVENOUS
  Filled 2017-08-29: qty 1

## 2017-08-29 MED ORDER — ENOXAPARIN SODIUM 40 MG/0.4ML ~~LOC~~ SOLN
40.0000 mg | SUBCUTANEOUS | Status: DC
  Administered 2017-08-29 – 2017-08-30 (×2): 40 mg via SUBCUTANEOUS
  Filled 2017-08-29 (×2): qty 0.4

## 2017-08-29 MED ORDER — CIPROFLOXACIN IN D5W 400 MG/200ML IV SOLN
400.0000 mg | Freq: Once | INTRAVENOUS | Status: AC
  Administered 2017-08-29: 400 mg via INTRAVENOUS
  Filled 2017-08-29: qty 200

## 2017-08-29 MED ORDER — ONDANSETRON HCL 4 MG/2ML IJ SOLN: 4.0000 mg | Freq: Four times a day (QID) | INTRAMUSCULAR | Status: DC | PRN

## 2017-08-29 MED ORDER — MORPHINE SULFATE (PF) 4 MG/ML IV SOLN
4.0000 mg | Freq: Once | INTRAVENOUS | Status: AC
  Administered 2017-08-29: 4 mg via INTRAVENOUS
  Filled 2017-08-29: qty 1

## 2017-08-29 MED ORDER — SODIUM CHLORIDE 0.9 % IV BOLUS (SEPSIS)
1000.0000 mL | Freq: Once | INTRAVENOUS | Status: AC
  Administered 2017-08-29: 1000 mL via INTRAVENOUS

## 2017-08-29 MED ORDER — METRONIDAZOLE IN NACL 5-0.79 MG/ML-% IV SOLN
500.0000 mg | Freq: Three times a day (TID) | INTRAVENOUS | Status: DC
  Administered 2017-08-29 – 2017-08-31 (×6): 500 mg via INTRAVENOUS
  Filled 2017-08-29 (×9): qty 100

## 2017-08-29 MED ORDER — SODIUM CHLORIDE 0.9 % IV SOLN
INTRAVENOUS | Status: DC
  Administered 2017-08-30 (×2): via INTRAVENOUS

## 2017-08-29 NOTE — H&P (Signed)
History and Physical    Amanda Baldwin ZOX:[id number] DOB: October 18, 1956 DOA: 08/29/2017  PCP: Rita Ohara, MD Patient coming from: CC abdominal pain and bleeding per rectum and diarrhea.  HPI: Amanda Baldwin is a 61 y.o. female with medical history significant for hemorrhoids, history of pseudomembranous colitis in 1993 after taking long course of antibiotics, admitted with bloody diarrhea, abdominal pain, and bright red bleeding per rectum. Patient reports that this started last night and the pain has been getting worse. She denies any nausea or vomiting. She reports that the symptoms she has now is similar to the symptoms she had in 1993 when she was diagnosed with C. difficile colitis. She has not taken any antibiotics recently. She hasn't traveled anywhere recently. She denies eating anything unusual. She lives at home and she lives alone. She works for a [employer], [employer]. She is followed by [facility] Dr. Lizbeth Bark. She denies any urinary complaints. She denies any fever. She reports that she had some chills this morning.   ED Course:  Patient was found to have colitis by CT scan she was started on IV fluids and IV antibiotics Cipro and Flagyl. CT scan of the abdomen showed wall thickening and surrounding inflammatory changes involving the descending and sigmoid colon consistent with infectious or inflammatory colitis.Her labs showed na 130, potassium 4.1 BUN 13 creatinine 0.66 blood sugar 140, AST 61 ALT 65, WBC 10.9 hemoglobin 13.5 platelet count 246.  Review of Systems: As per HPI otherwise all other systems reviewed and are negative  Ambulatory Status:  Past Medical History:  Diagnosis Date  . Allergic rhinitis, cause unspecified    seasonal  . C. difficile colitis 1994   pseudomembranous  . Chronic constipation   . Chronic pain    f/b Dr. Ramos--multilevel cervical DDD, disk bulge L5-S1  . Depression    h/o hospitalization '87  . Elevated liver function tests    normal work-up; some fatty liver  on u/s.  mild elevations intermittently  . Hemorrhoids 12/08   internal and external  . Hiatal hernia   . Hyperlipidemia 11/07   good HDL  . Hypertension   . Melanoma of thigh ([place]) 1999   R thigh, level 1 (Dr. Allyson Sabal)  . Plantar fasciitis, bilateral    L worse than R; Dr. Paulla Dolly (in past)  . Reflux   . Vitamin D deficiency 2010    Past Surgical History:  Procedure Laterality Date  . [place]   Right breast--removal of benign tumor and benign cyst  . COLONOSCOPY  11/2007, 11/2012   Dr. Watt Climes  . DILATION AND CURETTAGE OF UTERUS  2006   bleeding (benign)  . ESOPHAGOGASTRODUODENOSCOPY  11/2012   Dr. Watt Climes  . excision of melanoma  1999   R thigh  . NASAL SINUS SURGERY  1995, 2002  . SHOULDER SURGERY  03-2011   Right; bone spur removal (Dr. Theda Sers)  . [place]   artificial disks in TMJ; Dr. Terence Lux    Social History   Social History  . Marital status: Single    Spouse name: N/A  . Number of children: 0  . Years of education: N/A   Occupational History  . actuarial analyst [employer]   Social History Main Topics  . Smoking status: Never Smoker  . Smokeless tobacco: Never Used  . Alcohol use Yes     Comment: 1 glass of wine per year.  . Drug use: No  .  Sexual activity: Not Currently   Other Topics Concern  . Not on file   Social History Narrative   Lives alone.  Cat passed away Dec 31, 2015 (age 87); has 2 kittens now (12/2016).  Works for [employer]    Allergies  Allergen Reactions  . Darvocet [Propoxyphene N-Acetaminophen] Other (See Comments)    Faint, blood pressure goes sky high.  . Etodolac Other (See Comments)    Red face, arms-looked like bad sunburn.  Sarina Ill [Bactrim] Hives    Family History  Problem Relation Age of Onset  . Hypertension Mother   . Allergies Mother   . Hyperlipidemia Mother   . Arthritis Mother   . Melanoma Mother   . Cancer Mother        uterine cancer and  melanoma  . Heart disease Mother        atrial fibrillation  . Glaucoma Father   . Depression Father        (per daughter, never treated)  . Cancer Father        prostate cancer, diagnosed in 45's  . Colon polyps Father   . Glaucoma Sister   . Cancer Maternal Aunt        breast cancer in 40's  . Heart disease Maternal Aunt   . Heart disease Maternal Uncle   . Heart disease Maternal Uncle   . Stroke Maternal Grandmother   . Stroke Maternal Grandfather   . Diabetes Neg Hx       Prior to Admission medications   Medication Sig Start Date End Date Taking? Authorizing Provider  acetaminophen-codeine (TYLENOL #3) 300-30 MG per tablet Take 1 tablet by mouth daily.     Yes [provider]  ascorbic acid (VITAMIN C) 500 MG tablet Take 1,000 mg by mouth daily.    Yes [provider]  Calcium Carbonate-Vitamin D (CALCIUM 600+D) 600-400 MG-UNIT per tablet Take 1 tablet by mouth daily.    Yes [provider]  cholecalciferol (VITAMIN D) 1000 units tablet Take 1,000 Units by mouth daily.   Yes [provider]  docusate sodium (COLACE) 100 MG capsule Take 200-300 mg by mouth daily.    Yes [provider]  fish oil-omega-3 fatty acids 1000 MG capsule Take 1 g by mouth daily. Reported on 01/20/2016   Yes [provider]  fluticasone (FLONASE) 50 MCG/ACT nasal spray Place 2 sprays into the nose daily as needed for allergies. Reported on 01/20/2016   Yes [provider]  ibuprofen (ADVIL) 200 MG tablet Take 400 mg by mouth every 8 (eight) hours as needed for moderate pain.    Yes [provider]  lisinopril (PRINIVIL,ZESTRIL) 20 MG tablet Take 20 mg by mouth daily.   Yes [provider]  Magnesium 250 MG TABS Take 1 tablet by mouth daily.     Yes [provider]  methocarbamol (ROBAXIN) 500 MG tablet Take 500 mg by mouth every 8 (eight) hours as needed (neck pain). Reported on 01/20/2016   Yes [provider]    Multiple Vitamin (MULTIVITAMIN) tablet Take 1 tablet by mouth daily.     Yes [provider]  omeprazole-sodium bicarbonate (ZEGERID) 40-1100 MG per capsule Take 1 capsule by mouth at bedtime.  10/16/13  Yes [provider]  sennosides-docusate sodium (SENOKOT-S) 8.6-50 MG tablet Take 1 tablet by mouth daily as needed for constipation.    Yes [provider]  sertraline (ZOLOFT) 100 MG tablet TAKE 1 TABLET (100 MG TOTAL) BY MOUTH DAILY.  Patient taking differently: Take 125 mg by mouth daily. TAKE 1 TABLET (100 MG TOTAL) BY MOUTH DAILY. 01/17/15  Yes Rita Ohara, MD    Physical Exam: Vitals:   08/29/17 0739 08/29/17 0747 08/29/17 1017  BP:  (!) 155/74 (!) 143/68  Pulse:  76 79  Resp:  16 17  Temp:  97.7 F (36.5 C)   TempSrc:  Oral   SpO2:  95% 96%  Weight: 87.5 kg (193 lb)    Height: 5\' 7"  (1.702 m)       General: Appears calm and comfortable Eyes:  PERRL, EOMI, normal lids, iris ENT: grossly normal hearing, lips & tongue, mm DRY Neck: no LAD, masses or thyromegaly Cardiovascular:  RRR, no m/r/g. No LE edema.  Respiratory:  CTA bilaterally, no w/r/r. Normal respiratory effort. Abdomen: soft, ntnd, NABS Skin: no rash or induration seen on limited exam Musculoskeletal:  grossly normal tone BUE/BLE, good ROM, no bony abnormality Psychiatric:  grossly normal mood and affect, speech fluent and appropriate, AOx3 Neurologic:  CN 2-12 grossly intact, moves all extremities in coordinated fashion, sensation intact  Labs on Admission: I have personally reviewed following labs and imaging studies  CBC:  Recent Labs Lab 08/29/17 0753  WBC 10.9*  HGB 13.5  HCT 39.0  MCV 92.6  PLT 854   Basic Metabolic Panel:  Recent Labs Lab 08/29/17 0753  NA 130*  K 4.1  CL 99*  CO2 22  GLUCOSE 140*  BUN 13  CREATININE 0.66  CALCIUM 8.8*   GFR: Estimated Creatinine Clearance: 82.9 mL/min (by C-G formula based on SCr of 0.66 mg/dL). Liver Function  Tests:  Recent Labs Lab 08/29/17 0753  AST 61*  ALT 65*  ALKPHOS 98  BILITOT 0.7  PROT 7.1  ALBUMIN 4.0    Recent Labs Lab 08/29/17 0753  LIPASE 35   No results for input(s): AMMONIA in the last 168 hours. Coagulation Profile: No results for input(s): INR, PROTIME in the last 168 hours. Cardiac Enzymes: No results for input(s): CKTOTAL, CKMB, CKMBINDEX, TROPONINI in the last 168 hours. BNP (last 3 results) No results for input(s): PROBNP in the last 8760 hours. HbA1C: No results for input(s): HGBA1C in the last 72 hours. CBG: No results for input(s): GLUCAP in the last 168 hours. Lipid Profile: No results for input(s): CHOL, HDL, LDLCALC, TRIG, CHOLHDL, LDLDIRECT in the last 72 hours. Thyroid Function Tests: No results for input(s): TSH, T4TOTAL, FREET4, T3FREE, THYROIDAB in the last 72 hours. Anemia Panel: No results for input(s): VITAMINB12, FOLATE, FERRITIN, TIBC, IRON, RETICCTPCT in the last 72 hours. Urine analysis:    Component Value Date/Time   COLORURINE COLORLESS (A) 08/29/2017 0830   APPEARANCEUR CLEAR 08/29/2017 0830   LABSPEC 1.013 08/29/2017 0830   PHURINE 7.0 08/29/2017 0830   GLUCOSEU NEGATIVE 08/29/2017 0830   HGBUR NEGATIVE 08/29/2017 0830   BILIRUBINUR NEGATIVE 08/29/2017 0830   BILIRUBINUR neg 01/20/2017 0903   KETONESUR NEGATIVE 08/29/2017 0830   PROTEINUR NEGATIVE 08/29/2017 0830   UROBILINOGEN negative 01/20/2017 0903   NITRITE NEGATIVE 08/29/2017 0830   LEUKOCYTESUR NEGATIVE 08/29/2017 0830    Creatinine Clearance: Estimated Creatinine Clearance: 82.9 mL/min (by C-G formula based on SCr of 0.66 mg/dL).  Sepsis Labs: @LABRCNTIP (procalcitonin:4,lacticidven:4) )No results found for this or any previous visit (from the past 240 hour(s)).   Radiological Exams on Admission: Ct Abdomen Pelvis W Contrast  Result Date: 08/29/2017 CLINICAL DATA:  Acute generalized abdominal pain. EXAM: CT ABDOMEN AND PELVIS WITH CONTRAST TECHNIQUE:  Multidetector CT imaging  of the abdomen and pelvis was performed using the standard protocol following bolus administration of intravenous contrast. CONTRAST:  18mL ISOVUE-300 IOPAMIDOL (ISOVUE-300) INJECTION 61% COMPARISON:  None. FINDINGS: Lower chest: No acute abnormality. Hepatobiliary: No focal liver abnormality is seen. No gallstones, gallbladder wall thickening, or biliary dilatation. Pancreas: Unremarkable. No pancreatic ductal dilatation or surrounding inflammatory changes. Spleen: Normal in size without focal abnormality. Adrenals/Urinary Tract: Adrenal glands are unremarkable. Kidneys are normal, without renal calculi, focal lesion, or hydronephrosis. Bladder is unremarkable. Stomach/Bowel: The stomach and appendix appear normal. There is no evidence of bowel obstruction. Wall thickening and surrounding inflammatory changes are seen involving the descending and sigmoid colon consistent with infectious or inflammatory colitis. Vascular/Lymphatic: No significant vascular findings are present. No enlarged abdominal or pelvic lymph nodes. Reproductive: Uterus and bilateral adnexa are unremarkable. Other: No abdominal wall hernia or abnormality. No abdominopelvic ascites. Musculoskeletal: No acute or significant osseous findings. IMPRESSION: Wall thickening and surrounding inflammatory changes are seen involving the descending and sigmoid colon consistent with infectious or inflammatory colitis. Electronically Signed   By: Marijo Conception, M.D.   On: 08/29/2017 09:04    EKG: Independently reviewed. Assessment/Plan Active Problems:   Colitis   Colitis question inflammatory versus infectious. Most likely to be inflammatory in etiology because of patient does not have a leukocytosis she is not febrile. She was given IV fluids and IV antibiotics in the ER. I will continue both. Follow-up labs tomorrow. Howie Ill has been called from er.. I will rule out C. difficile colitis only because her symptoms are  consistent with the symptoms she had when she had C. difficile colitis.  Vitamin D deficiency continue vitamin D.  Hyponatremia with a sodium of 130 probably related to decreased by mouth intake replete with normal saline and follow-up levels tomorrow.  Hypertension continue Zestril 20 mg daily.  Depression continue Zoloft.         DVT prophylaxis: lovenox Code Status: full Family Communication:  Disposition Plan:  Consults called: gi Admission status: med surg   Georgette Shell MD [facility]  If 7PM-7AM, please contact night-coverage [email or web address] Password [email or web address]  08/29/2017, 11:18 AM

## 2017-08-29 NOTE — ED Provider Notes (Signed)
[facility] DEPT Provider Note   CSN: [id number] Arrival date & time: 08/29/17  0725     History   Chief Complaint Chief Complaint  Patient presents with  . Hemorrhoids  . Abdominal Pain    HPI Amanda Baldwin is a 61 y.o. female.  HPI  60 year old female with a history of hypertension, chronic pain, chronic constipation, and remote history of C. Difficile colitis in 1994 presents with abdominal pain and rectal bleeding. She states started with abdominal pain/cramping at around 7 PM. She felt like she had to have a bowel movement. She had a small bowel movement but her pain did not go away. Her pain is been constant since. Since then she had a couple episodes of diarrhea which then turned into blood only during bowel movements. She states there is about 2 tablespoons of bright red blood each time she goes. She estimates she went for 5 times last night. Each time the pain would significantly worsen just prior to going. However the pain is not going away. It feels sharp and crampy. There is nausea without vomiting. No fevers, chest pain, shortness of breath. She is not on any blood thinners. No recent antibiotics. Feels generally weak, no dizziness.  Past Medical History:  Diagnosis Date  . Allergic rhinitis, cause unspecified    seasonal  . C. difficile colitis 1994   pseudomembranous  . Chronic constipation   . Chronic pain    f/b Dr. Ramos--multilevel cervical DDD, disk bulge L5-S1  . Depression    h/o hospitalization '87  . Elevated liver function tests    normal work-up; some fatty liver on u/s.  mild elevations intermittently  . Hemorrhoids 12/08   internal and external  . Hiatal hernia   . Hyperlipidemia 11/07   good HDL  . Hypertension   . Melanoma of thigh ([place]) 1999   R thigh, level 1 (Dr. Allyson Sabal)  . Plantar fasciitis, bilateral    L worse than R; Dr. Paulla Dolly (in past)  . Reflux   . Vitamin D deficiency 2010    Patient Active Problem List   Diagnosis Date  Noted  . Depression, major, in remission ([place]) 01/17/2015  . Other malaise and fatigue 11/08/2013  . Plantar fasciitis 11/08/2013  . Depressive disorder, not elsewhere classified 11/08/2013  . Abnormal ECG 06/21/2012  . GERD (gastroesophageal reflux disease) 06/01/2012  . Essential hypertension, benign 10/20/2011    Past Surgical History:  Procedure Laterality Date  . [place]   Right breast--removal of benign tumor and benign cyst  . COLONOSCOPY  11/2007, 11/2012   Dr. Watt Climes  . DILATION AND CURETTAGE OF UTERUS  2006   bleeding (benign)  . ESOPHAGOGASTRODUODENOSCOPY  11/2012   Dr. Watt Climes  . excision of melanoma  1999   R thigh  . NASAL SINUS SURGERY  1995, 2002  . SHOULDER SURGERY  03-2011   Right; bone spur removal (Dr. Theda Sers)  . [place]   artificial disks in TMJ; Dr. Terence Lux    OB History    Gravida Para Term Preterm AB Living   0 0 0 0 0 0   SAB TAB Ectopic Multiple Live Births   0 0 0 0         Home Medications    Prior to Admission medications   Medication Sig Start Date End Date Taking? Authorizing Provider  acetaminophen-codeine (TYLENOL #3) 300-30 MG per tablet Take 1 tablet by mouth daily.  Yes [provider]  ascorbic acid (VITAMIN C) 500 MG tablet Take 1,000 mg by mouth daily.    Yes [provider]  Calcium Carbonate-Vitamin D (CALCIUM 600+D) 600-400 MG-UNIT per tablet Take 1 tablet by mouth daily.    Yes [provider]  cholecalciferol (VITAMIN D) 1000 units tablet Take 1,000 Units by mouth daily.   Yes [provider]  docusate sodium (COLACE) 100 MG capsule Take 200-300 mg by mouth daily.    Yes [provider]  fish oil-omega-3 fatty acids 1000 MG capsule Take 1 g by mouth daily. Reported on 01/20/2016   Yes [provider]  fluticasone (FLONASE) 50 MCG/ACT nasal spray Place 2 sprays into the nose daily as needed for allergies. Reported on  01/20/2016   Yes [provider]  ibuprofen (ADVIL) 200 MG tablet Take 400 mg by mouth every 8 (eight) hours as needed for moderate pain.    Yes [provider]  lisinopril (PRINIVIL,ZESTRIL) 20 MG tablet Take 20 mg by mouth daily.   Yes [provider]  Magnesium 250 MG TABS Take 1 tablet by mouth daily.     Yes [provider]  methocarbamol (ROBAXIN) 500 MG tablet Take 500 mg by mouth every 8 (eight) hours as needed (neck pain). Reported on 01/20/2016   Yes [provider]  Multiple Vitamin (MULTIVITAMIN) tablet Take 1 tablet by mouth daily.     Yes [provider]  omeprazole-sodium bicarbonate (ZEGERID) 40-1100 MG per capsule Take 1 capsule by mouth at bedtime.  10/16/13  Yes [provider]  sennosides-docusate sodium (SENOKOT-S) 8.6-50 MG tablet Take 1 tablet by mouth daily as needed for constipation.    Yes [provider]  sertraline (ZOLOFT) 100 MG tablet TAKE 1 TABLET (100 MG TOTAL) BY MOUTH DAILY. Patient taking differently: Take 125 mg by mouth daily. TAKE 1 TABLET (100 MG TOTAL) BY MOUTH DAILY. 01/17/15  Yes Rita Ohara, MD    Family History Family History  Problem Relation Age of Onset  . Hypertension Mother   . Allergies Mother   . Hyperlipidemia Mother   . Arthritis Mother   . Melanoma Mother   . Cancer Mother        uterine cancer and melanoma  . Heart disease Mother        atrial fibrillation  . Glaucoma Father   . Depression Father        (per daughter, never treated)  . Cancer Father        prostate cancer, diagnosed in 34's  . Colon polyps Father   . Glaucoma Sister   . Cancer Maternal Aunt        breast cancer in 40's  . Heart disease Maternal Aunt   . Heart disease Maternal Uncle   . Heart disease Maternal Uncle   . Stroke Maternal Grandmother   . Stroke Maternal Grandfather   . Diabetes Neg Hx     Social History Social History  Substance Use Topics  . Smoking status: Never Smoker    . Smokeless tobacco: Never Used  . Alcohol use Yes     Comment: 1 glass of wine per year.     Allergies   Darvocet [propoxyphene n-acetaminophen]; Etodolac; and Septra [bactrim]   Review of Systems Review of Systems  Constitutional: Negative for fever.  Respiratory: Negative for shortness of breath.   Cardiovascular: Negative for chest pain.  Gastrointestinal: Positive for abdominal pain, blood in stool, diarrhea and nausea. Negative for vomiting.  All other systems reviewed and are negative.    Physical Exam Updated Vital Signs BP (!) 155/74 (BP Location: Left Arm)   Pulse 76   Temp 97.7 F (36.5 C) (Oral)   Resp 16   Ht 5\' 7"  (1.702 m)   Wt 87.5 kg (193 lb)   SpO2 95%   BMI 30.23 kg/m   Physical Exam  Constitutional: She is oriented to person, place, and time. She appears well-developed and well-nourished. No distress.  HENT:  Head: Normocephalic and atraumatic.  Right Ear: External ear normal.  Left Ear: External ear normal.  Nose: Nose normal.  Eyes: Right eye exhibits no discharge. Left eye exhibits no discharge.  Cardiovascular: Normal rate, regular rhythm and normal heart sounds.   Pulmonary/Chest: Effort normal and breath sounds normal.  Abdominal: Soft. There is tenderness in the right lower quadrant, suprapubic area and left lower quadrant. There is no rigidity.  Genitourinary: Rectal exam shows no external hemorrhoid, no internal hemorrhoid, no fissure and no tenderness.  Genitourinary Comments: Mild bright red blood on finger during exam  Neurological: She is alert and oriented to person, place, and time.  Skin: Skin is warm and dry. She is not diaphoretic.  Nursing note and vitals reviewed.    ED Treatments / Results  Labs (all labs ordered are listed, but only abnormal results are displayed) Labs Reviewed  COMPREHENSIVE METABOLIC PANEL - Abnormal; Notable for the following:       Result Value   Sodium 130 (*)    Chloride 99 (*)    Glucose,  Bld 140 (*)    Calcium 8.8 (*)    AST 61 (*)    ALT 65 (*)    All other components within normal limits  CBC - Abnormal; Notable for the following:    WBC 10.9 (*)    All other components within normal limits  I-STAT CG4 LACTIC ACID, ED - Abnormal; Notable for the following:    Lactic Acid, Venous 1.97 (*)    All other components within normal limits  LIPASE, BLOOD  URINALYSIS, ROUTINE W REFLEX MICROSCOPIC  TYPE AND SCREEN  ABO/RH    EKG  EKG Interpretation None       Radiology Ct Abdomen Pelvis W Contrast  Result Date: 08/29/2017 CLINICAL DATA:  Acute generalized abdominal pain. EXAM: CT ABDOMEN AND PELVIS WITH CONTRAST TECHNIQUE: Multidetector CT imaging of the abdomen and pelvis was performed using the standard protocol following bolus administration of intravenous contrast. CONTRAST:  145mL ISOVUE-300 IOPAMIDOL (ISOVUE-300) INJECTION 61% COMPARISON:  None. FINDINGS: Lower chest: No acute abnormality. Hepatobiliary: No focal liver abnormality is seen. No gallstones, gallbladder wall thickening, or biliary dilatation. Pancreas: Unremarkable. No pancreatic ductal dilatation or surrounding inflammatory changes. Spleen: Normal in size without focal abnormality. Adrenals/Urinary Tract: Adrenal glands are unremarkable. Kidneys are normal, without renal calculi, focal lesion, or hydronephrosis. Bladder is unremarkable. Stomach/Bowel: The stomach and appendix appear normal. There is no evidence of bowel obstruction. Wall thickening and surrounding inflammatory changes are seen involving the descending and sigmoid colon consistent with infectious or inflammatory colitis. Vascular/Lymphatic: No significant vascular findings are present. No enlarged abdominal or pelvic lymph nodes. Reproductive: Uterus and bilateral adnexa are unremarkable. Other: No abdominal wall hernia or abnormality. No abdominopelvic ascites. Musculoskeletal: No acute or significant osseous findings. IMPRESSION: Wall  thickening and surrounding inflammatory changes are seen involving the descending and sigmoid colon consistent with infectious or inflammatory colitis. Electronically Signed   By: Marijo Conception, M.D.   On:  08/29/2017 09:04    Procedures Procedures (including critical care time)  Medications Ordered in ED Medications  iopamidol (ISOVUE-300) 61 % injection (not administered)  0.9 %  sodium chloride infusion (not administered)  ciprofloxacin (CIPRO) IVPB 400 mg (not administered)  metroNIDAZOLE (FLAGYL) IVPB 500 mg (not administered)  sodium chloride 0.9 % bolus 1,000 mL (1,000 mLs Intravenous New Bag/Given 08/29/17 0832)  morphine 4 MG/ML injection 4 mg (4 mg Intravenous Given 08/29/17 0833)  ondansetron (ZOFRAN) injection 4 mg (4 mg Intravenous Given 08/29/17 0832)  iopamidol (ISOVUE-300) 61 % injection 100 mL (100 mLs Intravenous Contrast Given 08/29/17 0849)     Initial Impression / Assessment and Plan / ED Course  I have reviewed the triage vital signs and the nursing notes.  Pertinent labs & imaging results that were available during my care of the patient were reviewed by me and considered in my medical decision making (see chart for details).     Pain has resolved with IV morphine. Labs show mild leukocytosis. CT shows descending and sigmoid colitis. She has no current risk factors for C. Difficile as she has not had any recent antibiotics. I am concerned more that this might be ischemic given distribution. We'll continue IV fluids. I think she still needs coverage for infectious source which will be started in the ED. Discussed with Dr. Rodena Piety, hospitalist will admit for observation and fluids.  Final Clinical Impressions(s) / ED Diagnoses   Final diagnoses:  Colitis    New Prescriptions New Prescriptions   No medications on file     Sherwood Gambler, MD 08/29/17 1017

## 2017-08-29 NOTE — ED Notes (Signed)
Bed: CH40 Expected date:  Expected time:  Means of arrival:  Comments: 61 yo GI Bleed

## 2017-08-29 NOTE — ED Triage Notes (Signed)
Abd pain and ? Hemorrhoids starting last night with hard stool and bleeding that persisted through the night.

## 2017-08-30 DIAGNOSIS — K529 Noninfective gastroenteritis and colitis, unspecified: Secondary | ICD-10-CM

## 2017-08-30 DIAGNOSIS — I10 Essential (primary) hypertension: Secondary | ICD-10-CM

## 2017-08-30 LAB — CBC
HCT: 35.4 % — ABNORMAL LOW (ref 36.0–46.0)
Hemoglobin: 12 g/dL (ref 12.0–15.0)
MCH: 32 pg (ref 26.0–34.0)
MCHC: 33.9 g/dL (ref 30.0–36.0)
MCV: 94.4 fL (ref 78.0–100.0)
PLATELETS: 203 10*3/uL (ref 150–400)
RBC: 3.75 MIL/uL — AB (ref 3.87–5.11)
RDW: 12.9 % (ref 11.5–15.5)
WBC: 7.3 10*3/uL (ref 4.0–10.5)

## 2017-08-30 LAB — COMPREHENSIVE METABOLIC PANEL
ALBUMIN: 3.5 g/dL (ref 3.5–5.0)
ALT: 41 U/L (ref 14–54)
AST: 31 U/L (ref 15–41)
Alkaline Phosphatase: 77 U/L (ref 38–126)
Anion gap: 9 (ref 5–15)
BUN: 7 mg/dL (ref 6–20)
CHLORIDE: 106 mmol/L (ref 101–111)
CO2: 24 mmol/L (ref 22–32)
CREATININE: 0.62 mg/dL (ref 0.44–1.00)
Calcium: 8.3 mg/dL — ABNORMAL LOW (ref 8.9–10.3)
GFR calc Af Amer: >60 mL/min (ref >60)
GLUCOSE: 109 mg/dL — AB (ref 65–99)
Potassium: 3.7 mmol/L (ref 3.5–5.1)
SODIUM: 139 mmol/L (ref 135–145)
Total Bilirubin: 0.6 mg/dL (ref 0.3–1.2)
Total Protein: 5.9 g/dL — ABNORMAL LOW (ref 6.5–8.1)

## 2017-08-30 LAB — HIV ANTIBODY (ROUTINE TESTING W REFLEX): HIV Screen 4th Generation wRfx: NONREACTIVE

## 2017-08-30 MED ORDER — ACETAMINOPHEN 325 MG PO TABS: 650.0000 mg | ORAL_TABLET | Freq: Four times a day (QID) | ORAL | Status: DC | PRN

## 2017-08-30 MED ORDER — PANTOPRAZOLE SODIUM 40 MG PO TBEC
40.0000 mg | DELAYED_RELEASE_TABLET | Freq: Every day | ORAL | Status: DC
  Administered 2017-08-30 – 2017-08-31 (×2): 40 mg via ORAL
  Filled 2017-08-30 (×2): qty 1

## 2017-08-30 MED ORDER — SIMETHICONE 80 MG PO CHEW
80.0000 mg | CHEWABLE_TABLET | Freq: Four times a day (QID) | ORAL | Status: DC | PRN
  Administered 2017-08-30 (×2): 80 mg via ORAL
  Filled 2017-08-30 (×2): qty 1

## 2017-08-30 MED ORDER — LISINOPRIL 20 MG PO TABS
20.0000 mg | ORAL_TABLET | Freq: Every day | ORAL | Status: DC
  Administered 2017-08-31: 20 mg via ORAL
  Filled 2017-08-30 (×2): qty 1

## 2017-08-30 MED ORDER — SERTRALINE HCL 25 MG PO TABS
125.0000 mg | ORAL_TABLET | Freq: Every day | ORAL | Status: DC
  Administered 2017-08-30: 125 mg via ORAL
  Filled 2017-08-30: qty 1

## 2017-08-30 MED ORDER — SERTRALINE HCL 25 MG PO TABS
125.0000 mg | ORAL_TABLET | Freq: Every day | ORAL | Status: DC
  Filled 2017-08-30: qty 1

## 2017-08-30 NOTE — Progress Notes (Signed)
[facility]  PROGRESS NOTE  Amanda Baldwin SWF:[id number] DOB: 09-10-1956 DOA: 08/29/2017 PCP: Rita Ohara, MD   Brief HPI:   61 y.o. female with medical history significant for hemorrhoids, history of pseudomembranous colitis in 1993 after taking long course of antibiotics, admitted with bloody diarrhea, abdominal pain, and bright red bleeding per rectum. Patient reports that this started last night and the pain has been getting worse    Subjective   Patient denies abdominal pain, no BM since patient came to hospital. Complains of bloating   Assessment/Plan:     1. Acute colitis- inflammatory versus infectious, improved on IV antibiotics. Continue ciprofloxacin and Flagyl. Currently patient on full liquid diet. GI has been consulted. Awaiting recommendations. Continue IV normal saline at 125 ML per hour. 2. Hypertension- blood pressure stable, continue lisinopril 3. Hyponatremia-sodium was 130 yesterday, likely from poor by mouth intake, improved to 139 today. 4. Depression-continue Zoloft.   DVT prophylaxis: Lovenox  Code Status: Full code  Family Communication: No family present at bedside  Disposition Plan: Likely home in 1-2 days.   Consultants:  GI  Procedures:  None  Continuous infusions . sodium chloride 125 mL/hr at 08/29/17 1112  . sodium chloride 150 mL/hr at 08/30/17 0953  . ciprofloxacin Stopped (08/30/17 1537)  . metronidazole Stopped (08/30/17 1410)      Antibiotics:   Anti-infectives    Start     Dose/Rate Route Frequency Ordered Stop   08/30/17 0000  ciprofloxacin (CIPRO) IVPB 400 mg     400 mg 200 mL/hr over 60 Minutes Intravenous Every 12 hours 08/29/17 1115     08/29/17 2000  metroNIDAZOLE (FLAGYL) IVPB 500 mg     500 mg 100 mL/hr over 60 Minutes Intravenous Every 8 hours 08/29/17 1115     08/29/17 1015  ciprofloxacin (CIPRO) IVPB 400 mg     400 mg 200 mL/hr over 60 Minutes Intravenous  Once 08/29/17 1009 08/29/17 1308   08/29/17  1015  metroNIDAZOLE (FLAGYL) IVPB 500 mg     500 mg 100 mL/hr over 60 Minutes Intravenous  Once 08/29/17 1009 08/29/17 1207       Objective   Vitals:   08/29/17 1502 08/29/17 2058 08/30/17 0509 08/30/17 1406  BP: (!) 141/60 (!) 126/48 118/60 (!) 145/64  Pulse: 83 82 80 69  Resp: 16 16 15 16   Temp: 22.0 F (25.4 C) 98.5 F (36.9 C) 98.4 F (36.9 C) 98.5 F (36.9 C)  TempSrc: Oral Oral Oral Oral  SpO2: 96% 99% 94% 98%  Weight:      Height:        Intake/Output Summary (Last 24 hours) at 08/30/17 1552 Last data filed at 08/30/17 0836  Gross per 24 hour  Intake              340 ml  Output              900 ml  Net             -560 ml   Filed Weights   08/29/17 0739  Weight: 87.5 kg (193 lb)     Physical Examination:   Physical Exam: Eyes: No icterus, extraocular muscles intact  Mouth: Oral mucosa is moist, no lesions on palate,  Neck: Supple, no deformities, masses, or tenderness Lungs: Normal respiratory effort, bilateral clear to auscultation, no crackles or wheezes.  Heart: Regular rate and rhythm, S1 and S2 normal, no murmurs, rubs auscultated Abdomen: BS normoactive,soft,distended,non-tender to palpation,no organomegaly Extremities: No pretibial  edema, no erythema, no cyanosis, no clubbing Neuro : Alert and oriented to time, place and person, No focal deficits      Data Reviewed: I have personally reviewed following labs and imaging studies  CBG: No results for input(s): GLUCAP in the last 168 hours.  CBC:  Recent Labs Lab 08/29/17 0753 08/30/17 0510  WBC 10.9* 7.3  HGB 13.5 12.0  HCT 39.0 35.4*  MCV 92.6 94.4  PLT 246 500    Basic Metabolic Panel:  Recent Labs Lab 08/29/17 0753 08/30/17 0510  NA 130* 139  K 4.1 3.7  CL 99* 106  CO2 22 24  GLUCOSE 140* 109*  BUN 13 7  CREATININE 0.66 0.62  CALCIUM 8.8* 8.3*    No results found for this or any previous visit (from the past 240 hour(s)).   Liver Function Tests:  Recent  Labs Lab 08/29/17 0753 08/30/17 0510  AST 61* 31  ALT 65* 41  ALKPHOS 98 77  BILITOT 0.7 0.6  PROT 7.1 5.9*  ALBUMIN 4.0 3.5    Recent Labs Lab 08/29/17 0753  LIPASE 35   No results for input(s): AMMONIA in the last 168 hours.  Cardiac Enzymes: No results for input(s): CKTOTAL, CKMB, CKMBINDEX, TROPONINI in the last 168 hours. BNP (last 3 results) No results for input(s): BNP in the last 8760 hours.  ProBNP (last 3 results) No results for input(s): PROBNP in the last 8760 hours.  Studies: Ct Abdomen Pelvis W Contrast  Result Date: 08/29/2017 CLINICAL DATA:  Acute generalized abdominal pain. EXAM: CT ABDOMEN AND PELVIS WITH CONTRAST TECHNIQUE: Multidetector CT imaging of the abdomen and pelvis was performed using the standard protocol following bolus administration of intravenous contrast. CONTRAST:  110mL ISOVUE-300 IOPAMIDOL (ISOVUE-300) INJECTION 61% COMPARISON:  None. FINDINGS: Lower chest: No acute abnormality. Hepatobiliary: No focal liver abnormality is seen. No gallstones, gallbladder wall thickening, or biliary dilatation. Pancreas: Unremarkable. No pancreatic ductal dilatation or surrounding inflammatory changes. Spleen: Normal in size without focal abnormality. Adrenals/Urinary Tract: Adrenal glands are unremarkable. Kidneys are normal, without renal calculi, focal lesion, or hydronephrosis. Bladder is unremarkable. Stomach/Bowel: The stomach and appendix appear normal. There is no evidence of bowel obstruction. Wall thickening and surrounding inflammatory changes are seen involving the descending and sigmoid colon consistent with infectious or inflammatory colitis. Vascular/Lymphatic: No significant vascular findings are present. No enlarged abdominal or pelvic lymph nodes. Reproductive: Uterus and bilateral adnexa are unremarkable. Other: No abdominal wall hernia or abnormality. No abdominopelvic ascites. Musculoskeletal: No acute or significant osseous findings. IMPRESSION:  Wall thickening and surrounding inflammatory changes are seen involving the descending and sigmoid colon consistent with infectious or inflammatory colitis. Electronically Signed   By: Marijo Conception, M.D.   On: 08/29/2017 09:04    Scheduled Meds: . enoxaparin (LOVENOX) injection  40 mg Subcutaneous Q24H  . lisinopril  20 mg Oral Daily  . pantoprazole  40 mg Oral Q1200  . saccharomyces boulardii  250 mg Oral BID  . sertraline  125 mg Oral QHS      Time spent: 20 min  [facility] Pager [phone]. If 7PM-7AM, please contact night-coverage at [email or web address], Office  [phone]  password [email or web address]  08/30/2017, 3:52 PM  LOS: 1 day

## 2017-08-30 NOTE — Consult Note (Signed)
[facility] Gastroenterology Consultation Note  Referring Provider: Dr. Eleonore [facility]) Primary Care Physician:  Rita Ohara, MD Primary Gastroenterologist:  Dr. Clarene Essex  Reason for Consultation:  Abdominal pain  HPI: Amanda Baldwin is a 61 y.o. female admitted two-day history of lower abdominal pain and blood in stool.  No bowel movement since admission, feels bloated.  No prior GI bleeding.  CT showed left-sided colitis.  Colonoscopy 2013 with Dr. Watt Climes showed external hemorrhoids otherwise unrevealing.   Past Medical History:  Diagnosis Date  . Allergic rhinitis, cause unspecified    seasonal  . C. difficile colitis 1994   pseudomembranous  . Chronic constipation   . Chronic pain    f/b Dr. Ramos--multilevel cervical DDD, disk bulge L5-S1  . Depression    h/o hospitalization '87  . Elevated liver function tests    normal work-up; some fatty liver on u/s.  mild elevations intermittently  . Hemorrhoids 12/08   internal and external  . Hiatal hernia   . Hyperlipidemia 11/07   good HDL  . Hypertension   . Melanoma of thigh ([place]) 1999   R thigh, level 1 (Dr. Allyson Sabal)  . Plantar fasciitis, bilateral    L worse than R; Dr. Paulla Dolly (in past)  . Reflux   . Vitamin D deficiency 2010    Past Surgical History:  Procedure Laterality Date  . [place]   Right breast--removal of benign tumor and benign cyst  . COLONOSCOPY  11/2007, 11/2012   Dr. Watt Climes  . DILATION AND CURETTAGE OF UTERUS  2006   bleeding (benign)  . ESOPHAGOGASTRODUODENOSCOPY  11/2012   Dr. Watt Climes  . excision of melanoma  1999   R thigh  . NASAL SINUS SURGERY  1995, 2002  . SHOULDER SURGERY  03-2011   Right; bone spur removal (Dr. Theda Sers)  . [place]   artificial disks in TMJ; Dr. Terence Lux    Prior to Admission medications   Medication Sig Start Date End Date Taking? Authorizing Provider  acetaminophen-codeine (TYLENOL #3) 300-30 MG per tablet Take 1 tablet  by mouth daily.     Yes [provider]  ascorbic acid (VITAMIN C) 500 MG tablet Take 1,000 mg by mouth daily.    Yes [provider]  Calcium Carbonate-Vitamin D (CALCIUM 600+D) 600-400 MG-UNIT per tablet Take 1 tablet by mouth daily.    Yes [provider]  cholecalciferol (VITAMIN D) 1000 units tablet Take 1,000 Units by mouth daily.   Yes [provider]  docusate sodium (COLACE) 100 MG capsule Take 200-300 mg by mouth daily.    Yes [provider]  fish oil-omega-3 fatty acids 1000 MG capsule Take 1 g by mouth daily. Reported on 01/20/2016   Yes [provider]  fluticasone (FLONASE) 50 MCG/ACT nasal spray Place 2 sprays into the nose daily as needed for allergies. Reported on 01/20/2016   Yes [provider]  ibuprofen (ADVIL) 200 MG tablet Take 400 mg by mouth every 8 (eight) hours as needed for moderate pain.    Yes [provider]  lisinopril (PRINIVIL,ZESTRIL) 20 MG tablet Take 20 mg by mouth daily.   Yes [provider]  Magnesium 250 MG TABS Take 1 tablet by mouth daily.     Yes [provider]  methocarbamol (ROBAXIN) 500 MG tablet Take 500 mg by mouth every 8 (eight) hours as needed (neck pain). Reported on 01/20/2016   Yes [provider]  Multiple  Vitamin (MULTIVITAMIN) tablet Take 1 tablet by mouth daily.     Yes [provider]  omeprazole-sodium bicarbonate (ZEGERID) 40-1100 MG per capsule Take 1 capsule by mouth at bedtime.  10/16/13  Yes [provider]  sennosides-docusate sodium (SENOKOT-S) 8.6-50 MG tablet Take 1 tablet by mouth daily as needed for constipation.    Yes [provider]  sertraline (ZOLOFT) 100 MG tablet TAKE 1 TABLET (100 MG TOTAL) BY MOUTH DAILY. Patient taking differently: Take 125 mg by mouth daily. TAKE 1 TABLET (100 MG TOTAL) BY MOUTH DAILY. 01/17/15  Yes Rita Ohara, MD    Current Facility-Administered Medications  Medication Dose  Route Frequency Provider Last Rate Last Dose  . 0.9 %  sodium chloride infusion   Intravenous Continuous Sherwood Gambler, MD 125 mL/hr at 08/29/17 1112    . acetaminophen (TYLENOL) tablet 650 mg  650 mg Oral Q6H PRN Oswald Hillock, MD      . ciprofloxacin (CIPRO) IVPB 400 mg  400 mg Intravenous Q12H Georgette Shell, MD   Stopped at 08/30/17 1537  . enoxaparin (LOVENOX) injection 40 mg  40 mg Subcutaneous Q24H Georgette Shell, MD   40 mg at 08/29/17 2123  . HYDROmorphone (DILAUDID) injection 1 mg  1 mg Intravenous Q4H PRN Georgette Shell, MD   1 mg at 08/29/17 1506  . lisinopril (PRINIVIL,ZESTRIL) tablet 20 mg  20 mg Oral Daily Oswald Hillock, MD      . metroNIDAZOLE (FLAGYL) IVPB 500 mg  500 mg Intravenous Q8H Georgette Shell, MD   Stopped at 08/30/17 1410  . ondansetron (ZOFRAN) injection 4 mg  4 mg Intravenous Q6H PRN Georgette Shell, MD      . pantoprazole (PROTONIX) EC tablet 40 mg  40 mg Oral Q1200 Oswald Hillock, MD   40 mg at 08/30/17 1229  . saccharomyces boulardii (FLORASTOR) capsule 250 mg  250 mg Oral BID Georgette Shell, MD   250 mg at 08/30/17 6063  . sertraline (ZOLOFT) tablet 125 mg  125 mg Oral QHS Darrick Meigs, Marge Duncans, MD      . simethicone [facility]) chewable tablet 80 mg  80 mg Oral QID PRN Oswald Hillock, MD   80 mg at 08/30/17 1234    Allergies as of 08/29/2017 - Review Complete 08/29/2017  Allergen Reaction Noted  . Darvocet [propoxyphene n-acetaminophen] Other (See Comments)   . Etodolac Other (See Comments) 10/19/2011  . Septra [bactrim] Hives     Family History  Problem Relation Age of Onset  . Hypertension Mother   . Allergies Mother   . Hyperlipidemia Mother   . Arthritis Mother   . Melanoma Mother   . Cancer Mother        uterine cancer and melanoma  . Heart disease Mother        atrial fibrillation  . Glaucoma Father   . Depression Father        (per daughter, never treated)  . Cancer Father        prostate cancer, diagnosed in 23's   . Colon polyps Father   . Glaucoma Sister   . Cancer Maternal Aunt        breast cancer in 40's  . Heart disease Maternal Aunt   . Heart disease Maternal Uncle   . Heart disease Maternal Uncle   . Stroke Maternal Grandmother   . Stroke Maternal Grandfather   . Diabetes Neg Hx     Social History   Social  History  . Marital status: Single    Spouse name: N/A  . Number of children: 0  . Years of education: N/A   Occupational History  . actuarial analyst [employer]   Social History Main Topics  . Smoking status: Never Smoker  . Smokeless tobacco: Never Used  . Alcohol use Yes     Comment: 1 glass of wine per year.  . Drug use: No  . Sexual activity: Not Currently   Other Topics Concern  . Not on file   Social History Narrative   Lives alone.  Cat passed away 2016-01-01 (age 58); has 2 kittens now (12/2016).  Works for [employer]    Review of Systems: As per HPI, all others negative  Physical Exam: Vital signs in last 24 hours: Temp:  [98.4 F (36.9 C)-98.5 F (36.9 C)] 98.5 F (36.9 C) (09/10 1406) Pulse Rate:  [69-82] 69 (09/10 1406) Resp:  [15-16] 16 (09/10 1406) BP: (118-145)/(48-64) 145/64 (09/10 1406) SpO2:  [94 %-99 %] 98 % (09/10 1406) Last BM Date: 08/28/17 General:   Alert, overweight, Well-developed, well-nourished, pleasant and cooperative in NAD Head:  Normocephalic and atraumatic. Eyes:  Sclera clear, no icterus.   Conjunctiva pink. Ears:  Normal auditory acuity. Nose:  No deformity, discharge,  or lesions. Mouth:  No deformity or lesions.  Oropharynx pink & moist. Neck:  Supple; no masses or thyromegaly. Abdomen:  Soft, protuberant, mild generalized tenderness Msk:  Symmetrical without gross deformities. Normal posture. Pulses:  Normal pulses noted. Extremities:  Without clubbing or edema. Neurologic:  Alert and  oriented x4; diffusely weak, otherwise grossly normal neurologically. Skin:  Intact without significant lesions or  rashes. Psych:  Alert and cooperative. Normal mood and affect.   Lab Results:  Recent Labs  08/29/17 0753 08/30/17 0510  WBC 10.9* 7.3  HGB 13.5 12.0  HCT 39.0 35.4*  PLT 246 203   BMET  Recent Labs  08/29/17 0753 08/30/17 0510  NA 130* 139  K 4.1 3.7  CL 99* 106  CO2 22 24  GLUCOSE 140* 109*  BUN 13 7  CREATININE 0.66 0.62  CALCIUM 8.8* 8.3*   LFT  Recent Labs  08/30/17 0510  PROT 5.9*  ALBUMIN 3.5  AST 31  ALT 41  ALKPHOS 77  BILITOT 0.6   PT/INR No results for input(s): LABPROT, INR in the last 72 hours.  Studies/Results: Ct Abdomen Pelvis W Contrast  Result Date: 08/29/2017 CLINICAL DATA:  Acute generalized abdominal pain. EXAM: CT ABDOMEN AND PELVIS WITH CONTRAST TECHNIQUE: Multidetector CT imaging of the abdomen and pelvis was performed using the standard protocol following bolus administration of intravenous contrast. CONTRAST:  156mL ISOVUE-300 IOPAMIDOL (ISOVUE-300) INJECTION 61% COMPARISON:  None. FINDINGS: Lower chest: No acute abnormality. Hepatobiliary: No focal liver abnormality is seen. No gallstones, gallbladder wall thickening, or biliary dilatation. Pancreas: Unremarkable. No pancreatic ductal dilatation or surrounding inflammatory changes. Spleen: Normal in size without focal abnormality. Adrenals/Urinary Tract: Adrenal glands are unremarkable. Kidneys are normal, without renal calculi, focal lesion, or hydronephrosis. Bladder is unremarkable. Stomach/Bowel: The stomach and appendix appear normal. There is no evidence of bowel obstruction. Wall thickening and surrounding inflammatory changes are seen involving the descending and sigmoid colon consistent with infectious or inflammatory colitis. Vascular/Lymphatic: No significant vascular findings are present. No enlarged abdominal or pelvic lymph nodes. Reproductive: Uterus and bilateral adnexa are unremarkable. Other: No abdominal wall hernia or abnormality. No abdominopelvic ascites. Musculoskeletal:  No acute or significant osseous findings. IMPRESSION: Wall thickening and  surrounding inflammatory changes are seen involving the descending and sigmoid colon consistent with infectious or inflammatory colitis. Electronically Signed   By: Marijo Conception, M.D.   On: 08/29/2017 09:04   Impression:  1.  Lower abdominal pain, improving. 2.  Blood in stool, improving. 3.  Abnormal CT scan. 4.  Overall constellation of findings favor infectious or ischemic colitis.  Blood in stool not typical of C. Diff colitis. Given abrupt symptom onset, doubt inflammatory bowel disease.  Plan:  1.  Clear liquid diet. 2.  Awaiting stool studies. 3.  Follow CBC. 4.  If patient continues to improve, pending clinical course, and if CBC stable, consider advance diet to soft and maybe consider discharge home tomorrow afternoon. 5.  Will follow.    LOS: 1 day   Charmel Pronovost M  08/30/2017, 4:42 PM  Cell [phone] If no answer or after 5 PM call [phone]

## 2017-08-31 LAB — CBC
HCT: 34.7 % — ABNORMAL LOW (ref 36.0–46.0)
Hemoglobin: 11.9 g/dL — ABNORMAL LOW (ref 12.0–15.0)
MCH: 32.3 pg (ref 26.0–34.0)
MCHC: 34.3 g/dL (ref 30.0–36.0)
MCV: 94.3 fL (ref 78.0–100.0)
Platelets: 205 10*3/uL (ref 150–400)
RBC: 3.68 MIL/uL — ABNORMAL LOW (ref 3.87–5.11)
RDW: 12.7 % (ref 11.5–15.5)
WBC: 5.6 10*3/uL (ref 4.0–10.5)

## 2017-08-31 LAB — COMPREHENSIVE METABOLIC PANEL
ALBUMIN: 3.2 g/dL — AB (ref 3.5–5.0)
ALK PHOS: 84 U/L (ref 38–126)
ALT: 36 U/L (ref 14–54)
AST: 26 U/L (ref 15–41)
Anion gap: 10 (ref 5–15)
BILIRUBIN TOTAL: 0.5 mg/dL (ref 0.3–1.2)
BUN: 5 mg/dL — AB (ref 6–20)
CO2: 21 mmol/L — ABNORMAL LOW (ref 22–32)
Calcium: 8.5 mg/dL — ABNORMAL LOW (ref 8.9–10.3)
Chloride: 107 mmol/L (ref 101–111)
Creatinine, Ser: 0.62 mg/dL (ref 0.44–1.00)
GFR calc Af Amer: >60 mL/min (ref >60)
GLUCOSE: 111 mg/dL — AB (ref 65–99)
POTASSIUM: 3.6 mmol/L (ref 3.5–5.1)
Sodium: 138 mmol/L (ref 135–145)
TOTAL PROTEIN: 6 g/dL — AB (ref 6.5–8.1)

## 2017-08-31 MED ORDER — METRONIDAZOLE 500 MG PO TABS: 500.0000 mg | ORAL_TABLET | Freq: Three times a day (TID) | ORAL | 0 refills | Status: AC

## 2017-08-31 MED ORDER — CIPROFLOXACIN HCL 500 MG PO TABS: 500.0000 mg | ORAL_TABLET | Freq: Two times a day (BID) | ORAL | 0 refills | Status: AC

## 2017-08-31 NOTE — Progress Notes (Signed)
[facility] Gastroenterology Progress Note  DEJANE SCHEIBE 61 y.o. Dec 06, 1956   Subjective: Feels ok. Denies abdominal pain. No BM since Saturday. Tolerating liquid diet. Hungry.  Objective: Vital signs: Vitals:   08/30/17 2155 08/31/17 0854  BP: (!) 149/67 (!) 163/78  Pulse: 63   Resp: 16   Temp: 98.2 F (36.8 C)   SpO2: 97%     Physical Exam: Gen: alert, no acute distress  HEENT: anicteric sclera CV: RRR Chest: CTA B Abd: minimal RLQ and LLQ tenderness with minimal guarding, soft, nondistended, +BS Ext: no edema  Lab Results:  Recent Labs  08/30/17 0510 08/31/17 0502  NA 139 138  K 3.7 3.6  CL 106 107  CO2 24 21*  GLUCOSE 109* 111*  BUN 7 5*  CREATININE 0.62 0.62  CALCIUM 8.3* 8.5*    Recent Labs  08/30/17 0510 08/31/17 0502  AST 31 26  ALT 41 36  ALKPHOS 77 84  BILITOT 0.6 0.5  PROT 5.9* 6.0*  ALBUMIN 3.5 3.2*    Recent Labs  08/30/17 0510 08/31/17 0502  WBC 7.3 5.6  HGB 12.0 11.9*  HCT 35.4* 34.7*  MCV 94.4 94.3  PLT 203 205      Assessment/Plan: Left-sided colitis - clinically improving. Advance diet. Ok to go home today from GI standpoint if tolerates soft diet for lunch. Needs to f/u with Dr. Watt Climes in 3-4 weeks. Will sign off. Call if questions.   Dargan C. 08/31/2017, 10:50 AM  Pager [phone]  AFTER 5 PM or on weekends please call [phone] ID: Darin Engels, female   DOB: 10/21/1956, 61 y.o.   MRN: [id number]

## 2017-08-31 NOTE — Progress Notes (Signed)
Patient when getting ready to leave after discharge she went to bathroom and had a BM. Patient called me and asked me to look at as she was concerned and wanted me to notify Doctor. I told her with her condition we could expect some but did page Dr. Darrick Meigs as he requested that I call Dr. Michail Sermon GI and if discharge needed to be cancelled to let him know. Dr. Michail Sermon called back and was not concerned as there were no clots or frank red blood. He stated " she will see some blood in her stool for the next 10 days or so from the colitis and to make sure she doesn't take any NSAID's.: Patient was made aware of the reasons to see the GI doctors sooner or call problems with any issues of clots or frank red blood and to stop Nsaids, and that some blood is to be expected for the next 10 days.

## 2017-08-31 NOTE — Discharge Summary (Signed)
Physician Discharge Summary  Amanda Baldwin IRJ:[id number] DOB: [birth year] DOA: 08/29/2017  PCP: Rita Ohara, MD  Admit date: 08/29/2017 Discharge date: 08/31/2017  Time spent: *35 minutes  Recommendations for Outpatient Follow-up:  1. Follow-up PCP in 2 weeks 2. Follow up with GI in 3 weeks   Discharge Diagnoses:  Active Problems:   Colitis   Discharge Condition: Stable  Diet recommendation: Regular diet  Filed Weights   08/29/17 0739  Weight: 87.5 kg (193 lb)    History of present illness:  61 y.o.femalewith medical history significant for hemorrhoids, history of pseudomembranous colitis in 1993 after taking long course of antibiotics, admitted with bloody diarrhea, abdominal pain, and bright red bleeding per rectum. Patient reports that this started last night and the pain has been getting worse  Hospital Course:  1. Acute colitis- resolved, inflammatory versus infectious, improved on IV antibiotics. Patient was started on  ciprofloxacin and Flagyl. Currently, she has improved. Started on clear liquid diet and advance to soft diet. GI has signed off and follow patient in office in 3 weeks. Will discharge patient home on ciprofloxacin for 5 more days. 2. Hypertension- blood pressure stable, continue lisinopril 3. Hyponatremia-sodium was 130 yesterday, likely from poor by mouth intake, improved to 139  4. Depression-continue Zoloft.  Procedures:  None  Consultations:  None  Discharge Exam: Vitals:   08/30/17 2155 08/31/17 0854  BP: (!) 149/67 (!) 163/78  Pulse: 63   Resp: 16   Temp: 98.2 F (36.8 C)   SpO2: 97%     Physical Exam: Eyes: No icterus, extraocular muscles intact  Mouth: Oral mucosa is moist, no lesions on palate,  Neck: Supple, no deformities, masses, or tenderness Lungs: Normal respiratory effort, bilateral clear to auscultation, no crackles or wheezes.  Heart: Regular rate and rhythm, S1 and S2 normal, no murmurs, rubs auscultated Abdomen: BS  normoactive,soft,nondistended, mild tenderness in left lower quadrant, no rigidity or guarding. Extremities: No pretibial edema, no erythema, no cyanosis, no clubbing Neuro : Alert and oriented to time, place and person, No focal deficits   Discharge Instructions   Discharge Instructions    Diet - low sodium heart healthy    Complete by:  As directed    Increase activity slowly    Complete by:  As directed      Current Discharge Medication List    START taking these medications   Details  ciprofloxacin (CIPRO) 500 MG tablet Take 1 tablet (500 mg total) by mouth 2 (two) times daily. Qty: 10 tablet, Refills: 0    metroNIDAZOLE (FLAGYL) 500 MG tablet Take 1 tablet (500 mg total) by mouth 3 (three) times daily. Qty: 15 tablet, Refills: 0      CONTINUE these medications which have NOT CHANGED   Details  acetaminophen-codeine (TYLENOL #3) 300-30 MG per tablet Take 1 tablet by mouth daily.      ascorbic acid (VITAMIN C) 500 MG tablet Take 1,000 mg by mouth daily.     Calcium Carbonate-Vitamin D (CALCIUM 600+D) 600-400 MG-UNIT per tablet Take 1 tablet by mouth daily.     cholecalciferol (VITAMIN D) 1000 units tablet Take 1,000 Units by mouth daily.    docusate sodium (COLACE) 100 MG capsule Take 200-300 mg by mouth daily.     fish oil-omega-3 fatty acids 1000 MG capsule Take 1 g by mouth daily. Reported on 01/20/2016    fluticasone (FLONASE) 50 MCG/ACT nasal spray Place 2 sprays into the nose daily as needed for allergies. Reported on 01/20/2016  lisinopril (PRINIVIL,ZESTRIL) 20 MG tablet Take 20 mg by mouth daily.    Magnesium 250 MG TABS Take 1 tablet by mouth daily.      methocarbamol (ROBAXIN) 500 MG tablet Take 500 mg by mouth every 8 (eight) hours as needed (neck pain). Reported on 01/20/2016    Multiple Vitamin (MULTIVITAMIN) tablet Take 1 tablet by mouth daily.      omeprazole-sodium bicarbonate (ZEGERID) 40-1100 MG per capsule Take 1 capsule by mouth at bedtime.      sennosides-docusate sodium (SENOKOT-S) 8.6-50 MG tablet Take 1 tablet by mouth daily as needed for constipation.     sertraline (ZOLOFT) 100 MG tablet TAKE 1 TABLET (100 MG TOTAL) BY MOUTH DAILY. Qty: 90 tablet, Refills: 3   Associated Diagnoses: Depression, major, in remission ([place])      STOP taking these medications     ibuprofen (ADVIL) 200 MG tablet        Allergies  Allergen Reactions  . Darvocet [Propoxyphene N-Acetaminophen] Other (See Comments)    Faint, blood pressure goes sky high.  . Etodolac Other (See Comments)    Red face, arms-looked like bad sunburn.  Sarina Ill [Bactrim] [place], Eve, MD Follow up in 2 week(s).   Specialty:  Family Medicine Contact information: [place] [address] [phone]        Clarene Essex, MD Follow up in 3 week(s).   Specialty:  Gastroenterology Contact information: [address] [place] [address] [phone]            The results of significant diagnostics from this hospitalization (including imaging, microbiology, ancillary and laboratory) are listed below for reference.    Significant Diagnostic Studies: Ct Abdomen Pelvis W Contrast  Result Date: 08/29/2017 CLINICAL DATA:  Acute generalized abdominal pain. EXAM: CT ABDOMEN AND PELVIS WITH CONTRAST TECHNIQUE: Multidetector CT imaging of the abdomen and pelvis was performed using the standard protocol following bolus administration of intravenous contrast. CONTRAST:  159mL ISOVUE-300 IOPAMIDOL (ISOVUE-300) INJECTION 61% COMPARISON:  None. FINDINGS: Lower chest: No acute abnormality. Hepatobiliary: No focal liver abnormality is seen. No gallstones, gallbladder wall thickening, or biliary dilatation. Pancreas: Unremarkable. No pancreatic ductal dilatation or surrounding inflammatory changes. Spleen: Normal in size without focal abnormality. Adrenals/Urinary Tract: Adrenal glands are unremarkable.  Kidneys are normal, without renal calculi, focal lesion, or hydronephrosis. Bladder is unremarkable. Stomach/Bowel: The stomach and appendix appear normal. There is no evidence of bowel obstruction. Wall thickening and surrounding inflammatory changes are seen involving the descending and sigmoid colon consistent with infectious or inflammatory colitis. Vascular/Lymphatic: No significant vascular findings are present. No enlarged abdominal or pelvic lymph nodes. Reproductive: Uterus and bilateral adnexa are unremarkable. Other: No abdominal wall hernia or abnormality. No abdominopelvic ascites. Musculoskeletal: No acute or significant osseous findings. IMPRESSION: Wall thickening and surrounding inflammatory changes are seen involving the descending and sigmoid colon consistent with infectious or inflammatory colitis. Electronically Signed   By: Marijo Conception, M.D.   On: 08/29/2017 09:04    Microbiology: No results found for this or any previous visit (from the past 240 hour(s)).   Labs: Basic Metabolic Panel:  Recent Labs Lab 08/29/17 0753 08/30/17 0510 08/31/17 0502  NA 130* 139 138  K 4.1 3.7 3.6  CL 99* 106 107  CO2 22 24 21*  GLUCOSE 140* 109* 111*  BUN 13 7 5*  CREATININE 0.66 0.62 0.62  CALCIUM 8.8* 8.3* 8.5*   Liver Function Tests:  Recent Labs Lab 08/29/17 0753 08/30/17 0510 08/31/17 0502  AST 61* 31 26  ALT 65* 41 36  ALKPHOS 98 77 84  BILITOT 0.7 0.6 0.5  PROT 7.1 5.9* 6.0*  ALBUMIN 4.0 3.5 3.2*    Recent Labs Lab 08/29/17 0753  LIPASE 35   No results for input(s): AMMONIA in the last 168 hours. CBC:  Recent Labs Lab 08/29/17 0753 08/30/17 0510 08/31/17 0502  WBC 10.9* 7.3 5.6  HGB 13.5 12.0 11.9*  HCT 39.0 35.4* 34.7*  MCV 92.6 94.4 94.3  PLT 246 203 205       Signed:  Kathelene Rumberger S MD.  [facility] 08/31/2017, 11:27 AM

## 2017-09-01 ENCOUNTER — Telehealth: Payer: Self-pay | Admitting: Family Medicine

## 2017-09-01 NOTE — Telephone Encounter (Signed)
Called pt back to relay message. She states that she is "on the mend" about 75% better and tolerating the medications, "so far so good". She states that the nurse, [employer],  where she works is monitoring her bp. She states she will call if anything changes.

## 2017-09-01 NOTE — Telephone Encounter (Signed)
Called pt to check on her and see how she was feeling after her recent hospital stay. She stated that she didn't understand why she needed to follow up with [place]. I told her I would seek your advise and let her know. Please advise pt if she needs to see you or not. Pt can be reached at [phone].

## 2017-09-01 NOTE — Telephone Encounter (Signed)
The doctors in the hospital recommended f/u with me in 2 weeks, and with GI in 3 weeks.  If she is feeling 100%, tolerating her medications, no recurrent problems once she completes them, and BP's are okay (they were a little high in hospital), then she doesn't necessarily need to come.  Encourage her to call back to schedule f/u if needed.

## 2017-09-08 DIAGNOSIS — Z136 Encounter for screening for cardiovascular disorders: Secondary | ICD-10-CM | POA: Diagnosis not present

## 2017-09-09 DIAGNOSIS — K559 Vascular disorder of intestine, unspecified: Secondary | ICD-10-CM | POA: Diagnosis not present

## 2017-09-16 DIAGNOSIS — M542 Cervicalgia: Secondary | ICD-10-CM | POA: Diagnosis not present

## 2017-09-16 DIAGNOSIS — M9902 Segmental and somatic dysfunction of thoracic region: Secondary | ICD-10-CM | POA: Diagnosis not present

## 2017-09-16 DIAGNOSIS — M9904 Segmental and somatic dysfunction of sacral region: Secondary | ICD-10-CM | POA: Diagnosis not present

## 2017-09-16 DIAGNOSIS — M546 Pain in thoracic spine: Secondary | ICD-10-CM | POA: Diagnosis not present

## 2017-09-17 DIAGNOSIS — Z136 Encounter for screening for cardiovascular disorders: Secondary | ICD-10-CM | POA: Diagnosis not present

## 2017-09-22 DIAGNOSIS — R197 Diarrhea, unspecified: Secondary | ICD-10-CM | POA: Diagnosis not present

## 2017-09-23 ENCOUNTER — Encounter (HOSPITAL_COMMUNITY): Payer: Self-pay | Admitting: Emergency Medicine

## 2017-09-23 ENCOUNTER — Emergency Department (HOSPITAL_COMMUNITY)
Admission: EM | Admit: 2017-09-23 | Discharge: 2017-09-23 | Disposition: A | Discharge status: Home / Self Care | Payer: BLUE CROSS/BLUE SHIELD | Attending: Emergency Medicine | Admitting: Emergency Medicine

## 2017-09-23 ENCOUNTER — Emergency Department (HOSPITAL_COMMUNITY): Payer: BLUE CROSS/BLUE SHIELD

## 2017-09-23 DIAGNOSIS — I10 Essential (primary) hypertension: Secondary | ICD-10-CM | POA: Insufficient documentation

## 2017-09-23 DIAGNOSIS — Z79899 Other long term (current) drug therapy: Secondary | ICD-10-CM | POA: Diagnosis not present

## 2017-09-23 DIAGNOSIS — K529 Noninfective gastroenteritis and colitis, unspecified: Secondary | ICD-10-CM | POA: Insufficient documentation

## 2017-09-23 DIAGNOSIS — Z85828 Personal history of other malignant neoplasm of skin: Secondary | ICD-10-CM | POA: Diagnosis not present

## 2017-09-23 DIAGNOSIS — R109 Unspecified abdominal pain: Secondary | ICD-10-CM | POA: Diagnosis not present

## 2017-09-23 LAB — CBC
HEMATOCRIT: 36.9 % (ref 36.0–46.0)
HEMOGLOBIN: 12.8 g/dL (ref 12.0–15.0)
MCH: 32.2 pg (ref 26.0–34.0)
MCHC: 34.7 g/dL (ref 30.0–36.0)
MCV: 92.7 fL (ref 78.0–100.0)
Platelets: 254 10*3/uL (ref 150–400)
RBC: 3.98 MIL/uL (ref 3.87–5.11)
RDW: 12.4 % (ref 11.5–15.5)
WBC: 4.8 10*3/uL (ref 4.0–10.5)

## 2017-09-23 LAB — URINALYSIS, ROUTINE W REFLEX MICROSCOPIC
BILIRUBIN URINE: NEGATIVE
Glucose, UA: NEGATIVE mg/dL
HGB URINE DIPSTICK: NEGATIVE
Ketones, ur: 20 mg/dL — AB
Leukocytes, UA: NEGATIVE
NITRITE: NEGATIVE
PH: 6 (ref 5.0–8.0)
Protein, ur: NEGATIVE mg/dL
SPECIFIC GRAVITY, URINE: 1.008 (ref 1.005–1.030)

## 2017-09-23 LAB — COMPREHENSIVE METABOLIC PANEL
ALBUMIN: 4.3 g/dL (ref 3.5–5.0)
ALT: 95 U/L — ABNORMAL HIGH (ref 14–54)
ANION GAP: 13 (ref 5–15)
AST: 74 U/L — ABNORMAL HIGH (ref 15–41)
Alkaline Phosphatase: 134 U/L — ABNORMAL HIGH (ref 38–126)
BUN: 8 mg/dL (ref 6–20)
CHLORIDE: 98 mmol/L — AB (ref 101–111)
CO2: 23 mmol/L (ref 22–32)
Calcium: 9.1 mg/dL (ref 8.9–10.3)
Creatinine, Ser: 0.72 mg/dL (ref 0.44–1.00)
GFR calc Af Amer: >60 mL/min (ref >60)
GFR calc non Af Amer: >60 mL/min (ref >60)
GLUCOSE: 100 mg/dL — AB (ref 65–99)
POTASSIUM: 3.5 mmol/L (ref 3.5–5.1)
SODIUM: 134 mmol/L — AB (ref 135–145)
Total Bilirubin: 0.3 mg/dL (ref 0.3–1.2)
Total Protein: 7.6 g/dL (ref 6.5–8.1)

## 2017-09-23 LAB — LIPASE, BLOOD: Lipase: 38 U/L (ref 11–51)

## 2017-09-23 MED ORDER — SODIUM CHLORIDE 0.9 % IV BOLUS (SEPSIS)
1000.0000 mL | Freq: Once | INTRAVENOUS | Status: AC
  Administered 2017-09-23: 1000 mL via INTRAVENOUS

## 2017-09-23 MED ORDER — IOPAMIDOL (ISOVUE-300) INJECTION 61%
INTRAVENOUS | Status: AC
  Administered 2017-09-23: 100 mL via INTRAVENOUS
  Filled 2017-09-23: qty 100

## 2017-09-23 MED ORDER — CIPROFLOXACIN HCL 500 MG PO TABS: 500.0000 mg | ORAL_TABLET | Freq: Two times a day (BID) | ORAL | 0 refills | Status: DC

## 2017-09-23 MED ORDER — FENTANYL CITRATE (PF) 100 MCG/2ML IJ SOLN
50.0000 ug | Freq: Once | INTRAMUSCULAR | Status: AC
  Administered 2017-09-23: 50 ug via INTRAVENOUS
  Filled 2017-09-23: qty 2

## 2017-09-23 MED ORDER — METRONIDAZOLE 500 MG PO TABS: 500.0000 mg | ORAL_TABLET | Freq: Two times a day (BID) | ORAL | 0 refills | Status: DC

## 2017-09-23 MED ORDER — DICYCLOMINE HCL 20 MG PO TABS: 20.0000 mg | ORAL_TABLET | Freq: Two times a day (BID) | ORAL | 0 refills | Status: DC

## 2017-09-23 MED ORDER — ONDANSETRON HCL 4 MG PO TABS: 4.0000 mg | ORAL_TABLET | Freq: Three times a day (TID) | ORAL | 0 refills | Status: DC | PRN

## 2017-09-23 MED ORDER — ONDANSETRON HCL 4 MG/2ML IJ SOLN
4.0000 mg | Freq: Once | INTRAMUSCULAR | Status: AC
  Administered 2017-09-23: 4 mg via INTRAVENOUS
  Filled 2017-09-23: qty 2

## 2017-09-23 NOTE — ED Triage Notes (Signed)
Pt R/L lower abdominal pain, nausea, no vomiting. Pt states she had been dx with colitis and hospitalized last month, pt states she has recurring nausea and feels that she has very scant stool, thin with mucous, pt states she has weight loss, anorexia.

## 2017-09-23 NOTE — ED Notes (Signed)
This RN accidentally "clicked off" CT.

## 2017-09-23 NOTE — ED Notes (Signed)
ED Provider at bedside. 

## 2017-09-23 NOTE — Discharge Instructions (Signed)
Contact a health care provider if: Your symptoms do not go away. You develop new symptoms. Get help right away if: You have a fever that does not go away with treatment. You develop chills. You have extreme weakness, fainting, or dehydration. You have repeated vomiting. You develop severe pain in your abdomen. You pass bloody or tarry stool.

## 2017-09-23 NOTE — ED Provider Notes (Signed)
[facility] DEPT Provider Note   CSN: [id number] Arrival date & time: 09/23/17  1300     History   Chief Complaint Chief Complaint  Patient presents with  . Abdominal Pain  . Nausea    HPI Amanda Baldwin is a 61 y.o. female who presents with cc of abdominal pain and abdominal pain. She has a past medical history of chronic constipation and was diagnosed with colitis about one month ago and hospitalized. She is given Cipro and Flagyl with resolution of her symptoms. During the week of her discharge she began having abdominal pain again. She began having difficulty making bowel movements and she was seen by her gastroenterologist and told that he thought she probably had ischemic colitis secondary to her chronic constipation. The patient was told to increase her dose of MiraLAX. She began having more frequent but scant pencil thin soft stools. She is having frequent rectal urgency to defecate but not producing much stool. This past Monday she discontinued using her MiraLAX. She said that since Monday she been passing large clots of mucus from her rectum. She denies any bloody discharge.  HPI  Past Medical History:  Diagnosis Date  . Allergic rhinitis, cause unspecified    seasonal  . C. difficile colitis 1994   pseudomembranous  . Chronic constipation   . Chronic pain    f/b Dr. Ramos--multilevel cervical DDD, disk bulge L5-S1  . Depression    h/o hospitalization '87  . Elevated liver function tests    normal work-up; some fatty liver on u/s.  mild elevations intermittently  . Hemorrhoids 12/08   internal and external  . Hiatal hernia   . Hyperlipidemia 11/07   good HDL  . Hypertension   . Melanoma of thigh ([place]) 1999   R thigh, level 1 (Dr. Allyson Sabal)  . Plantar fasciitis, bilateral    L worse than R; Dr. Paulla Dolly (in past)  . Reflux   . Vitamin D deficiency 2010    Patient Active Problem List   Diagnosis Date Noted  . Colitis 08/29/2017  . Depression, major, in  remission ([place]) 01/17/2015  . Other malaise and fatigue 11/08/2013  . Plantar fasciitis 11/08/2013  . Depressive disorder, not elsewhere classified 11/08/2013  . Abnormal ECG 06/21/2012  . GERD (gastroesophageal reflux disease) 06/01/2012  . Essential hypertension, benign 10/20/2011    Past Surgical History:  Procedure Laterality Date  . [place]   Right breast--removal of benign tumor and benign cyst  . COLONOSCOPY  11/2007, 11/2012   Dr. Watt Climes  . DILATION AND CURETTAGE OF UTERUS  2006   bleeding (benign)  . ESOPHAGOGASTRODUODENOSCOPY  11/2012   Dr. Watt Climes  . excision of melanoma  1999   R thigh  . NASAL SINUS SURGERY  1995, 2002  . SHOULDER SURGERY  03-2011   Right; bone spur removal (Dr. Theda Sers)  . [place]   artificial disks in TMJ; Dr. Terence Lux    OB History    Gravida Para Term Preterm AB Living   0 0 0 0 0 0   SAB TAB Ectopic Multiple Live Births   0 0 0 0         Home Medications    Prior to Admission medications   Medication Sig Start Date End Date Taking? Authorizing Provider  acetaminophen-codeine (TYLENOL #3) 300-30 MG per tablet Take 1 tablet by mouth daily.      [provider]  ascorbic acid (VITAMIN C) 500  MG tablet Take 1,000 mg by mouth daily.     [provider]  Calcium Carbonate-Vitamin D (CALCIUM 600+D) 600-400 MG-UNIT per tablet Take 1 tablet by mouth daily.     [provider]  cholecalciferol (VITAMIN D) 1000 units tablet Take 1,000 Units by mouth daily.    [provider]  docusate sodium (COLACE) 100 MG capsule Take 200-300 mg by mouth daily.     [provider]  fish oil-omega-3 fatty acids 1000 MG capsule Take 1 g by mouth daily. Reported on 01/20/2016    [provider]  fluticasone (FLONASE) 50 MCG/ACT nasal spray Place 2 sprays into the nose daily as needed for allergies. Reported on 01/20/2016    [provider]  lisinopril  (PRINIVIL,ZESTRIL) 20 MG tablet Take 20 mg by mouth daily.    [provider]  Magnesium 250 MG TABS Take 1 tablet by mouth daily.      [provider]  methocarbamol (ROBAXIN) 500 MG tablet Take 500 mg by mouth every 8 (eight) hours as needed (neck pain). Reported on 01/20/2016    [provider]  Multiple Vitamin (MULTIVITAMIN) tablet Take 1 tablet by mouth daily.      [provider]  omeprazole-sodium bicarbonate (ZEGERID) 40-1100 MG per capsule Take 1 capsule by mouth at bedtime.  10/16/13   [provider]  sennosides-docusate sodium (SENOKOT-S) 8.6-50 MG tablet Take 1 tablet by mouth daily as needed for constipation.     [provider]  sertraline (ZOLOFT) 100 MG tablet TAKE 1 TABLET (100 MG TOTAL) BY MOUTH DAILY. Patient taking differently: Take 125 mg by mouth daily. TAKE 1 TABLET (100 MG TOTAL) BY MOUTH DAILY. 01/17/15   Rita Ohara, MD    Family History Family History  Problem Relation Age of Onset  . Hypertension Mother   . Allergies Mother   . Hyperlipidemia Mother   . Arthritis Mother   . Melanoma Mother   . Cancer Mother        uterine cancer and melanoma  . Heart disease Mother        atrial fibrillation  . Glaucoma Father   . Depression Father        (per daughter, never treated)  . Cancer Father        prostate cancer, diagnosed in 56's  . Colon polyps Father   . Glaucoma Sister   . Cancer Maternal Aunt        breast cancer in 40's  . Heart disease Maternal Aunt   . Heart disease Maternal Uncle   . Heart disease Maternal Uncle   . Stroke Maternal Grandmother   . Stroke Maternal Grandfather   . Diabetes Neg Hx     Social History Social History  Substance Use Topics  . Smoking status: Never Smoker  . Smokeless tobacco: Never Used  . Alcohol use Yes     Comment: 1 glass of wine per year.     Allergies   Darvocet [propoxyphene n-acetaminophen]; Etodolac; and Septra [bactrim]   Review of  Systems Review of Systems  Ten systems reviewed and are negative for acute change, except as noted in the HPI.   Physical Exam Updated Vital Signs BP (!) 159/100 (BP Location: Left Arm)   Pulse 86   Temp 98.8 F (37.1 C) (Oral)   Resp 20   SpO2 97%   Physical Exam  Constitutional: She is oriented to person, place, and time. She appears well-developed and well-nourished. No distress.  HENT:  Head: Normocephalic and atraumatic.  Eyes: Pupils are equal, round, and reactive to light. Conjunctivae and EOM are normal. No scleral icterus.  Neck: Normal range of motion.  Cardiovascular: Normal rate, regular rhythm and normal heart sounds.  Exam reveals no gallop and no friction rub.   No murmur heard. Pulmonary/Chest: Effort normal and breath sounds normal. No respiratory distress.  Abdominal: Soft. Bowel sounds are normal. She exhibits no distension and no mass. There is no tenderness. There is no guarding.  Neurological: She is alert and oriented to person, place, and time.  Skin: Skin is warm and dry. She is not diaphoretic.  Psychiatric: Her behavior is normal.  Nursing note and vitals reviewed.    ED Treatments / Results  Labs (all labs ordered are listed, but only abnormal results are displayed) Labs Reviewed  COMPREHENSIVE METABOLIC PANEL - Abnormal; Notable for the following:       Result Value   Sodium 134 (*)    Chloride 98 (*)    Glucose, Bld 100 (*)    AST 74 (*)    ALT 95 (*)    Alkaline Phosphatase 134 (*)    All other components within normal limits  LIPASE, BLOOD  CBC  URINALYSIS, ROUTINE W REFLEX MICROSCOPIC    EKG  EKG Interpretation None       Radiology No results found.  Procedures Procedures (including critical care time)  Medications Ordered in ED Medications  sodium chloride 0.9 % bolus 1,000 mL (not administered)     Initial Impression / Assessment and Plan / ED Course  I have reviewed the triage vital signs and the nursing  notes.  Pertinent labs & imaging results that were available during my care of the patient were reviewed by me and considered in my medical decision making (see chart for details).     Patient with interval improvement in her colitis however does persistent. No signs of inflammatory changes in the surrounding tissues. Afebrile without elevated white count. I discussed the case with Dr. Tyrone Nine. Given her presumption of ischemic colitis by her gastroenterologist there is no indication for use of antibiotics. However I have given the patient a prescription to hold and have asked that she communicate with her physician tomorrow. I have also sent and in box message to her gastroenterologist. Patient will be treated symptomatically with nausea medication, anti-spasmodics and bowel rest.  She appears well and has a benign abdominal exam without focal tenderness no active vomiting. She appears safe for discharge at this time  Final Clinical Impressions(s) / ED Diagnoses   Final diagnoses:  Colitis    New Prescriptions New Prescriptions   No medications on file     Margarita Mail, PA-C 09/24/17 [place], [place], DO 09/24/17 [id number]

## 2017-09-24 DIAGNOSIS — R1084 Generalized abdominal pain: Secondary | ICD-10-CM | POA: Diagnosis not present

## 2017-09-24 DIAGNOSIS — R945 Abnormal results of liver function studies: Secondary | ICD-10-CM | POA: Diagnosis not present

## 2017-09-24 DIAGNOSIS — A0472 Enterocolitis due to Clostridium difficile, not specified as recurrent: Secondary | ICD-10-CM | POA: Diagnosis not present

## 2017-09-24 DIAGNOSIS — R110 Nausea: Secondary | ICD-10-CM | POA: Diagnosis not present

## 2017-09-29 DIAGNOSIS — R140 Abdominal distension (gaseous): Secondary | ICD-10-CM | POA: Diagnosis not present

## 2017-09-29 DIAGNOSIS — R945 Abnormal results of liver function studies: Secondary | ICD-10-CM | POA: Diagnosis not present

## 2017-09-29 DIAGNOSIS — A0472 Enterocolitis due to Clostridium difficile, not specified as recurrent: Secondary | ICD-10-CM | POA: Diagnosis not present

## 2017-09-29 DIAGNOSIS — K5900 Constipation, unspecified: Secondary | ICD-10-CM | POA: Diagnosis not present

## 2017-10-13 DIAGNOSIS — K559 Vascular disorder of intestine, unspecified: Secondary | ICD-10-CM | POA: Diagnosis not present

## 2017-10-13 DIAGNOSIS — R945 Abnormal results of liver function studies: Secondary | ICD-10-CM | POA: Diagnosis not present

## 2017-10-13 DIAGNOSIS — A0472 Enterocolitis due to Clostridium difficile, not specified as recurrent: Secondary | ICD-10-CM | POA: Diagnosis not present

## 2017-10-13 DIAGNOSIS — Z23 Encounter for immunization: Secondary | ICD-10-CM | POA: Diagnosis not present

## 2017-10-14 DIAGNOSIS — Z0000 Encounter for general adult medical examination without abnormal findings: Secondary | ICD-10-CM | POA: Diagnosis not present

## 2017-10-29 DIAGNOSIS — M5136 Other intervertebral disc degeneration, lumbar region: Secondary | ICD-10-CM | POA: Diagnosis not present

## 2017-11-08 DIAGNOSIS — R933 Abnormal findings on diagnostic imaging of other parts of digestive tract: Secondary | ICD-10-CM | POA: Diagnosis not present

## 2017-11-08 DIAGNOSIS — R1084 Generalized abdominal pain: Secondary | ICD-10-CM | POA: Diagnosis not present

## 2017-11-08 DIAGNOSIS — Z8371 Family history of colonic polyps: Secondary | ICD-10-CM | POA: Diagnosis not present

## 2017-11-08 DIAGNOSIS — K635 Polyp of colon: Secondary | ICD-10-CM | POA: Diagnosis not present

## 2017-11-08 DIAGNOSIS — K5730 Diverticulosis of large intestine without perforation or abscess without bleeding: Secondary | ICD-10-CM | POA: Diagnosis not present

## 2017-11-08 LAB — HM COLONOSCOPY

## 2017-11-16 DIAGNOSIS — K635 Polyp of colon: Secondary | ICD-10-CM | POA: Diagnosis not present

## 2017-11-17 DIAGNOSIS — F418 Other specified anxiety disorders: Secondary | ICD-10-CM | POA: Diagnosis not present

## 2017-11-17 DIAGNOSIS — I10 Essential (primary) hypertension: Secondary | ICD-10-CM | POA: Diagnosis not present

## 2017-12-27 ENCOUNTER — Encounter: Payer: Self-pay | Admitting: *Deleted

## 2017-12-29 ENCOUNTER — Encounter: Payer: Self-pay | Admitting: Family Medicine

## 2018-01-10 DIAGNOSIS — H04121 Dry eye syndrome of right lacrimal gland: Secondary | ICD-10-CM | POA: Diagnosis not present

## 2018-01-23 NOTE — Progress Notes (Signed)
Chief Complaint  Patient presents with  . Annual Exam    fasting annual exam, no pap-sees Dr.Mody and is UTD. Had eye exam with Dr. Donato Heinz in Dec. Wanted to to talk to you about her hands shaking in the mornings over the past year.     Amanda Baldwin is a 62 y.o. female who presents for a complete physical.  She has the following concerns:  Most mornings (not all), when putting on her makeup she notices a little tremor. It is usually just first thing in the morning, then resolves completely.  Doesn't notice any difference since her blood pressure medications were changed (no longer on beta blocker).  Isn't positional (not resting on anything when it occurs). Has noticed this for years, is symmetric, and not progressing.  She brings in copies of her labs done through [facility] [place] at work in October, done just after her illness:  c-met notable for fasting glucose 100, alk phos 143 (normal up to 117), LDH 262 (normal up to 226), AST/ALT 54/88, GGT 325 (normal to 60). Lipids: TC 253, TG 97, HDL 69, LDL 165, chol/HDL ratio 3.7 TSH 0.995, and normal T4 and T3 uptake Normal CBC.  HTN--Blood pressures haven't been checked regularly, just recalls "they have been good". [name] (NP at work) changed from metoprolol 32m to Lisinopril 270m  This was over 6 months ago.  She has just a slight cough (likely from postnasal drainage, chronic). Denies side effects.  She was noted to have asymmetry of kidneys on 01/2017 abdominal [place] She was sent for Duplex which didn't show significant blockage (1-59% bilaterally).  Dr. EaDonnetta Hutchingad said to consider CT angiogram (if kidney function remains normal) only if she has persistent difficult to control hypertension (which she hasn't demonstrated). Denies headaches, dizziness, chest pain, palpitations.  She had echo back in 2013, when she was having significant fatigue.  Notable for moderate diastolic dysfunction, EF 6017-71% Energy is about the same as it has  been, "I'm doing okay", same as in past.  Not severe as it has been in the past (2013). No known snoring. Doesn't fall asleep while driving.   Depression:  Doing well on Zoloft, moods good.  She is back to 10067mnd is doing well. (Depression was a little worse at her physical last year--She had increased her zoloft to 125m59m2.5 tablets of 50mg22me month prior to her visit).   Hyperlipidemia--previously took meds, but has been off lipid-lowering meds for years, and numbers have been okay.  Has been following low cholesterol diet. As stated above, [facility] labs showed much higher cholesterol in October. She is fasting for recheck today.  Red meat 2-3x/week, some cheese, skim, eats 2 hard boiled egg whites daily. Lab Results  Component Value Date   CHOL 200 01/20/2016   HDL 61 01/20/2016   LDLCALC 116 01/20/2016   TRIG 115 01/20/2016   CHOLHDL 3.3 01/20/2016   Elevated LFT's--abdominal US [place] 01/2017 showing "question mild fatty infiltration of the liver."  LFT's have been monitored, last check in our system was in hospital/ER visit in October (see below). Had it at [facility] that month (see above) and though Dr. MagodWatt Climesovember (no results available). Had check at work in 03/2017 and AST was 34, ALT 49 (had improved from prior check here in January).  Lab Results  Component Value Date   ALT 95 (H) 09/23/2017   AST 74 (H) 09/23/2017   ALKPHOS 134 (  H) 09/23/2017   BILITOT 0.3 09/23/2017   She was hospitalized in September with acute colitis, inflammatory versus infectious, treated with Cipro and Flagyl.  During the week of her discharge she began having abdominal pain again, trouble with her bowels; her gastroenterologist thought she probably had ischemic colitis secondary to her chronic constipation, and increased her Miralax.  She went back to ER in October, where repeat CT showed interval improvement in her colitis however still persisted. No signs of inflammatory changes in  the surrounding tissues. Afebrile without elevated white count, so given her presumption of ischemic colitis by her gastroenterologist, they did not treat with antibiotics. They did give her a prescription to hold, along with anti-emetics and anti-spasmodics, and she was to discuss with her GI in follow-up. She ultimately was diagnosed with C.diff (through [facility]), and treated with Vancomycin for 10 days. Bowels are much better, just slight constipation.  She uses Miralax 1/3 dose 3-4 times/week.  She had colonoscopy by Dr. Watt Climes in November 2018, which showed diverticulosis and diminutive polyp--path showed polypoid colonic mucosa with lymphoid aggregate. He recommended 5 year f/u.  Vitamin D deficiency--level was low at 26 in 12/2015, 30 last year. She has been taking 2800 IU total dose from her vitamins.  Allergies: She has been using Flonase daily, but is still suffering from a lot of postnasal drainage, that sometimes interferes with her sleep.  Cats sleep with her.  She was told 15 years ago she has a slight cat allergy.   Daily tension headaches at work, takes 2 advil daily.  Pain is at temples, also has some neck pain. Using Robaxin more often as well. Sees Dr. Nelva Bush for pain management.  She gets Zoloft, BP meds and  Flonase for free from NP [name] at her job.  Immunization History  Administered Date(s) Administered  . Influenza Split 09/30/2011, 10/28/2012, 09/29/2013, 09/20/2014  . Influenza-Unspecified 09/22/2015  . Tdap 10/19/2011  . Zoster 12/09/2012   gets flu shots at work. Last Pap smear: UTD 05/2016, through GYN (yearly visits) Last mammogram: 03/2017 at [place] colonoscopy: 10/2017, Dr. Watt Climes.  rec f/u 5 years. Last DEXA: through GYN, reportedly normal (done twice, at [facility]). Dentist: twice yearly  Ophtho: yearly (December) Exercise: Only recently back to exercising.  Going to the gym with a coworker, only about once a week (only has gone a few times).  Plan is  3-4 days/week.  Past Medical History:  Diagnosis Date  . Allergic rhinitis, cause unspecified    seasonal  . C. difficile colitis 09/24/2017   treated with oral vancomycin per [facility].   Marland Kitchen Chronic constipation   . Chronic pain    f/b Dr. Ramos--multilevel cervical DDD, disk bulge L5-S1  . Colitis 08/29/2017   treated with Cipro and Flagyl. ( [facility])   . Depression    h/o hospitalization '87  . Elevated liver function tests    normal work-up; some fatty liver on u/s.  mild elevations intermittently  . Hemorrhoids 12/08   internal and external  . Hiatal hernia   . Hyperlipidemia 11/07   good HDL  . Hypertension   . Melanoma of thigh ([place]) 1999   R thigh, level 1 (Dr. Allyson Sabal)  . Plantar fasciitis, bilateral    L worse than R; Dr. Paulla Dolly (in past)  . Reflux   . Vitamin D deficiency 2010    Past Surgical History:  Procedure Laterality Date  . [place]   Right breast--removal of  benign tumor and benign cyst  . COLONOSCOPY  12/19/07, 12/18/2012   Dr. Watt Climes  . DILATION AND CURETTAGE OF UTERUS  2006   bleeding (benign)  . ESOPHAGOGASTRODUODENOSCOPY  2012-12-18   Dr. Watt Climes  . excision of melanoma  1999   R thigh  . NASAL SINUS SURGERY  1995, 2002  . SHOULDER SURGERY  03-2011   Right; bone spur removal (Dr. Theda Sers)  . [place]   artificial disks in TMJ; Dr. Terence Lux    Social History   Socioeconomic History  . Marital status: Single    Spouse name: Not on file  . Number of children: 0  . Years of education: Not on file  . Highest education level: Not on file  Social Needs  . Financial resource strain: Not on file  . Food insecurity - worry: Not on file  . Food insecurity - inability: Not on file  . Transportation needs - medical: Not on file  . Transportation needs - non-medical: Not on file  Occupational History  . Occupation: Marketing executive: [employer]  Tobacco Use  .  Smoking status: Never Smoker  . Smokeless tobacco: Never Used  Substance and Sexual Activity  . Alcohol use: Yes    Comment: 1 glass of wine per year.  . Drug use: No  . Sexual activity: Not Currently  Other Topics Concern  . Not on file  Social History Narrative   Lives alone.  Cat passed away 12/19/15 (age 20); has 2 kittens now (12/2016).  Works for [employer]    Family History  Problem Relation Age of Onset  . Hypertension Mother   . Allergies Mother   . Hyperlipidemia Mother   . Arthritis Mother   . Melanoma Mother   . Cancer Mother        uterine cancer and melanoma  . Heart disease Mother        atrial fibrillation  . Glaucoma Father   . Depression Father        (per daughter, never treated)  . Cancer Father        prostate cancer, diagnosed in 46's  . Colon polyps Father   . Glaucoma Sister   . Cancer Maternal Aunt        breast cancer in 40's  . Heart disease Maternal Aunt   . Heart disease Maternal Uncle   . Heart disease Maternal Uncle   . Stroke Maternal Grandmother   . Stroke Maternal Grandfather   . Diabetes Neg Hx     Outpatient Encounter Medications as of 01/24/2018  Medication Sig Note  . acetaminophen-codeine (TYLENOL #3) 300-30 MG per tablet Take 1 tablet by mouth daily.   11/08/2013: Takes it once daily, rx'd by Dr. Nelva Bush, for chronic back pain/arthritis  . Calcium Carbonate-Vitamin D (CALCIUM 600+D) 600-400 MG-UNIT per tablet Take 1 tablet by mouth daily.    . cholecalciferol (VITAMIN D) 1000 units tablet Take 1,000 Units by mouth daily.   . fluticasone (FLONASE) 50 MCG/ACT nasal spray Place 2 sprays into the nose daily as needed for allergies. Reported on 01/20/2016   . ibuprofen (ADVIL,MOTRIN) 200 MG tablet Take 200-800 mg by mouth every 6 (six) hours as needed for headache or mild pain. 01/24/2018: Takes 2 almost every day  . lisinopril (PRINIVIL,ZESTRIL) 20 MG tablet Take 20 mg by mouth daily.   . Magnesium 250 MG TABS Take 1 tablet by mouth daily.     Marland Kitchen  methocarbamol (ROBAXIN) 500 MG tablet Take 500 mg by mouth every 8 (eight) hours as needed (neck pain). Reported on 01/20/2016 01/24/2018: 1 daily at work, rarely takes 2  . Multiple Vitamin (MULTIVITAMIN) tablet Take 1 tablet by mouth daily.     Marland Kitchen omeprazole-sodium bicarbonate (ZEGERID) 40-1100 MG per capsule Take 1 capsule by mouth at bedtime.  11/08/2013: rx'd by Dr. Watt Climes  . polyethylene glycol (MIRALAX / GLYCOLAX) packet Take 17 g by mouth daily as needed for mild constipation or moderate constipation.   . sertraline (ZOLOFT) 100 MG tablet TAKE 1 TABLET (100 MG TOTAL) BY MOUTH DAILY. (Patient taking differently: Take 100 mg by mouth daily. TAKE 1 TABLET (100 MG TOTAL) BY MOUTH DAILY.) 01/24/2018: Taking 110m daily  . XIIDRA 5 % SOLN Place 1 drop into both eyes 2 (two) times daily.   .Marland Kitchenascorbic acid (VITAMIN C) 500 MG tablet Take 1,000 mg by mouth daily.    . [DISCONTINUED] ciprofloxacin (CIPRO) 500 MG tablet Take 1 tablet (500 mg total) by mouth 2 (two) times daily. One po bid x 7 days   . [DISCONTINUED] dicyclomine (BENTYL) 20 MG tablet Take 1 tablet (20 mg total) by mouth 2 (two) times daily.   . [DISCONTINUED] docusate sodium (COLACE) 100 MG capsule Take 100 mg by mouth daily as needed for mild constipation.    . [DISCONTINUED] metroNIDAZOLE (FLAGYL) 500 MG tablet Take 1 tablet (500 mg total) by mouth 2 (two) times daily. One po bid x 7 days   . [DISCONTINUED] ondansetron (ZOFRAN) 4 MG tablet Take 1 tablet (4 mg total) by mouth every 8 (eight) hours as needed for nausea or vomiting.    No facility-administered encounter medications on file as of 01/24/2018.     Allergies  Allergen Reactions  . Darvocet [Propoxyphene N-Acetaminophen] Other (See Comments)    Faint, blood pressure goes sky high.  . Etodolac Other (See Comments)    Red face, arms-looked like bad sunburn.  .Sarina Ill[Bactrim] Hives     ROS: The patient denies anorexia, fever, vision changes, decreased hearing, ear pain, sore  throat, breast concerns, chest pain, palpitations, syncope, dyspnea on exertion, cough, swelling, nausea, vomiting, diarrhea, melena, hematochezia, indigestion/heartburn, hematuria, incontinence, dysuria, vaginal bleeding, discharge, odor or itch, genital lesions, numbness, tingling, weakness, suspicious skin lesions, anxiety, abnormal bleeding/bruising, or enlarged lymph nodes.  She sees Dr. LAllyson Sabalyearly for skin checks. + mild fatigue (unchanged). Slight hand tremor in mornings, (not all) Depression is stable, doing well. Weight is down about 10# (last weights were prior to her GI illness) +tension headaches at work, per HPI Chronic back pain/arthritis, uses T#3 every morning (Dr. RNelva Bush PND with slight cough.   PHYSICAL EXAM:  BP 122/62   Pulse 72   Ht _0  (1.676 m)   Wt 182 lb 3.2 oz (82.6 kg)   BMI 29.41 kg/m   Wt Readings from Last 3 Encounters:  01/24/18 182 lb 3.2 oz (82.6 kg)  08/29/17 193 lb (87.5 kg)  03/17/17 191 lb 6.4 oz (86.8 kg)    Declines changing into gown--sees GYN and has upcoming derm appt  General Appearance:  Alert, cooperative, no distress, appears stated age   Head:  Normocephalic, without obvious abnormality, atraumatic   Eyes:  PERRL, conjunctiva/corneas clear, EOM's intact, fundi benign   Ears:  Normal TM's and external ear canals   Nose:  Nares normal, mucosa is moderately edematous, clear mucus R>L; no sinus tenderness   Throat:  Lips, mucosa, and tongue normal; teeth  and gums normal   Neck:  Supple, no lymphadenopathy; thyroid: no enlargement/tenderness/nodules; no carotid bruit or JVD   Back:  Spine nontender, no curvature, ROM normal, no CVA tenderness   Lungs:  Clear to auscultation bilaterally without wheezes, rales or ronchi; respirations unlabored   Chest Wall:  No tenderness or deformity   Heart:  Regular rate and rhythm, S1 and S2 normal, no murmur, rub or gallop   Breast Exam:  Deferred to  GYN   Abdomen:  Soft, non-tender, nondistended, normoactive bowel sounds, no masses,  no hepatosplenomegaly   Genitalia:  Deferred to GYN      Extremities:  No clubbing, cyanosis or edema;   Pulses:  2+ and symmetric all extremities   Skin:  Skin color, texture, turgor normal, no rashes or lesions.  Lymph nodes:  Cervical, supraclavicular, and axillary nodes normal   Neurologic:  CNII-XII intact, normal strength, sensation and gait; reflexes 2+ and symmetric throughout    Psych:  Normal mood, affect, hygiene and grooming   ASSESSMENT/PLAN:  Annual physical exam - Plan: POCT Urinalysis DIP (Proadvantage Device)  Essential hypertension, benign - well controlled on lisinopril - Plan: Comprehensive metabolic panel  Elevated LFTs  Depression, major, in remission ([place])  Vitamin D deficiency - Plan: VITAMIN D 25 Hydroxy (Vit-D Deficiency, Fractures)  Pure hypercholesterolemia - counseled re: lowfat, low cholesterol diet - Plan: Lipid panel  Gastroesophageal reflux disease, esophagitis presence not specified - controlled, treated per Dr. Watt Climes  Allergic rhinitis, unspecified seasonality, unspecified trigger - not adequately controlled with flonase--reviewed proper use. Add antihistamine   c-met, lipids, D  Discussed monthly self breast exams and yearly mammograms; at least 30 minutes of aerobic activity at least 5 days/week, weight-bearing exercise at least 2x/week; proper sunscreen use reviewed; healthy diet, including goals of calcium and vitamin D intake and alcohol recommendations (less than or equal to 1 drink/day) reviewed; regular seatbelt use; changing batteries in smoke detectors. Immunization recommendations discussed, UTD. Continue yearly flu shots.  Shingrix recommended, discussed. Colonoscopy recommendations reviewed--UTD, due again 10/2022  NSAID risks reviewed.  Consider taking a daily antihistamine such as Claritin,  Allegra, or Zyrtec, as needed for your allergies and postnasal drainage, in addition to the daily Flonase.

## 2018-01-24 ENCOUNTER — Encounter: Payer: Self-pay | Admitting: Family Medicine

## 2018-01-24 ENCOUNTER — Ambulatory Visit: Payer: BLUE CROSS/BLUE SHIELD | Admitting: Family Medicine

## 2018-01-24 VITALS — BP 122/62 | HR 72 | Ht 66.0 in | Wt 182.2 lb

## 2018-01-24 DIAGNOSIS — K219 Gastro-esophageal reflux disease without esophagitis: Secondary | ICD-10-CM | POA: Diagnosis not present

## 2018-01-24 DIAGNOSIS — F325 Major depressive disorder, single episode, in full remission: Secondary | ICD-10-CM | POA: Diagnosis not present

## 2018-01-24 DIAGNOSIS — J309 Allergic rhinitis, unspecified: Secondary | ICD-10-CM

## 2018-01-24 DIAGNOSIS — E7800 Pure hypercholesterolemia, unspecified: Secondary | ICD-10-CM

## 2018-01-24 DIAGNOSIS — R945 Abnormal results of liver function studies: Secondary | ICD-10-CM

## 2018-01-24 DIAGNOSIS — Z0000 Encounter for general adult medical examination without abnormal findings: Secondary | ICD-10-CM

## 2018-01-24 DIAGNOSIS — E559 Vitamin D deficiency, unspecified: Secondary | ICD-10-CM | POA: Diagnosis not present

## 2018-01-24 DIAGNOSIS — I10 Essential (primary) hypertension: Secondary | ICD-10-CM | POA: Diagnosis not present

## 2018-01-24 DIAGNOSIS — R7989 Other specified abnormal findings of blood chemistry: Secondary | ICD-10-CM

## 2018-01-24 LAB — POCT URINALYSIS DIP (PROADVANTAGE DEVICE)
BILIRUBIN UA: NEGATIVE
Blood, UA: NEGATIVE
Glucose, UA: NEGATIVE mg/dL
Ketones, POC UA: NEGATIVE mg/dL
LEUKOCYTES UA: NEGATIVE
NITRITE UA: NEGATIVE
PH UA: 6.5 (ref 5.0–8.0)
PROTEIN UA: NEGATIVE mg/dL
Specific Gravity, Urine: 1.015
Urobilinogen, Ur: NEGATIVE

## 2018-01-24 NOTE — Patient Instructions (Addendum)
  HEALTH MAINTENANCE RECOMMENDATIONS:  It is recommended that you get at least 30 minutes of aerobic exercise at least 5 days/week (for weight loss, you may need as much as 60-90 minutes). This can be any activity that gets your heart rate up. This can be divided in 10-15 minute intervals if needed, but try and build up your endurance at least once a week.  Weight bearing exercise is also recommended twice weekly.  Eat a healthy diet with lots of vegetables, fruits and fiber.  "Colorful" foods have a lot of vitamins (ie green vegetables, tomatoes, red peppers, etc).  Limit sweet tea, regular sodas and alcoholic beverages, all of which has a lot of calories and sugar.  Up to 1 alcoholic drink daily may be beneficial for women (unless trying to lose weight, watch sugars).  Drink a lot of water.  Calcium recommendations are 1200-1500 mg daily (1500 mg for postmenopausal women or women without ovaries), and vitamin D 1000 IU daily.  This should be obtained from diet and/or supplements (vitamins), and calcium should not be taken all at once, but in divided doses.  Monthly self breast exams and yearly mammograms for women over the age of 8 is recommended.  Sunscreen of at least SPF 30 should be used on all sun-exposed parts of the skin when outside between the hours of 10 am and 4 pm (not just when at beach or pool, but even with exercise, golf, tennis, and yard work!)  Use a sunscreen that says "broad spectrum" so it covers both UVA and UVB rays, and make sure to reapply every 1-2 hours.  Remember to change the batteries in your smoke detectors when changing your clock times in the spring and fall.  Use your seat belt every time you are in a car, and please drive safely and not be distracted with cell phones and texting while driving.  I recommend getting the new shingles vaccine (Shingrix). You will need to check with your insurance to see if it is covered, and if covered by Medicare Part D, you need to  get from the pharmacy rather than our office.  It is a series of 2 injections, spaced 2 months apart.  Consider taking a daily antihistamine such as Claritin, Allegra, or Zyrtec, as needed for your allergies and postnasal drainage, in addition to the daily Flonase.  Try and avoid daily anti-inflammatories. Try and modify what may be causing the stress headaches (grinding/clenching, seat/work positioning, etc). Consider using tylenol instead, and if there is muscle spasm, robaxin will help some also.

## 2018-01-25 ENCOUNTER — Encounter: Payer: Self-pay | Admitting: Family Medicine

## 2018-01-25 LAB — COMPREHENSIVE METABOLIC PANEL
ALT: 51 IU/L — ABNORMAL HIGH (ref 0–32)
AST: 42 IU/L — AB (ref 0–40)
Albumin/Globulin Ratio: 1.8 (ref 1.2–2.2)
Albumin: 4.8 g/dL (ref 3.6–4.8)
Alkaline Phosphatase: 116 IU/L (ref 39–117)
BUN/Creatinine Ratio: 15 (ref 12–28)
BUN: 12 mg/dL (ref 8–27)
Bilirubin Total: 0.3 mg/dL (ref 0.0–1.2)
CALCIUM: 9.9 mg/dL (ref 8.7–10.3)
CO2: 16 mmol/L — AB (ref 20–29)
CREATININE: 0.82 mg/dL (ref 0.57–1.00)
Chloride: 106 mmol/L (ref 96–106)
GFR calc Af Amer: 88 mL/min/{1.73_m2} (ref >59)
GFR, EST NON AFRICAN AMERICAN: 76 mL/min/{1.73_m2} (ref >59)
GLUCOSE: 106 mg/dL — AB (ref 65–99)
Globulin, Total: 2.6 g/dL (ref 1.5–4.5)
POTASSIUM: 5.1 mmol/L (ref 3.5–5.2)
Sodium: 144 mmol/L (ref 134–144)
Total Protein: 7.4 g/dL (ref 6.0–8.5)

## 2018-01-25 LAB — LIPID PANEL
CHOL/HDL RATIO: 3.3 ratio (ref 0.0–4.4)
Cholesterol, Total: 236 mg/dL — ABNORMAL HIGH (ref 100–199)
HDL: 72 mg/dL (ref >39)
LDL CALC: 141 mg/dL — AB (ref 0–99)
TRIGLYCERIDES: 114 mg/dL (ref 0–149)
VLDL Cholesterol Cal: 23 mg/dL (ref 5–40)

## 2018-01-25 LAB — VITAMIN D 25 HYDROXY (VIT D DEFICIENCY, FRACTURES): VIT D 25 HYDROXY: 35.6 ng/mL (ref 30.0–100.0)

## 2018-01-26 IMAGING — CT CT ABD-PELV W/ CM
2 of 5 series · 16 of 46 positions shown, 18 images · IV contrast (ISOVUE)
Comparison: 08/29/2017

CLINICAL DATA: Bilateral lower abdominal pain with nausea. Colitis
last month.

EXAM:
CT ABDOMEN AND PELVIS WITH CONTRAST
TECHNIQUE: Multidetector CT imaging of the abdomen and pelvis was performed
using the standard protocol following bolus administration of
intravenous contrast.
CONTRAST:  < 100 mL [id number] IOPAMIDOL ([id number]) INJECTION 61%

[Series 2: abd/pel with · axial · 0.79mm/px · z∈[-588,-193]mm · 13 of 91 slices shown, 15 images]
[im 6/91  soft-tissue]
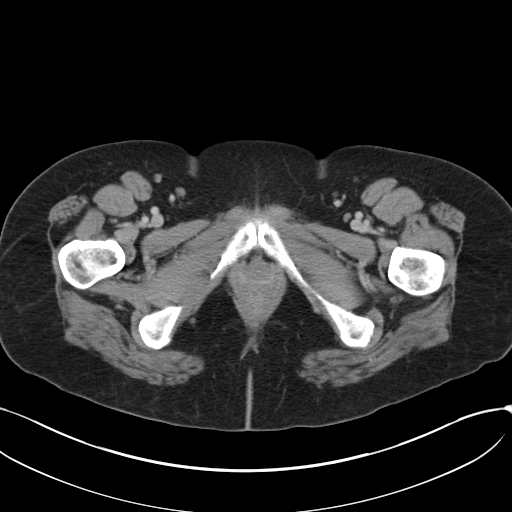
[im 6/91  bone]
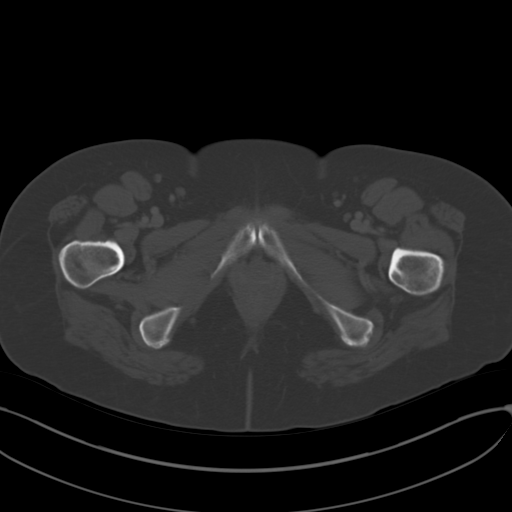
[im 12/91  soft-tissue]
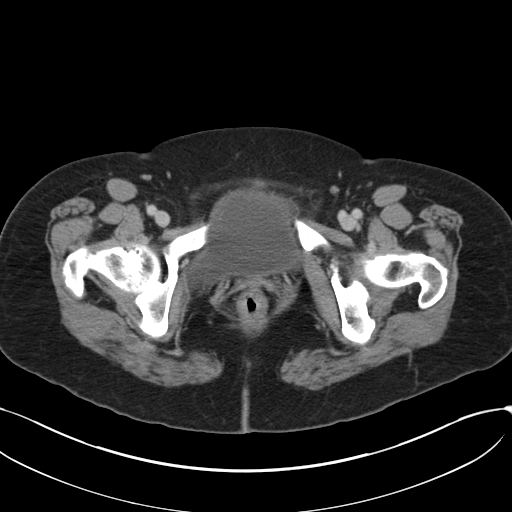
[im 17/91  soft-tissue]
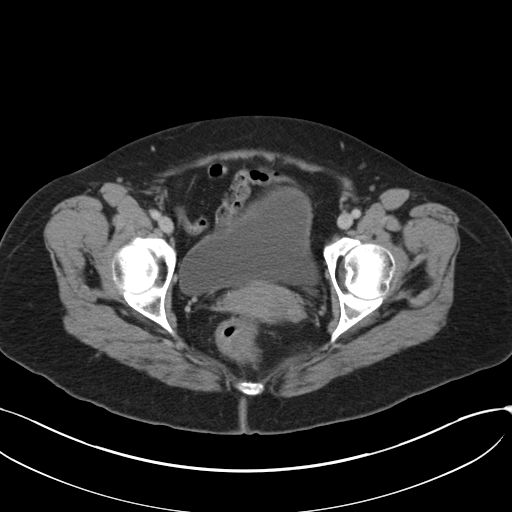
[im 29/91  soft-tissue]
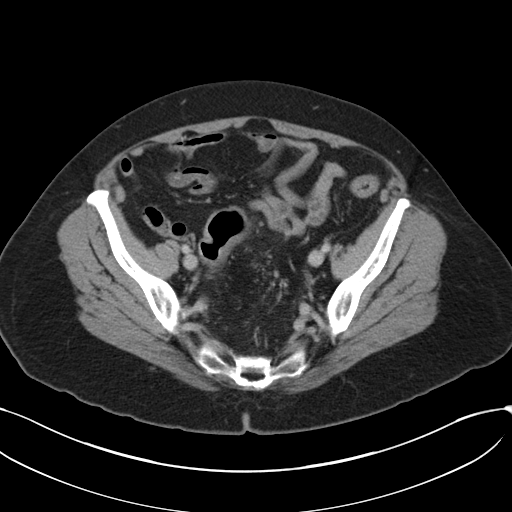
[im 34/91  soft-tissue]
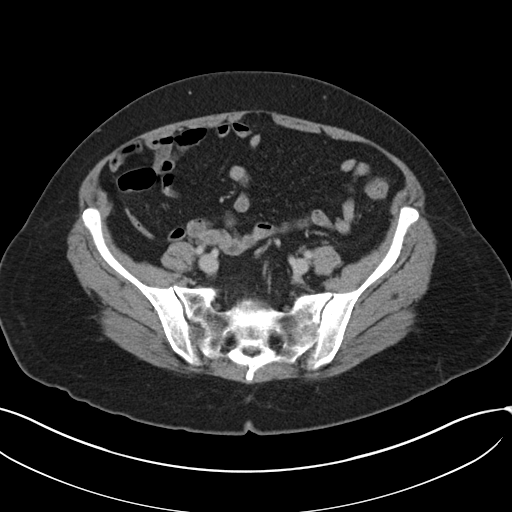
[im 40/91  soft-tissue]
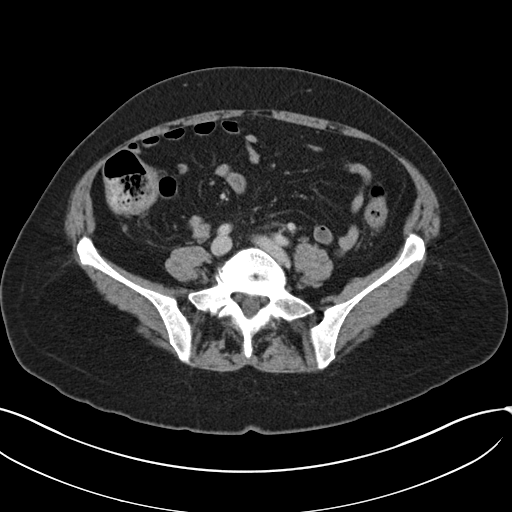
[im 46/91  soft-tissue]
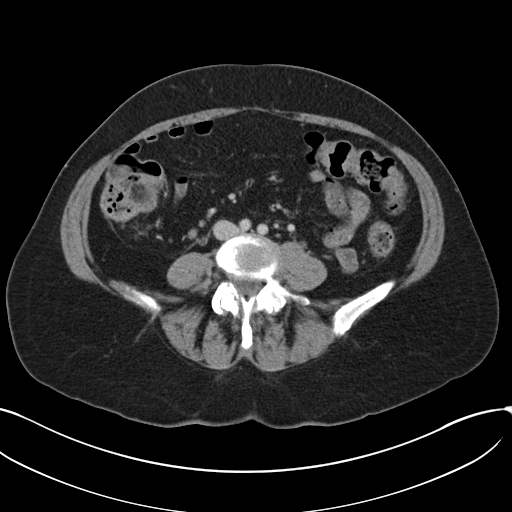
[im 51/91  soft-tissue]
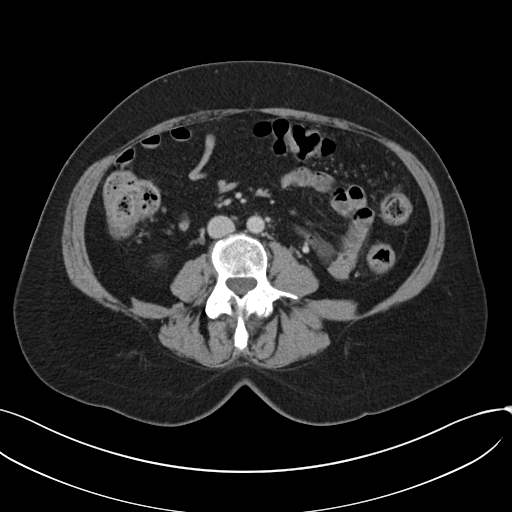
[im 57/91  soft-tissue]
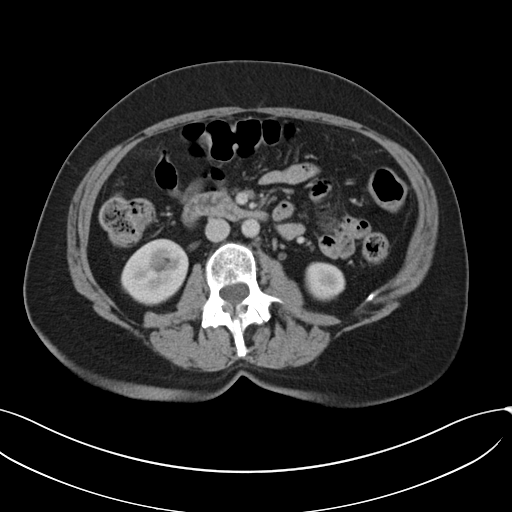
[im 57/91  bone]
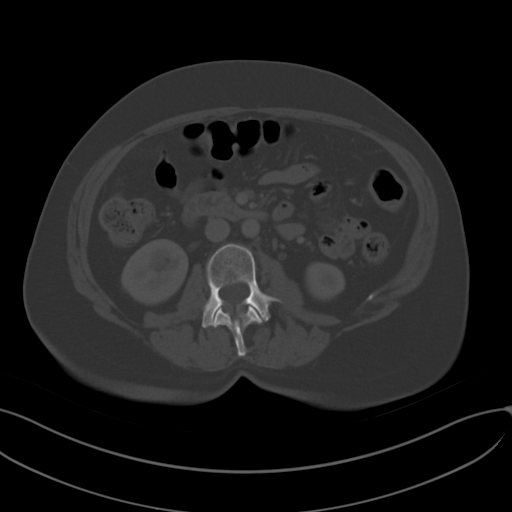
[im 62/91  soft-tissue]
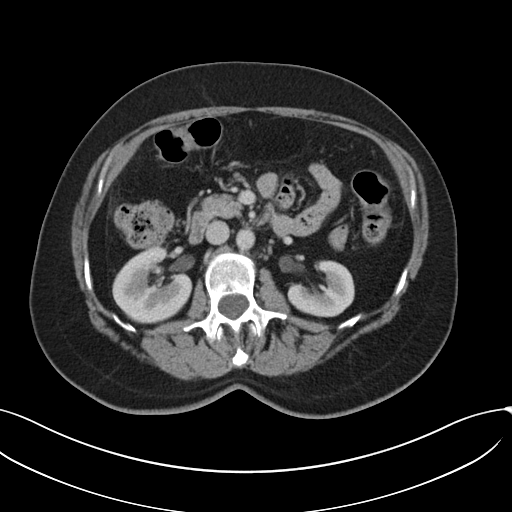
[im 74/91  soft-tissue]
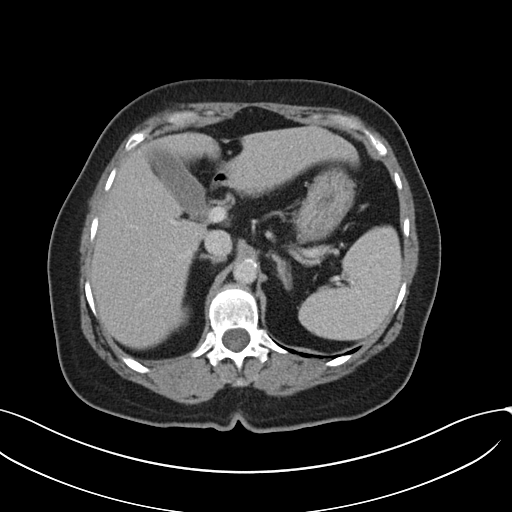
[im 79/91  soft-tissue]
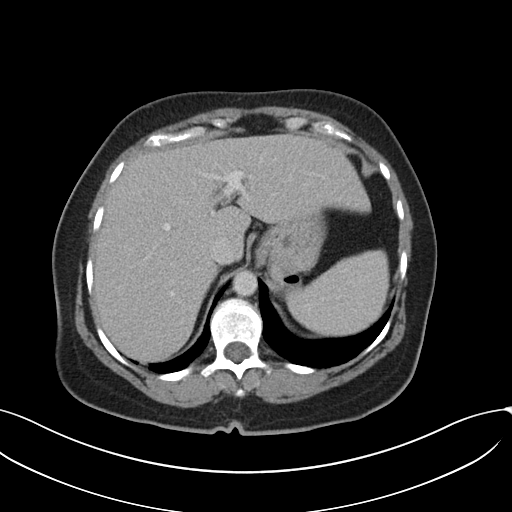
[im 85/91  soft-tissue]
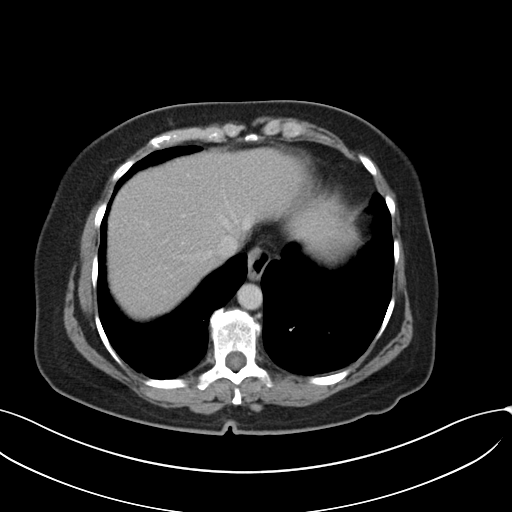

[Series 4: coronal a/|p · coronal · 0.74mm/px · 3 of 188 slices shown]
[im 63/188  soft-tissue]
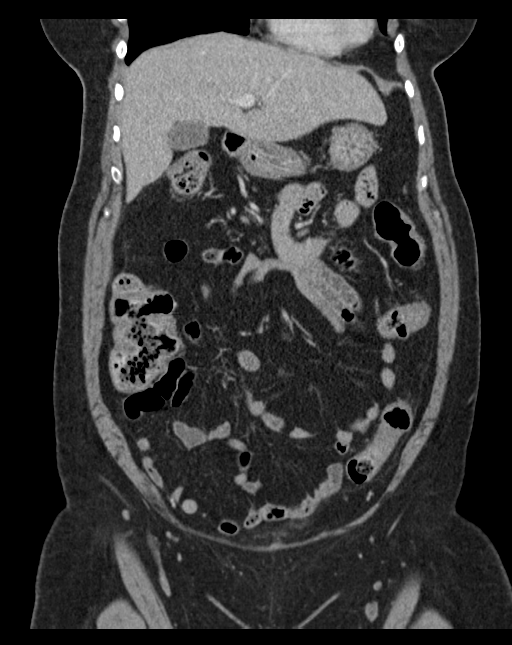
[im 84/188  soft-tissue]
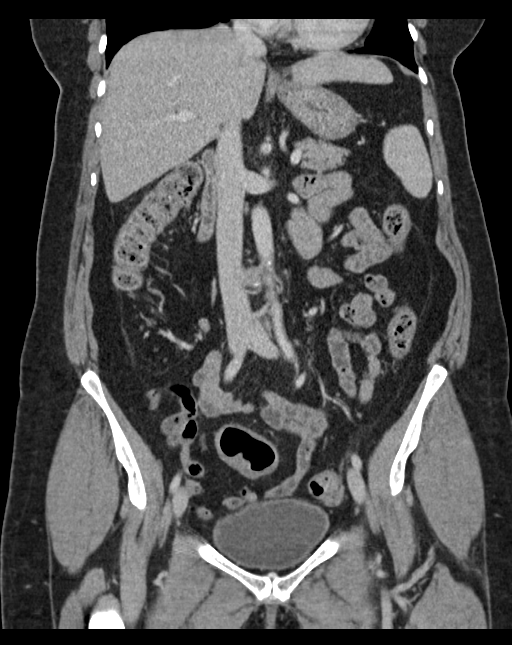
[im 104/188  soft-tissue]
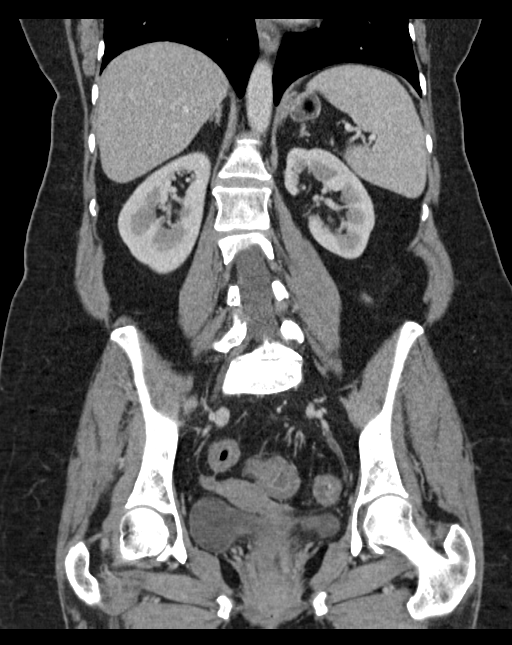

[16 of 46 positions shown; findings below may reference images not displayed]

FINDINGS: Lower chest: Lung bases demonstrate a couple tiny nodules over the
left base with the largest measuring 3 mm unchanged.

Hepatobiliary: Normal.

Pancreas: Normal.

Spleen: Normal.

Adrenals/Urinary Tract: Normal.

Stomach/Bowel: Small posterior diverticulum over the gastric fundus.
Small bowel is normal. Appendix is normal. Mild wall thickening of
the sigmoid colon without significant adjacent inflammatory change
as findings may be due to mild acute colitis. Resolution of the
previous seen acute colitis involving the descending colon.

Vascular/Lymphatic: Within normal.

Reproductive: Within normal.

Other: No free fluid.  No free peritoneal air.

Musculoskeletal: Mild degenerate changes spine with disc disease at
the L5-S1 level.
IMPRESSION: Interval improvement in the previously seen acute colitis of the
descending colon with persistent wall thickening of the sigmoid
colon, although no significant adjacent pericolonic inflammation.
Findings may represent persistent colitis involving the sigmoid
colon.

Small gastric diverticulum.

## 2018-02-07 DIAGNOSIS — M546 Pain in thoracic spine: Secondary | ICD-10-CM | POA: Diagnosis not present

## 2018-02-07 DIAGNOSIS — M542 Cervicalgia: Secondary | ICD-10-CM | POA: Diagnosis not present

## 2018-02-07 DIAGNOSIS — M9902 Segmental and somatic dysfunction of thoracic region: Secondary | ICD-10-CM | POA: Diagnosis not present

## 2018-02-07 DIAGNOSIS — M9901 Segmental and somatic dysfunction of cervical region: Secondary | ICD-10-CM | POA: Diagnosis not present

## 2018-02-14 DIAGNOSIS — I10 Essential (primary) hypertension: Secondary | ICD-10-CM | POA: Diagnosis not present

## 2018-02-14 DIAGNOSIS — F418 Other specified anxiety disorders: Secondary | ICD-10-CM | POA: Diagnosis not present

## 2018-02-14 DIAGNOSIS — J3089 Other allergic rhinitis: Secondary | ICD-10-CM | POA: Diagnosis not present

## 2018-03-28 DIAGNOSIS — D225 Melanocytic nevi of trunk: Secondary | ICD-10-CM | POA: Diagnosis not present

## 2018-03-28 DIAGNOSIS — B351 Tinea unguium: Secondary | ICD-10-CM | POA: Diagnosis not present

## 2018-03-28 DIAGNOSIS — L570 Actinic keratosis: Secondary | ICD-10-CM | POA: Diagnosis not present

## 2018-03-28 DIAGNOSIS — L814 Other melanin hyperpigmentation: Secondary | ICD-10-CM | POA: Diagnosis not present

## 2018-03-28 DIAGNOSIS — D1801 Hemangioma of skin and subcutaneous tissue: Secondary | ICD-10-CM | POA: Diagnosis not present

## 2018-04-21 DIAGNOSIS — R109 Unspecified abdominal pain: Secondary | ICD-10-CM | POA: Diagnosis not present

## 2018-05-03 DIAGNOSIS — M545 Low back pain: Secondary | ICD-10-CM | POA: Diagnosis not present

## 2018-05-03 DIAGNOSIS — M9903 Segmental and somatic dysfunction of lumbar region: Secondary | ICD-10-CM | POA: Diagnosis not present

## 2018-05-03 DIAGNOSIS — M546 Pain in thoracic spine: Secondary | ICD-10-CM | POA: Diagnosis not present

## 2018-05-03 DIAGNOSIS — M9904 Segmental and somatic dysfunction of sacral region: Secondary | ICD-10-CM | POA: Diagnosis not present

## 2018-05-05 DIAGNOSIS — M9904 Segmental and somatic dysfunction of sacral region: Secondary | ICD-10-CM | POA: Diagnosis not present

## 2018-05-05 DIAGNOSIS — M9903 Segmental and somatic dysfunction of lumbar region: Secondary | ICD-10-CM | POA: Diagnosis not present

## 2018-05-05 DIAGNOSIS — M545 Low back pain: Secondary | ICD-10-CM | POA: Diagnosis not present

## 2018-05-05 DIAGNOSIS — M546 Pain in thoracic spine: Secondary | ICD-10-CM | POA: Diagnosis not present

## 2018-05-09 DIAGNOSIS — M9903 Segmental and somatic dysfunction of lumbar region: Secondary | ICD-10-CM | POA: Diagnosis not present

## 2018-05-09 DIAGNOSIS — M9904 Segmental and somatic dysfunction of sacral region: Secondary | ICD-10-CM | POA: Diagnosis not present

## 2018-05-09 DIAGNOSIS — M545 Low back pain: Secondary | ICD-10-CM | POA: Diagnosis not present

## 2018-05-09 DIAGNOSIS — M546 Pain in thoracic spine: Secondary | ICD-10-CM | POA: Diagnosis not present

## 2018-05-12 DIAGNOSIS — M9903 Segmental and somatic dysfunction of lumbar region: Secondary | ICD-10-CM | POA: Diagnosis not present

## 2018-05-12 DIAGNOSIS — M546 Pain in thoracic spine: Secondary | ICD-10-CM | POA: Diagnosis not present

## 2018-05-12 DIAGNOSIS — I10 Essential (primary) hypertension: Secondary | ICD-10-CM | POA: Diagnosis not present

## 2018-05-12 DIAGNOSIS — F418 Other specified anxiety disorders: Secondary | ICD-10-CM | POA: Diagnosis not present

## 2018-05-12 DIAGNOSIS — M545 Low back pain: Secondary | ICD-10-CM | POA: Diagnosis not present

## 2018-05-12 DIAGNOSIS — M9904 Segmental and somatic dysfunction of sacral region: Secondary | ICD-10-CM | POA: Diagnosis not present

## 2018-05-20 DIAGNOSIS — M545 Low back pain: Secondary | ICD-10-CM | POA: Diagnosis not present

## 2018-05-20 DIAGNOSIS — M546 Pain in thoracic spine: Secondary | ICD-10-CM | POA: Diagnosis not present

## 2018-05-20 DIAGNOSIS — M9904 Segmental and somatic dysfunction of sacral region: Secondary | ICD-10-CM | POA: Diagnosis not present

## 2018-05-20 DIAGNOSIS — M9903 Segmental and somatic dysfunction of lumbar region: Secondary | ICD-10-CM | POA: Diagnosis not present

## 2018-05-23 DIAGNOSIS — M545 Low back pain: Secondary | ICD-10-CM | POA: Diagnosis not present

## 2018-07-19 DIAGNOSIS — M9901 Segmental and somatic dysfunction of cervical region: Secondary | ICD-10-CM | POA: Diagnosis not present

## 2018-07-19 DIAGNOSIS — M542 Cervicalgia: Secondary | ICD-10-CM | POA: Diagnosis not present

## 2018-07-19 DIAGNOSIS — M9902 Segmental and somatic dysfunction of thoracic region: Secondary | ICD-10-CM | POA: Diagnosis not present

## 2018-07-19 DIAGNOSIS — M62838 Other muscle spasm: Secondary | ICD-10-CM | POA: Diagnosis not present

## 2018-07-21 DIAGNOSIS — M62838 Other muscle spasm: Secondary | ICD-10-CM | POA: Diagnosis not present

## 2018-07-21 DIAGNOSIS — M542 Cervicalgia: Secondary | ICD-10-CM | POA: Diagnosis not present

## 2018-07-21 DIAGNOSIS — M9901 Segmental and somatic dysfunction of cervical region: Secondary | ICD-10-CM | POA: Diagnosis not present

## 2018-07-21 DIAGNOSIS — M9902 Segmental and somatic dysfunction of thoracic region: Secondary | ICD-10-CM | POA: Diagnosis not present

## 2018-07-28 DIAGNOSIS — M62838 Other muscle spasm: Secondary | ICD-10-CM | POA: Diagnosis not present

## 2018-07-28 DIAGNOSIS — M9902 Segmental and somatic dysfunction of thoracic region: Secondary | ICD-10-CM | POA: Diagnosis not present

## 2018-07-28 DIAGNOSIS — M9901 Segmental and somatic dysfunction of cervical region: Secondary | ICD-10-CM | POA: Diagnosis not present

## 2018-07-28 DIAGNOSIS — M542 Cervicalgia: Secondary | ICD-10-CM | POA: Diagnosis not present

## 2018-08-01 DIAGNOSIS — M542 Cervicalgia: Secondary | ICD-10-CM | POA: Diagnosis not present

## 2018-08-01 DIAGNOSIS — M9902 Segmental and somatic dysfunction of thoracic region: Secondary | ICD-10-CM | POA: Diagnosis not present

## 2018-08-01 DIAGNOSIS — M9901 Segmental and somatic dysfunction of cervical region: Secondary | ICD-10-CM | POA: Diagnosis not present

## 2018-08-01 DIAGNOSIS — M62838 Other muscle spasm: Secondary | ICD-10-CM | POA: Diagnosis not present

## 2018-08-04 DIAGNOSIS — M9901 Segmental and somatic dysfunction of cervical region: Secondary | ICD-10-CM | POA: Diagnosis not present

## 2018-08-04 DIAGNOSIS — M542 Cervicalgia: Secondary | ICD-10-CM | POA: Diagnosis not present

## 2018-08-04 DIAGNOSIS — M62838 Other muscle spasm: Secondary | ICD-10-CM | POA: Diagnosis not present

## 2018-08-04 DIAGNOSIS — M9902 Segmental and somatic dysfunction of thoracic region: Secondary | ICD-10-CM | POA: Diagnosis not present

## 2018-08-08 DIAGNOSIS — G894 Chronic pain syndrome: Secondary | ICD-10-CM | POA: Diagnosis not present

## 2018-08-10 DIAGNOSIS — M9901 Segmental and somatic dysfunction of cervical region: Secondary | ICD-10-CM | POA: Diagnosis not present

## 2018-08-10 DIAGNOSIS — M9902 Segmental and somatic dysfunction of thoracic region: Secondary | ICD-10-CM | POA: Diagnosis not present

## 2018-08-10 DIAGNOSIS — Z760 Encounter for issue of repeat prescription: Secondary | ICD-10-CM | POA: Diagnosis not present

## 2018-08-10 DIAGNOSIS — M542 Cervicalgia: Secondary | ICD-10-CM | POA: Diagnosis not present

## 2018-08-10 DIAGNOSIS — M62838 Other muscle spasm: Secondary | ICD-10-CM | POA: Diagnosis not present

## 2018-08-15 DIAGNOSIS — M542 Cervicalgia: Secondary | ICD-10-CM | POA: Diagnosis not present

## 2018-08-15 DIAGNOSIS — M9901 Segmental and somatic dysfunction of cervical region: Secondary | ICD-10-CM | POA: Diagnosis not present

## 2018-08-15 DIAGNOSIS — M9902 Segmental and somatic dysfunction of thoracic region: Secondary | ICD-10-CM | POA: Diagnosis not present

## 2018-08-15 DIAGNOSIS — Z1231 Encounter for screening mammogram for malignant neoplasm of breast: Secondary | ICD-10-CM | POA: Diagnosis not present

## 2018-08-15 DIAGNOSIS — M62838 Other muscle spasm: Secondary | ICD-10-CM | POA: Diagnosis not present

## 2018-08-15 LAB — HM MAMMOGRAPHY

## 2018-08-16 ENCOUNTER — Encounter: Payer: Self-pay | Admitting: Family Medicine

## 2018-08-18 DIAGNOSIS — M542 Cervicalgia: Secondary | ICD-10-CM | POA: Diagnosis not present

## 2018-08-18 DIAGNOSIS — M9902 Segmental and somatic dysfunction of thoracic region: Secondary | ICD-10-CM | POA: Diagnosis not present

## 2018-08-18 DIAGNOSIS — M62838 Other muscle spasm: Secondary | ICD-10-CM | POA: Diagnosis not present

## 2018-08-18 DIAGNOSIS — M9901 Segmental and somatic dysfunction of cervical region: Secondary | ICD-10-CM | POA: Diagnosis not present

## 2018-08-24 DIAGNOSIS — Z01419 Encounter for gynecological examination (general) (routine) without abnormal findings: Secondary | ICD-10-CM | POA: Diagnosis not present

## 2018-08-24 DIAGNOSIS — Z6828 Body mass index (BMI) 28.0-28.9, adult: Secondary | ICD-10-CM | POA: Diagnosis not present

## 2018-08-25 DIAGNOSIS — M9901 Segmental and somatic dysfunction of cervical region: Secondary | ICD-10-CM | POA: Diagnosis not present

## 2018-08-25 DIAGNOSIS — M62838 Other muscle spasm: Secondary | ICD-10-CM | POA: Diagnosis not present

## 2018-08-25 DIAGNOSIS — M9902 Segmental and somatic dysfunction of thoracic region: Secondary | ICD-10-CM | POA: Diagnosis not present

## 2018-08-25 DIAGNOSIS — M542 Cervicalgia: Secondary | ICD-10-CM | POA: Diagnosis not present

## 2018-09-28 DIAGNOSIS — Z23 Encounter for immunization: Secondary | ICD-10-CM | POA: Diagnosis not present

## 2018-10-14 DIAGNOSIS — Z0000 Encounter for general adult medical examination without abnormal findings: Secondary | ICD-10-CM | POA: Diagnosis not present

## 2018-11-02 DIAGNOSIS — F418 Other specified anxiety disorders: Secondary | ICD-10-CM | POA: Diagnosis not present

## 2018-11-02 DIAGNOSIS — J3089 Other allergic rhinitis: Secondary | ICD-10-CM | POA: Diagnosis not present

## 2018-11-02 DIAGNOSIS — R748 Abnormal levels of other serum enzymes: Secondary | ICD-10-CM | POA: Diagnosis not present

## 2018-11-20 DIAGNOSIS — B029 Zoster without complications: Secondary | ICD-10-CM

## 2018-11-20 HISTORY — DX: Zoster without complications: B02.9

## 2018-12-05 DIAGNOSIS — G894 Chronic pain syndrome: Secondary | ICD-10-CM | POA: Diagnosis not present

## 2018-12-05 DIAGNOSIS — Z79899 Other long term (current) drug therapy: Secondary | ICD-10-CM | POA: Diagnosis not present

## 2018-12-11 ENCOUNTER — Encounter (HOSPITAL_COMMUNITY): Payer: Self-pay

## 2018-12-11 ENCOUNTER — Other Ambulatory Visit: Payer: Self-pay

## 2018-12-11 ENCOUNTER — Ambulatory Visit (HOSPITAL_COMMUNITY)
Admission: EM | Admit: 2018-12-11 | Discharge: 2018-12-11 | Disposition: A | Discharge status: Home / Self Care | Payer: BLUE CROSS/BLUE SHIELD | Attending: Internal Medicine | Admitting: Internal Medicine

## 2018-12-11 DIAGNOSIS — B029 Zoster without complications: Secondary | ICD-10-CM | POA: Diagnosis not present

## 2018-12-11 MED ORDER — GABAPENTIN 100 MG PO CAPS: 100.0000 mg | ORAL_CAPSULE | Freq: Three times a day (TID) | ORAL | 0 refills | Status: DC

## 2018-12-11 MED ORDER — VALACYCLOVIR HCL 1 G PO TABS: 1000.0000 mg | ORAL_TABLET | Freq: Three times a day (TID) | ORAL | 0 refills | Status: DC

## 2018-12-11 NOTE — ED Provider Notes (Signed)
[place]    CSN: [id number] Arrival date & time: 12/11/18  1002     History   Chief Complaint Chief Complaint  Patient presents with  . Rash    HPI Amanda Baldwin is a 62 y.o. female.   62 yo female with past med history of HTN, low back pain and depression presents to urgent care complaining of rash x5 days.  The pain awoke her from sleep.  She thought it was low back pain and called her doctor who prescribed prednisone.  The pain is slightly relieved with Tylenol #3 but she is concerned that she now has a rash. Admits to mild malaise.       Past Medical History:  Diagnosis Date  . Allergic rhinitis, cause unspecified    seasonal  . C. difficile colitis 09/24/2017   treated with oral vancomycin per [facility].   Marland Kitchen Chronic constipation   . Chronic pain    f/b Dr. Ramos--multilevel cervical DDD, disk bulge L5-S1  . Colitis 08/29/2017   treated with Cipro and Flagyl. ( [facility])   . Depression    h/o hospitalization '87  . Elevated liver function tests    normal work-up; some fatty liver on u/s.  mild elevations intermittently  . Hemorrhoids 12/08   internal and external  . Hiatal hernia   . Hyperlipidemia 11/07   good HDL  . Hypertension   . Melanoma of thigh ([place]) 1999   R thigh, level 1 (Dr. Allyson Sabal)  . Plantar fasciitis, bilateral    L worse than R; Dr. Paulla Dolly (in past)  . Reflux   . Vitamin D deficiency 2010    Patient Active Problem List   Diagnosis Date Noted  . Colitis 08/29/2017  . Depression, major, in remission ([place]) 01/17/2015  . Other malaise and fatigue 11/08/2013  . Plantar fasciitis 11/08/2013  . Depressive disorder, not elsewhere classified 11/08/2013  . Abnormal ECG 06/21/2012  . GERD (gastroesophageal reflux disease) 06/01/2012  . Essential hypertension, benign 10/20/2011  . C. difficile colitis 12/21/1992    Past Surgical History:  Procedure Laterality Date  . [place]   Right breast--removal of benign  tumor and benign cyst  . COLONOSCOPY  11/2007, 11/2012   Dr. Watt Climes  . DILATION AND CURETTAGE OF UTERUS  2006   bleeding (benign)  . ESOPHAGOGASTRODUODENOSCOPY  11/2012   Dr. Watt Climes  . excision of melanoma  1999   R thigh  . NASAL SINUS SURGERY  1995, 2002  . SHOULDER SURGERY  03-2011   Right; bone spur removal (Dr. Theda Sers)  . [place]   artificial disks in TMJ; Dr. Terence Lux    OB History    Gravida  0   Para  0   Term  0   Preterm  0   AB  0   Living  0     SAB  0   TAB  0   Ectopic  0   Multiple  0   Live Births               Home Medications    Prior to Admission medications   Medication Sig Start Date End Date Taking? Authorizing Provider  predniSONE (STERAPRED UNI-PAK 21 TAB) 5 MG (21) TBPK tablet Take 5 mg by mouth daily.   Yes [provider]  acetaminophen-codeine (TYLENOL #3) 300-30 MG per tablet Take 1 tablet by mouth daily.      [provider]  ascorbic acid (VITAMIN C) 500 MG tablet Take 1,000 mg by mouth daily.     [provider]  Calcium Carbonate-Vitamin D (CALCIUM 600+D) 600-400 MG-UNIT per tablet Take 1 tablet by mouth daily.     [provider]  cholecalciferol (VITAMIN D) 1000 units tablet Take 1,000 Units by mouth daily.    [provider]  fluticasone (FLONASE) 50 MCG/ACT nasal spray Place 2 sprays into the nose daily as needed for allergies. Reported on 01/20/2016    [provider]  gabapentin (NEURONTIN) 100 MG capsule Take 1 capsule (100 mg total) by mouth 3 (three) times daily for 15 days. May increase to 2 tabs po TID prn if nerve pain not improved 12/11/18 12/26/18  Harrie Foreman, MD  ibuprofen (ADVIL,MOTRIN) 200 MG tablet Take 200-800 mg by mouth every 6 (six) hours as needed for headache or mild pain.    [provider]  lisinopril (PRINIVIL,ZESTRIL) 20 MG tablet Take 20 mg by mouth daily.    [provider]  Magnesium  250 MG TABS Take 1 tablet by mouth daily.      [provider]  methocarbamol (ROBAXIN) 500 MG tablet Take 500 mg by mouth every 8 (eight) hours as needed (neck pain). Reported on 01/20/2016    [provider]  Multiple Vitamin (MULTIVITAMIN) tablet Take 1 tablet by mouth daily.      [provider]  omeprazole-sodium bicarbonate (ZEGERID) 40-1100 MG per capsule Take 1 capsule by mouth at bedtime.  10/16/13   [provider]  polyethylene glycol (MIRALAX / GLYCOLAX) packet Take 17 g by mouth daily as needed for mild constipation or moderate constipation.    [provider]  sertraline (ZOLOFT) 100 MG tablet TAKE 1 TABLET (100 MG TOTAL) BY MOUTH DAILY. Patient taking differently: Take 100 mg by mouth daily. TAKE 1 TABLET (100 MG TOTAL) BY MOUTH DAILY. 01/17/15   Rita Ohara, MD  valACYclovir (VALTREX) 1000 MG tablet Take 1 tablet (1,000 mg total) by mouth 3 (three) times daily. 12/11/18   Harrie Foreman, MD  XIIDRA 5 % SOLN Place 1 drop into both eyes 2 (two) times daily. 09/20/17   [provider]    Family History Family History  Problem Relation Age of Onset  . Hypertension Mother   . Allergies Mother   . Hyperlipidemia Mother   . Arthritis Mother   . Melanoma Mother   . Cancer Mother        uterine cancer and melanoma  . Heart disease Mother        atrial fibrillation  . Glaucoma Father   . Depression Father        (per daughter, never treated)  . Cancer Father        prostate cancer, diagnosed in 75's  . Colon polyps Father   . Glaucoma Sister   . Cancer Maternal Aunt        breast cancer in 40's  . Heart disease Maternal Aunt   . Heart disease Maternal Uncle   . Heart disease Maternal Uncle   . Stroke Maternal Grandmother   . Stroke Maternal Grandfather   . Diabetes Neg Hx     Social History Social History   Tobacco Use  . Smoking status: Never Smoker  . Smokeless tobacco: Never Used  Substance Use Topics  .  Alcohol use: Yes    Comment: 1 glass of wine per year.  . Drug use: No  Allergies   Darvocet [propoxyphene n-acetaminophen]; Etodolac; and Septra [bactrim]   Review of Systems Review of Systems  Constitutional: Negative for chills and fever.  HENT: Negative for sore throat and tinnitus.   Eyes: Negative for redness.  Respiratory: Negative for cough and shortness of breath.   Cardiovascular: Negative for chest pain and palpitations.  Gastrointestinal: Negative for abdominal pain, diarrhea, nausea and vomiting.  Genitourinary: Negative for dysuria, frequency and urgency.  Musculoskeletal: Negative for myalgias.  Skin: Positive for rash.       No lesions  Neurological: Negative for weakness.  Hematological: Does not bruise/bleed easily.  Psychiatric/Behavioral: Negative for suicidal ideas.     Physical Exam Triage Vital Signs ED Triage Vitals  Enc Vitals Group     BP 12/11/18 1018 (!) 179/68     Pulse Rate 12/11/18 1018 62     Resp 12/11/18 1018 18     Temp 12/11/18 1018 97.8 F (36.6 C)     Temp Source 12/11/18 1018 Oral     SpO2 12/11/18 1018 100 %     Weight 12/11/18 1015 178 lb (80.7 kg)     Height --      Head Circumference --      Peak Flow --      Pain Score 12/11/18 1015 10     Pain Loc --      Pain Edu? --      Excl. in [place]? --    No data found.  Updated Vital Signs BP (!) 179/68 (BP Location: Right Arm)   Pulse 62   Temp 97.8 F (36.6 C) (Oral)   Resp 18   Wt 80.7 kg   SpO2 100%   BMI 28.73 kg/m   Visual Acuity Right Eye Distance:   Left Eye Distance:   Bilateral Distance:    Right Eye Near:   Left Eye Near:    Bilateral Near:     Physical Exam Vitals signs and nursing note reviewed.  Constitutional:      General: She is not in acute distress.    Appearance: She is well-developed.  HENT:     Head: Normocephalic and atraumatic.  Eyes:     General: No scleral icterus.    Conjunctiva/sclera: Conjunctivae normal.     Pupils: Pupils  are equal, round, and reactive to light.  Neck:     Musculoskeletal: Normal range of motion and neck supple.     Thyroid: No thyromegaly.     Vascular: No JVD.     Trachea: No tracheal deviation.  Cardiovascular:     Rate and Rhythm: Normal rate and regular rhythm.     Heart sounds: Normal heart sounds. No murmur. No friction rub. No gallop.   Pulmonary:     Effort: Pulmonary effort is normal.     Breath sounds: Normal breath sounds.  Abdominal:     General: Bowel sounds are normal. There is no distension.     Palpations: Abdomen is soft.     Tenderness: There is no abdominal tenderness.  Musculoskeletal: Normal range of motion.  Lymphadenopathy:     Cervical: No cervical adenopathy.  Skin:    General: Skin is warm and dry.     Findings: Rash (dermatome distribution low back to upper thigh; erythematous base with tiny vesicles some of which are scabbed) present.  Neurological:     Mental Status: She is alert and oriented to person, place, and time.     Cranial Nerves: No cranial nerve deficit.  Psychiatric:        Behavior: Behavior normal.        Thought Content: Thought content normal.        Judgment: Judgment normal.      UC Treatments / Results  Labs (all labs ordered are listed, but only abnormal results are displayed) Labs Reviewed - No data to display  EKG None  Radiology No results found.  Procedures Procedures (including critical care time)  Medications Ordered in UC Medications - No data to display  Initial Impression / Assessment and Plan / UC Course  I have reviewed the triage vital signs and the nursing notes.  Pertinent labs & imaging results that were available during my care of the patient were reviewed by me and considered in my medical decision making (see chart for details).     Discussed zoster and post-herpetic neuralgia. Also to avoid pregnant women and children under 2  Final Clinical Impressions(s) / UC Diagnoses   Final diagnoses:   Herpes zoster without complication   Discharge Instructions   None    ED Prescriptions    Medication Sig Dispense Auth. Provider   gabapentin (NEURONTIN) 100 MG capsule Take 1 capsule (100 mg total) by mouth 3 (three) times daily for 15 days. May increase to 2 tabs po TID prn if nerve pain not improved 45 capsule Harrie Foreman, MD   valACYclovir (VALTREX) 1000 MG tablet Take 1 tablet (1,000 mg total) by mouth 3 (three) times daily. 21 tablet Harrie Foreman, MD     Controlled Substance Prescriptions [place] Controlled Substance Registry consulted? Not Applicable   Harrie Foreman, MD 12/11/18 1057

## 2018-12-11 NOTE — ED Triage Notes (Signed)
Pt cc she has a painful rash on her right side hip area  (rash blisters). X 6 days

## 2018-12-15 ENCOUNTER — Ambulatory Visit: Payer: BLUE CROSS/BLUE SHIELD | Admitting: Family Medicine

## 2018-12-15 ENCOUNTER — Encounter: Payer: Self-pay | Admitting: Family Medicine

## 2018-12-15 VITALS — BP 140/80 | HR 72 | Ht 66.0 in | Wt 176.4 lb

## 2018-12-15 DIAGNOSIS — B029 Zoster without complications: Secondary | ICD-10-CM

## 2018-12-15 NOTE — Progress Notes (Signed)
Chief Complaint  Patient presents with  . Herpes Zoster    pain from shingles is very severe, someimes feels numb. Appetite is decresed due to the pain, and somewhat nauseous and weak. Only getting enough food down to take the meds. Took 3 gabapentin during the night due to the pain (from dinner on) and a Tylenol #3.    Marland Kitchen other    pain first started Dec 15th in 3:30am, woke her up. Rash started 17th or 18th.     Symptoms started at right right lower abdomen, spreading to the back the week of 12/15. She woke up 12/15 with severe pain in her back, thought it was her usual back pain.  She went to the chiropractor twice that week.  Then the rash stated. She called Dr. Nelva Bush about her pain, who called in prednisone on 12/20.  She went to UC on 12/22, where she was diagnosed with shingles. Told to stop the prednisone, given Valtrex and gabapentin. She has been taking 100mg  TID of gabapentin. She has also been taking T#3 (that she has from Dr. Nelva Bush). Took one at bedtime 9:30.  Woke up with pain at 12:47, took another gabapentin and T#3, was up for an hour before she was able to get back to sleep. Woke up with pain again at 5am.  She woke up with right hand swelling, which has since resolved. She has felt weak, quivery, nauseated, very slight SOB (barely noticeable)  Pain seems to be worse in the evening, bearable during the day.  6/10 currently.  PMH, PSH, SH reviewed  Outpatient Encounter Medications as of 12/15/2018  Medication Sig Note  . acetaminophen-codeine (TYLENOL #3) 300-30 MG per tablet Take 1 tablet by mouth daily.   11/08/2013: Takes it once daily, rx'd by Dr. Nelva Bush, for chronic back pain/arthritis  . ascorbic acid (VITAMIN C) 500 MG tablet Take 1,000 mg by mouth daily.    . cholecalciferol (VITAMIN D) 1000 units tablet Take 1,000 Units by mouth daily.   Marland Kitchen docusate sodium (COLACE) 100 MG capsule Take 300 mg by mouth 2 (two) times daily.   . fluticasone (FLONASE) 50 MCG/ACT nasal spray  Place 2 sprays into the nose daily as needed for allergies. Reported on 01/20/2016   . gabapentin (NEURONTIN) 100 MG capsule Take 1 capsule (100 mg total) by mouth 3 (three) times daily for 15 days. May increase to 2 tabs po TID prn if nerve pain not improved   . ibuprofen (ADVIL,MOTRIN) 200 MG tablet Take 200-800 mg by mouth every 6 (six) hours as needed for headache or mild pain. 01/24/2018: Takes 2 almost every day  . Lifitegrast (XIIDRA) 5 % SOLN Xiidra 5 % eye drops in a dropperette   . lisinopril (PRINIVIL,ZESTRIL) 20 MG tablet Take 20 mg by mouth daily.   . Magnesium 250 MG TABS Take 1 tablet by mouth daily.     . Multiple Vitamin (MULTIVITAMIN) tablet Take 1 tablet by mouth daily.     Marland Kitchen omeprazole-sodium bicarbonate (ZEGERID) 40-1100 MG per capsule Take 1 capsule by mouth at bedtime.  11/08/2013: rx'd by Dr. Watt Climes  . Sennosides (SENOKOT PO) Take 1-2 tablets by mouth 2 (two) times daily.   . sertraline (ZOLOFT) 100 MG tablet TAKE 1 TABLET (100 MG TOTAL) BY MOUTH DAILY. (Patient taking differently: Take 100 mg by mouth daily. TAKE 1 TABLET (100 MG TOTAL) BY MOUTH DAILY.) 01/24/2018: Taking 100mg  daily  . valACYclovir (VALTREX) 1000 MG tablet Take 1 tablet (1,000 mg total) by mouth  3 (three) times daily.   . [DISCONTINUED] Calcium Carbonate-Vitamin D (CALCIUM 600+D) 600-400 MG-UNIT per tablet Take 1 tablet by mouth daily.    . methocarbamol (ROBAXIN) 500 MG tablet Take 500 mg by mouth every 8 (eight) hours as needed (neck pain). Reported on 01/20/2016 01/24/2018: 1 daily at work, rarely takes 2  . polyethylene glycol (MIRALAX / GLYCOLAX) packet Take 17 g by mouth daily as needed for mild constipation or moderate constipation.   . [DISCONTINUED] predniSONE (STERAPRED UNI-PAK 21 TAB) 5 MG (21) TBPK tablet Take 5 mg by mouth daily.   . [DISCONTINUED] XIIDRA 5 % SOLN Place 1 drop into both eyes 2 (two) times daily.    No facility-administered encounter medications on file as of 12/15/2018.    Allergies   Allergen Reactions  . Darvocet [Propoxyphene N-Acetaminophen] Other (See Comments)    Faint, blood pressure goes sky high.  . Etodolac Other (See Comments)    Red face, arms-looked like bad sunburn.  Sarina Ill [Bactrim] Hives   ROS:  No fever, chills, urinary complaints.  +nausea, feels somewhat weak, right sided back pain, rash, per HPI.  No headache, dizziness, chest pain. See HPI   PHYSICAL EXAM:  BP 140/80   Pulse 72   Ht 5\' 6"  (1.676 m)   Wt 176 lb 6.4 oz (80 kg)   BMI 28.47 kg/m   Pleasant female, in mild discomfort, seems somewhat guarded/anxious. Rash is in L2 distribution-- Scabbed multiple lesions at right upper thigh in dermatomal distribution, extending posteriorly to midline. Doesn't cross midline. Most are scabbed, ones in back healed a little more. No vesicles present.  No significant surrounding erythema, just at the base of the lesions.   ASSESSMENT/PLAN:  Herpes zoster without complication - Finish Valtrex course, try cool compresses. Cont T#3, contact us or Dr. Nelva Bush if not handling pain adequately. Gabapentin 100/100/300.     Complete the full course of Valtrex. You can increase the gabapentin to taking 3 together at bedtime, and continue 1 in the morning, 1 mid-day.  If needed, and tolerated, you can gradually increase the daytime doses. You may use Tylenol #3 every 4-6 hours as needed for pain, and can take up to two at a time, if needed (realizing that the side effects increase as the dose and frequency increase--sedation, constipation).  If pain isn't well controlled, wed would need to change the pain medication to a stronger/different one (and we would need to communicate this to Dr. Nelva Bush). Either way, you should let Dr. Nelva Bush know, since you will be using more Tylenol #3 than your baseline.

## 2018-12-15 NOTE — Patient Instructions (Addendum)
Complete the full course of Valtrex. You can increase the gabapentin to taking 3 together at bedtime, and continue 1 in the morning, 1 mid-day.  If needed, and tolerated, you can gradually increase the daytime doses. You may use Tylenol #3 every 4-6 hours as needed for pain, and can take up to two at a time, if needed (realizing that the side effects increase as the dose and frequency increase--sedation, constipation).  If pain isn't well controlled, wed would need to change the pain medication to a stronger/different one (and we would need to communicate this to Dr. Nelva Bush). Either way, you should let Dr. Nelva Bush know, since you will be using more Tylenol #3 than your baseline.    Shingles  Shingles, which is also known as herpes zoster, is an infection that causes a painful skin rash and fluid-filled blisters. It is caused by a virus. Shingles only develops in people who:  Have had chickenpox.  Have been given a medicine to protect against chickenpox (have been vaccinated). Shingles is rare in this group. What are the causes? Shingles is caused by varicella-zoster virus (VZV). This is the same virus that causes chickenpox. After a person is exposed to VZV, the virus stays in the body in an inactive (dormant) state. Shingles develops if the virus is reactivated. This can happen many years after the first (initial) exposure to VZV. It is not known what causes this virus to be reactivated. What increases the risk? People who have had chickenpox or received the chickenpox vaccine are at risk for shingles. Shingles infection is more common in people who:  Are older than age 53.  Have a weakened disease-fighting system (immune system), such as people with: ? HIV. ? AIDS. ? Cancer.  Are taking medicines that weaken the immune system, such as transplant medicines.  Are experiencing a lot of stress. What are the signs or symptoms? Early symptoms of this condition include itching, tingling,  and pain in an area on your skin. Pain may be described as burning, stabbing, or throbbing. A few days or weeks after early symptoms start, a painful red rash appears. The rash is usually on one side of the body and has a band-like or belt-like pattern. The rash eventually turns into fluid-filled blisters that break open, change into scabs, and dry up in about 2-3 weeks. At any time during the infection, you may also develop:  A fever.  Chills.  A headache.  An upset stomach. How is this diagnosed? This condition is diagnosed with a skin exam. Skin or fluid samples may be taken from the blisters before a diagnosis is made. These samples are examined under a microscope or sent to a lab for testing. How is this treated? The rash may last for several weeks. There is not a specific cure for this condition. Your health care provider will probably prescribe medicines to help you manage pain, recover more quickly, and avoid long-term problems. Medicines may include:  Antiviral drugs.  Anti-inflammatory drugs.  Pain medicines.  Anti-itching medicines (antihistamines). If the area involved is on your face, you may be referred to a specialist, such as an eye doctor (ophthalmologist) or an ear, nose, and throat (ENT) doctor (otolaryngologist) to help you avoid eye problems, chronic pain, or disability. Follow these instructions at home: Medicines  Take over-the-counter and prescription medicines only as told by your health care provider.  Apply an anti-itch cream or numbing cream to the affected area as told by your health care provider. Relieving  itching and discomfort   Apply cold, wet cloths (cold compresses) to the area of the rash or blisters as told by your health care provider.  Cool baths can be soothing. Try adding baking soda or dry oatmeal to the water to reduce itching. Do not bathe in hot water. Blister and rash care  Keep your rash covered with a loose bandage (dressing).  Wear loose-fitting clothing to help ease the pain of material rubbing against the rash.  Keep your rash and blisters clean by washing the area with mild soap and cool water as told by your health care provider.  Check your rash every day for signs of infection. Check for: ? More redness, swelling, or pain. ? Fluid or blood. ? Warmth. ? Pus or a bad smell.  Do not scratch your rash or pick at your blisters. To help avoid scratching: ? Keep your fingernails clean and cut short. ? Wear gloves or mittens while you sleep, if scratching is a problem. General instructions  Rest as told by your health care provider.  Keep all follow-up visits as told by your health care provider. This is important.  Wash your hands often with soap and water. If soap and water are not available, use hand sanitizer. Doing this lowers your chance of getting a bacterial skin infection.  Before your blisters change into scabs, your shingles infection can cause chickenpox in people who have never had it or have never been vaccinated against it. To prevent this from happening, avoid contact with other people, especially: ? Babies. ? Pregnant women. ? Children who have eczema. ? Elderly people who have transplants. ? People who have chronic illnesses, such as cancer or AIDS. Contact a health care provider if:  Your pain is not relieved with prescribed medicines.  Your pain does not get better after the rash heals.  You have signs of infection in the rash area, such as: ? More redness, swelling, or pain around the rash. ? Fluid or blood coming from the rash. ? The rash area feeling warm to the touch. ? Pus or a bad smell coming from the rash. Get help right away if:  The rash is on your face or nose.  You have facial pain, pain around your eye area, or loss of feeling on one side of your face.  You have difficulty seeing.  You have ear pain or have ringing in your ear.  You have a loss of  taste.  Your condition gets worse. Summary  Shingles, which is also known as herpes zoster, is an infection that causes a painful skin rash and fluid-filled blisters.  This condition is diagnosed with a skin exam. Skin or fluid samples may be taken from the blisters and examined before the diagnosis is made.  Keep your rash covered with a loose bandage (dressing). Wear loose-fitting clothing to help ease the pain of material rubbing against the rash.  Before your blisters change into scabs, your shingles infection can cause chickenpox in people who have never had it or have never been vaccinated against it. This information is not intended to replace advice given to you by your health care provider. Make sure you discuss any questions you have with your health care provider. Document Released: 12/07/2005 Document Revised: 08/11/2017 Document Reviewed: 08/11/2017 Elsevier Interactive Patient Education  2019 [employer].

## 2018-12-16 ENCOUNTER — Encounter: Payer: Self-pay | Admitting: Family Medicine

## 2018-12-20 ENCOUNTER — Other Ambulatory Visit: Payer: Self-pay

## 2018-12-20 ENCOUNTER — Encounter (HOSPITAL_COMMUNITY): Payer: Self-pay | Admitting: Emergency Medicine

## 2018-12-20 ENCOUNTER — Emergency Department (HOSPITAL_COMMUNITY)
Admission: EM | Admit: 2018-12-20 | Discharge: 2018-12-21 | Disposition: A | Discharge status: Home / Self Care | Payer: BLUE CROSS/BLUE SHIELD | Attending: Emergency Medicine | Admitting: Emergency Medicine

## 2018-12-20 ENCOUNTER — Emergency Department (HOSPITAL_COMMUNITY): Payer: BLUE CROSS/BLUE SHIELD

## 2018-12-20 DIAGNOSIS — R1084 Generalized abdominal pain: Secondary | ICD-10-CM

## 2018-12-20 DIAGNOSIS — T402X5A Adverse effect of other opioids, initial encounter: Secondary | ICD-10-CM | POA: Diagnosis not present

## 2018-12-20 DIAGNOSIS — B029 Zoster without complications: Secondary | ICD-10-CM

## 2018-12-20 DIAGNOSIS — K5903 Drug induced constipation: Secondary | ICD-10-CM | POA: Diagnosis not present

## 2018-12-20 DIAGNOSIS — Z79899 Other long term (current) drug therapy: Secondary | ICD-10-CM | POA: Diagnosis not present

## 2018-12-20 DIAGNOSIS — K314 Gastric diverticulum: Secondary | ICD-10-CM | POA: Diagnosis not present

## 2018-12-20 DIAGNOSIS — R1031 Right lower quadrant pain: Secondary | ICD-10-CM | POA: Diagnosis present

## 2018-12-20 LAB — COMPREHENSIVE METABOLIC PANEL
ALT: 47 U/L — ABNORMAL HIGH (ref 0–44)
ANION GAP: 11 (ref 5–15)
AST: 39 U/L (ref 15–41)
Albumin: 4.4 g/dL (ref 3.5–5.0)
Alkaline Phosphatase: 97 U/L (ref 38–126)
BILIRUBIN TOTAL: 0.6 mg/dL (ref 0.3–1.2)
BUN: 15 mg/dL (ref 8–23)
CO2: 24 mmol/L (ref 22–32)
Calcium: 9.2 mg/dL (ref 8.9–10.3)
Chloride: 99 mmol/L (ref 98–111)
Creatinine, Ser: 0.75 mg/dL (ref 0.44–1.00)
Glucose, Bld: 100 mg/dL — ABNORMAL HIGH (ref 70–99)
POTASSIUM: 4.3 mmol/L (ref 3.5–5.1)
Sodium: 134 mmol/L — ABNORMAL LOW (ref 135–145)
TOTAL PROTEIN: 7.4 g/dL (ref 6.5–8.1)

## 2018-12-20 LAB — CBC
HCT: 42.7 % (ref 36.0–46.0)
HEMOGLOBIN: 14.3 g/dL (ref 12.0–15.0)
MCH: 32.1 pg (ref 26.0–34.0)
MCHC: 33.5 g/dL (ref 30.0–36.0)
MCV: 96 fL (ref 80.0–100.0)
Platelets: 270 10*3/uL (ref 150–400)
RBC: 4.45 MIL/uL (ref 3.87–5.11)
RDW: 12.3 % (ref 11.5–15.5)
WBC: 8.7 10*3/uL (ref 4.0–10.5)
nRBC: 0 % (ref 0.0–0.2)

## 2018-12-20 LAB — LIPASE, BLOOD: Lipase: 43 U/L (ref 11–51)

## 2018-12-20 MED ORDER — SODIUM CHLORIDE (PF) 0.9 % IJ SOLN
INTRAMUSCULAR | Status: AC
  Administered 2018-12-20: 23:00:00
  Filled 2018-12-20: qty 50

## 2018-12-20 MED ORDER — ONDANSETRON HCL 4 MG/2ML IJ SOLN
4.0000 mg | Freq: Once | INTRAMUSCULAR | Status: AC
  Administered 2018-12-20: 4 mg via INTRAVENOUS
  Filled 2018-12-20: qty 2

## 2018-12-20 MED ORDER — IOPAMIDOL (ISOVUE-300) INJECTION 61%
INTRAVENOUS | Status: AC
  Filled 2018-12-20: qty 100

## 2018-12-20 MED ORDER — SODIUM CHLORIDE 0.9 % IV BOLUS
1000.0000 mL | Freq: Once | INTRAVENOUS | Status: AC
  Administered 2018-12-20: 1000 mL via INTRAVENOUS

## 2018-12-20 MED ORDER — KETOROLAC TROMETHAMINE 30 MG/ML IJ SOLN
30.0000 mg | Freq: Once | INTRAMUSCULAR | Status: AC
  Administered 2018-12-20: 30 mg via INTRAVENOUS
  Filled 2018-12-20: qty 1

## 2018-12-20 MED ORDER — IOPAMIDOL (ISOVUE-300) INJECTION 61%
100.0000 mL | Freq: Once | INTRAVENOUS | Status: AC | PRN
  Administered 2018-12-20: 100 mL via INTRAVENOUS

## 2018-12-20 NOTE — ED Notes (Signed)
Patent has a gold top in the main lab

## 2018-12-20 NOTE — ED Provider Notes (Signed)
[facility] DEPT Provider Note   CSN: [id number] Arrival date & time: 12/20/18  1926     History   Chief Complaint Chief Complaint  Patient presents with  . Abdominal Pain    HPI Amanda Baldwin is a 63 y.o. female with h/o HTN, HLD, depression, colitis, c. Diff diarrhea, chronic constipation, diverticulosis, recently diagnosed shingles to right hip/low back is here for evaluation of abdominal pain. Onset Sunday night.  Located to RLQ with radiating to RUQ and epigastrium. Described as sharp, crampy, constant.  Associated with constipation x 2 days, nausea, vomiting, sweats, feeling hot, feeling light-headed, bloating.  Has had small BMs in last 2 days but no passing gas.  She thinks constipation is from taking tylenol 3s and gabapentin for shingles pain.  She started using dulcolax, senacot and stool softeners without relief.  H/o chronic constipation but this is worse.  No abd surgeries, h/o obstructions.    Denies fevers, dysuria, hematuria, frequency. HPI  Past Medical History:  Diagnosis Date  . Allergic rhinitis, cause unspecified    seasonal  . C. difficile colitis 09/24/2017   treated with oral vancomycin per [facility].   Marland Kitchen Chronic constipation   . Chronic pain    f/b Dr. Ramos--multilevel cervical DDD, disk bulge L5-S1  . Colitis 08/29/2017   treated with Cipro and Flagyl. ( [facility])   . Depression    h/o hospitalization '87  . Elevated liver function tests    normal work-up; some fatty liver on u/s.  mild elevations intermittently  . Hemorrhoids 12/08   internal and external  . Hiatal hernia   . Hyperlipidemia 11/07   good HDL  . Hypertension   . Melanoma of thigh ([place]) 1999   R thigh, level 1 (Dr. Allyson Sabal)  . Plantar fasciitis, bilateral    L worse than R; Dr. Paulla Dolly (in past)  . Reflux   . Vitamin D deficiency 2010    Patient Active Problem List   Diagnosis Date Noted  . Colitis 08/29/2017  . Depression, major, in remission  ([place]) 01/17/2015  . Other malaise and fatigue 11/08/2013  . Plantar fasciitis 11/08/2013  . Depressive disorder, not elsewhere classified 11/08/2013  . Abnormal ECG 06/21/2012  . GERD (gastroesophageal reflux disease) 06/01/2012  . Essential hypertension, benign 10/20/2011  . C. difficile colitis 12/21/1992    Past Surgical History:  Procedure Laterality Date  . [place]   Right breast--removal of benign tumor and benign cyst  . COLONOSCOPY  11/2007, 11/2012   Dr. Watt Climes  . DILATION AND CURETTAGE OF UTERUS  2006   bleeding (benign)  . ESOPHAGOGASTRODUODENOSCOPY  11/2012   Dr. Watt Climes  . excision of melanoma  1999   R thigh  . NASAL SINUS SURGERY  1995, 2002  . SHOULDER SURGERY  03-2011   Right; bone spur removal (Dr. Theda Sers)  . [place]   artificial disks in TMJ; Dr. Terence Lux     OB History    Gravida  0   Para  0   Term  0   Preterm  0   AB  0   Living  0     SAB  0   TAB  0   Ectopic  0   Multiple  0   Live Births               Home Medications    Prior to Admission medications   Medication Sig  Start Date End Date Taking? Authorizing Provider  acetaminophen-codeine (TYLENOL #3) 300-30 MG per tablet Take 1 tablet by mouth every 6 (six) hours as needed for moderate pain.    Yes [provider]  ascorbic acid (VITAMIN C) 500 MG tablet Take 1,000 mg by mouth daily.    Yes [provider]  cholecalciferol (VITAMIN D) 1000 units tablet Take 1,000 Units by mouth daily.   Yes [provider]  docusate sodium (COLACE) 100 MG capsule Take 100 mg by mouth 3 (three) times daily as needed for mild constipation or moderate constipation.    Yes [provider]  gabapentin (NEURONTIN) 100 MG capsule Take 1 capsule (100 mg total) by mouth 3 (three) times daily for 15 days. May increase to 2 tabs po TID prn if nerve pain not improved 12/11/18 12/26/18 Yes Harrie Foreman, MD    ibuprofen (ADVIL,MOTRIN) 200 MG tablet Take 200-800 mg by mouth every 6 (six) hours as needed for headache or mild pain.   Yes [provider]  Lifitegrast Shirley Friar) 5 % SOLN Place 1 drop into both eyes 2 (two) times daily.    Yes [provider]  lisinopril (PRINIVIL,ZESTRIL) 20 MG tablet Take 20 mg by mouth daily.   Yes [provider]  Magnesium 250 MG TABS Take 1 tablet by mouth every evening.    Yes [provider]  methocarbamol (ROBAXIN) 500 MG tablet Take 500 mg by mouth every 8 (eight) hours as needed (neck pain). Reported on 01/20/2016   Yes [provider]  Multiple Vitamin (MULTIVITAMIN) tablet Take 1 tablet by mouth daily.     Yes [provider]  omeprazole-sodium bicarbonate (ZEGERID) 40-1100 MG per capsule Take 1 capsule by mouth at bedtime.  10/16/13  Yes [provider]  Sennosides (SENOKOT PO) Take 1-2 tablets by mouth 2 (two) times daily as needed (constipation).    Yes [provider]  sertraline (ZOLOFT) 100 MG tablet TAKE 1 TABLET (100 MG TOTAL) BY MOUTH DAILY. Patient taking differently: Take 100 mg by mouth daily. TAKE 1 TABLET (100 MG TOTAL) BY MOUTH DAILY. 01/17/15  Yes Rita Ohara, MD  fluticasone [facility]) 50 MCG/ACT nasal spray Place 2 sprays into the nose daily as needed for allergies. Reported on 01/20/2016    [provider]  polyethylene glycol (MIRALAX / GLYCOLAX) packet Take 17 g by mouth daily as needed for mild constipation or moderate constipation.    [provider]  valACYclovir (VALTREX) 1000 MG tablet Take 1 tablet (1,000 mg total) by mouth 3 (three) times daily. Patient not taking: Reported on 12/20/2018 12/11/18   Harrie Foreman, MD    Family History Family History  Problem Relation Age of Onset  . Hypertension Mother   . Allergies Mother   . Hyperlipidemia Mother   . Arthritis Mother   . Melanoma Mother   . Cancer Mother        uterine cancer and melanoma   . Heart disease Mother        atrial fibrillation  . Glaucoma Father   . Depression Father        (per daughter, never treated)  . Cancer Father        prostate cancer, diagnosed in 47's  . Colon polyps Father   . Glaucoma Sister   . Cancer Maternal Aunt        breast cancer in 40's  . Heart disease Maternal Aunt   . Heart disease Maternal Uncle   .  Heart disease Maternal Uncle   . Stroke Maternal Grandmother   . Stroke Maternal Grandfather   . Diabetes Neg Hx     Social History Social History   Tobacco Use  . Smoking status: Never Smoker  . Smokeless tobacco: Never Used  Substance Use Topics  . Alcohol use: Yes    Comment: 1 glass of wine per year.  . Drug use: No     Allergies   Darvocet [propoxyphene n-acetaminophen]; Etodolac; and Septra [bactrim]   Review of Systems Review of Systems  Constitutional: Positive for chills.  Gastrointestinal: Positive for abdominal pain, constipation, nausea and vomiting.       Bloating   Skin: Positive for rash (shingles).  All other systems reviewed and are negative.    Physical Exam Updated Vital Signs BP (!) 163/77 (BP Location: Left Arm)   Pulse 62   Temp 97.6 F (36.4 C) (Oral)   Resp 15   SpO2 94%   Physical Exam Vitals signs and nursing note reviewed.  Constitutional:      Appearance: She is well-developed.     Comments: Non toxic  HENT:     Head: Normocephalic and atraumatic.     Nose: Nose normal.  Eyes:     Conjunctiva/sclera: Conjunctivae normal.     Pupils: Pupils are equal, round, and reactive to light.  Neck:     Musculoskeletal: Normal range of motion.  Cardiovascular:     Rate and Rhythm: Normal rate and regular rhythm.  Pulmonary:     Effort: Pulmonary effort is normal.     Breath sounds: Normal breath sounds.  Abdominal:     General: Bowel sounds are absent.     Palpations: Abdomen is soft.     Tenderness: There is abdominal tenderness in the right lower quadrant, periumbilical area  and suprapubic area. There is guarding.     Comments: TTP with guarding to RLQ, right mid abdomen.  Positive Mcburney's.  Negative murphy's. No suprapubic or CVA tenderness. No BS to lower quadrants.   Musculoskeletal: Normal range of motion.  Skin:    General: Skin is warm and dry.     Capillary Refill: Capillary refill takes less than 2 seconds.     Findings: Rash present.     Comments: Vesicular rash with scabs to right hip/groin and right low back    Neurological:     Mental Status: She is alert and oriented to person, place, and time.  Psychiatric:        Behavior: Behavior normal.      ED Treatments / Results  Labs (all labs ordered are listed, but only abnormal results are displayed) Labs Reviewed  COMPREHENSIVE METABOLIC PANEL - Abnormal; Notable for the following components:      Result Value   Sodium 134 (*)    Glucose, Bld 100 (*)    ALT 47 (*)    All other components within normal limits  URINALYSIS, ROUTINE W REFLEX MICROSCOPIC - Abnormal; Notable for the following components:   Color, Urine STRAW (*)    All other components within normal limits  LIPASE, BLOOD  CBC    EKG None  Radiology Ct Abdomen Pelvis W Contrast  Result Date: 12/20/2018 CLINICAL DATA:  Generalized abdominal pain EXAM: CT ABDOMEN AND PELVIS WITH CONTRAST TECHNIQUE: Multidetector CT imaging of the abdomen and pelvis was performed using the standard protocol following bolus administration of intravenous contrast. CONTRAST:  100 mL Isovue 300 intravenous COMPARISON:  CT 09/23/2017 FINDINGS: Lower chest: Small  fat density focus at the extreme left lung base. No acute consolidation or pleural effusion. Stable small pulmonary nodules in the left lung base measuring up to 3 mm in size. Heart size is within normal limits. Hepatobiliary: No focal liver abnormality is seen. No gallstones, gallbladder wall thickening, or biliary dilatation. Pancreas: Unremarkable. No pancreatic ductal dilatation or  surrounding inflammatory changes. Spleen: Normal in size without focal abnormality. Adrenals/Urinary Tract: Adrenal glands are within normal limits. Mild scarring upper pole left kidney. No hydronephrosis. Distended urinary bladder. Stomach/Bowel: Diverticulum off the posterior gastric fundus as before. No dilated small bowel. No colon wall thickening. Increased stool burden suggesting constipation. Negative appendix. Vascular/Lymphatic: Nonaneurysmal aorta.  No significant adenopathy Reproductive: Uterus and bilateral adnexa are unremarkable. Other: Negative for free air or free fluid. Musculoskeletal: Stable sclerosis and degenerative changes at L5-S1. IMPRESSION: 1. No CT evidence for acute intra-abdominal or pelvic abnormality. Increased stool burden suggesting constipation. 2. Probable scarring upper pole left kidney 3. Stable small gastric diverticulum Electronically Signed   By: Donavan Foil M.D.   On: 12/20/2018 23:31    Procedures Procedures (including critical care time)  Medications Ordered in ED Medications  sodium chloride 0.9 % bolus 1,000 mL (0 mLs Intravenous Stopped 12/20/18 2359)  ondansetron (ZOFRAN) injection 4 mg (4 mg Intravenous Given 12/20/18 2226)  ketorolac (TORADOL) 30 MG/ML injection 30 mg (30 mg Intravenous Given 12/20/18 2227)  iopamidol (ISOVUE-300) 61 % injection 100 mL (100 mLs Intravenous Contrast Given 12/20/18 2257)  sodium chloride (PF) 0.9 % injection (  Given by Other 12/20/18 2315)  magnesium citrate solution 1 Bottle (1 Bottle Oral Given 12/21/18 0027)  HYDROmorphone (DILAUDID) injection 1 mg (1 mg Intravenous Given 12/21/18 0027)  ondansetron (ZOFRAN) injection 4 mg (4 mg Intravenous Given 12/21/18 0027)     Initial Impression / Assessment and Plan / ED Course  I have reviewed the triage vital signs and the nursing notes.  Pertinent labs & imaging results that were available during my care of the patient were reviewed by me and considered in my medical  decision making (see chart for details).     ddx includes constipation related abdominal pain vs SBO.  She has RLQ tenderness however clinical pictures less suggestive of appendicitis given recent narcotic pain meds, constipation.  She has no urinary symptoms, suprapubic or CVA tenderness to suggest GU infection.  She has h/o colitis, c.diff, but no diarrhea, fever.  I have lower suspicion for ischemic colitis.  Labs reassuring.  Given age, focal tenderness to RLQ, constipation and lack of flatus, will obtain CTAP.   0049: CTAP with constipation but otherwise reassuring.  Pt abd pain improved but her shingles pain broke through. States this happens at night time, she is in tears.  She is frustrated due to ongoing shingles pain now with constipation from pain medicines.  She feels like she can't control her pain and pain meds are causing her constipation.  Her pain is 10/10 and she looks uncomfortable.  I discussed risks of narcotic pain medicines including slowed gut motility but pt agreeble to get one dose prior to discharge.  Mag citrate bottle given here.  She will be dc with miralax BID until BMs regulate.  I instructed f/u with pcp in 72 hours for re-evaluation of constipation and continued shingles pain.  Return preacutions give.  Pt in agreement.   Final Clinical Impressions(s) / ED Diagnoses   Final diagnoses:  Constipation due to opioid therapy  Acute pain associated with herpes zoster  Generalized abdominal pain    ED Discharge Orders    None       Arlean Hopping 12/21/18 1962    Drenda Freeze, MD 12/21/18 1149

## 2018-12-20 NOTE — ED Triage Notes (Addendum)
Patient c/o generalized abdominal pain worsening since yesterday. Reports constipation after taking pain meds for shingles. Also c/o N/V. Hx chronic constipation.

## 2018-12-21 LAB — URINALYSIS, ROUTINE W REFLEX MICROSCOPIC
BILIRUBIN URINE: NEGATIVE
Glucose, UA: NEGATIVE mg/dL
HGB URINE DIPSTICK: NEGATIVE
KETONES UR: NEGATIVE mg/dL
Leukocytes, UA: NEGATIVE
Nitrite: NEGATIVE
Protein, ur: NEGATIVE mg/dL
SPECIFIC GRAVITY, URINE: 1.019 (ref 1.005–1.030)
pH: 7 (ref 5.0–8.0)

## 2018-12-21 MED ORDER — HYDROMORPHONE HCL 1 MG/ML IJ SOLN
1.0000 mg | Freq: Once | INTRAMUSCULAR | Status: AC
  Administered 2018-12-21: 1 mg via INTRAVENOUS
  Filled 2018-12-21: qty 1

## 2018-12-21 MED ORDER — ONDANSETRON HCL 4 MG/2ML IJ SOLN
4.0000 mg | Freq: Once | INTRAMUSCULAR | Status: AC
  Administered 2018-12-21: 4 mg via INTRAVENOUS
  Filled 2018-12-21: qty 2

## 2018-12-21 MED ORDER — MAGNESIUM CITRATE PO SOLN
1.0000 | Freq: Once | ORAL | Status: AC
  Administered 2018-12-21: 1 via ORAL
  Filled 2018-12-21: qty 296

## 2018-12-21 NOTE — Discharge Instructions (Addendum)
You were seen in the ER for abdominal pain.   CT showed large stool burden consistent with constipation.   Start cup full of miralax powder twice a day. Cut back to once a day once bowel movements become regular.  Always take miralax with narcotic pain medications.   Return to ER for worsening abdominal pain, vomiting, no bowel movement despite miralax, fevers, chills.   See your primary care doctor in 48-72 hours to ensure symptoms are improving. Talk to your doctor about your continued shingles pain.

## 2018-12-22 ENCOUNTER — Telehealth: Payer: Self-pay | Admitting: Family Medicine

## 2018-12-22 DIAGNOSIS — B029 Zoster without complications: Secondary | ICD-10-CM

## 2018-12-22 MED ORDER — GABAPENTIN 100 MG PO CAPS: ORAL_CAPSULE | ORAL | 1 refills | Status: DC

## 2018-12-22 NOTE — Telephone Encounter (Signed)
Advise pt refill sent. Advise that I'm aware of her visit over the holiday.  I hope she is feeling better.

## 2018-12-22 NOTE — Telephone Encounter (Signed)
Patient advised.

## 2018-12-22 NOTE — Telephone Encounter (Signed)
Pt called and stated that she only haves two Gabapentin left. She was given this by the urgent care. She is requesting refills. Please send to [facility] on [address]. Pt can be reached at [phone].

## 2018-12-26 ENCOUNTER — Encounter: Payer: Self-pay | Admitting: Family Medicine

## 2018-12-26 ENCOUNTER — Telehealth: Payer: Self-pay | Admitting: Family Medicine

## 2018-12-26 DIAGNOSIS — B0229 Other postherpetic nervous system involvement: Secondary | ICD-10-CM

## 2018-12-26 MED ORDER — TRAMADOL HCL 50 MG PO TABS: 50.0000 mg | ORAL_TABLET | Freq: Four times a day (QID) | ORAL | 0 refills | Status: DC | PRN

## 2018-12-26 NOTE — Telephone Encounter (Signed)
Pt called and stated that her shingles pain has gotten worse at night. Pt stated that she has been taking 2 Gabapentin, 2 Tylenol 3 and 4 Advil and the pain wakes her up 2-3 hours after that. Pt wanted to know if there is anything else she can take for pain. Pt is at work but can be reached on her cell  at [phone]

## 2018-12-26 NOTE — Telephone Encounter (Signed)
Advised 

## 2018-12-26 NOTE — Telephone Encounter (Signed)
Epic shut down while typing last note. See message/reply to patient (MyChart message) for detalis. Will try changing to Tramadol (to minimize risk for excessive tylenol, may work a little better than the codeine), and to try titrating up the gabapentin (daytime dose too, may need to try on the weekend).  To touch base with [place] later in the week to let us know how it is working.  Veronica--please let Dr. Jeralyn Ruths office know that we have precribed #30 of tramadol for her (instead of her using the T#3 for her shingles pain, which hasn't been very effective at night.)

## 2018-12-26 NOTE — Telephone Encounter (Signed)
   Can take up to 600mg  of gabapentin at bedtime (start at 400mg , and increase bedtime dose if needed).

## 2018-12-27 ENCOUNTER — Encounter: Payer: Self-pay | Admitting: Family Medicine

## 2018-12-28 ENCOUNTER — Encounter: Payer: Self-pay | Admitting: Family Medicine

## 2019-01-02 ENCOUNTER — Encounter: Payer: Self-pay | Admitting: Family Medicine

## 2019-01-02 ENCOUNTER — Other Ambulatory Visit: Payer: Self-pay | Admitting: *Deleted

## 2019-01-02 ENCOUNTER — Ambulatory Visit: Payer: BLUE CROSS/BLUE SHIELD | Admitting: Family Medicine

## 2019-01-02 VITALS — BP 150/90 | HR 80 | Temp 99.2°F | Ht 66.0 in | Wt 168.4 lb

## 2019-01-02 DIAGNOSIS — Z5181 Encounter for therapeutic drug level monitoring: Secondary | ICD-10-CM | POA: Diagnosis not present

## 2019-01-02 DIAGNOSIS — R1013 Epigastric pain: Secondary | ICD-10-CM

## 2019-01-02 DIAGNOSIS — B0229 Other postherpetic nervous system involvement: Secondary | ICD-10-CM | POA: Diagnosis not present

## 2019-01-02 DIAGNOSIS — R109 Unspecified abdominal pain: Secondary | ICD-10-CM | POA: Diagnosis not present

## 2019-01-02 LAB — POCT URINALYSIS DIP (PROADVANTAGE DEVICE)
BILIRUBIN UA: NEGATIVE mg/dL
Bilirubin, UA: NEGATIVE
Blood, UA: NEGATIVE
Glucose, UA: NEGATIVE mg/dL
LEUKOCYTES UA: NEGATIVE
Nitrite, UA: NEGATIVE
PROTEIN UA: NEGATIVE mg/dL
SPECIFIC GRAVITY, URINE: 1.01
UUROB: NEGATIVE
pH, UA: 7 (ref 5.0–8.0)

## 2019-01-02 MED ORDER — LIDOCAINE VISCOUS HCL 2 % MT SOLN
5.0000 mL | Freq: Once | OROMUCOSAL | Status: AC
  Administered 2019-01-02: 5 mL via ORAL

## 2019-01-02 MED ORDER — ALUM & MAG HYDROXIDE-SIMETH 200-200-20 MG/5ML PO SUSP
10.0000 mL | Freq: Once | ORAL | Status: AC
  Administered 2019-01-02: 10 mL via ORAL

## 2019-01-02 MED ORDER — DIPHENHYDRAMINE HCL 12.5 MG/5ML PO ELIX
25.0000 mg | ORAL_SOLUTION | Freq: Once | ORAL | Status: AC
  Administered 2019-01-02: 25 mg via ORAL

## 2019-01-02 MED ORDER — DEXLANSOPRAZOLE 60 MG PO CPDR: 60.0000 mg | DELAYED_RELEASE_CAPSULE | Freq: Every day | ORAL | 0 refills | Status: DC

## 2019-01-02 NOTE — Patient Instructions (Signed)
We gave you a GI cocktail in the office to help with acid/indigestion. We are giving you samples of Dexilant to use INSTEAD of your Zegerid (just temporarily, to see if it helps more with your stomach pain).  Avoid citrus, which may be exacerbating your pain. Eat a bland diet (BRAT--bananas, rice, applesauce, toast, chicken noodle soup, etc)--avoid spicy, greasy, fried foods, tomato-based foods, citrus and caffeine today.

## 2019-01-02 NOTE — Progress Notes (Signed)
Chief Complaint  Patient presents with  . Abdominal Pain    she has this pain the entire time she has had the shingles. Entered a different phase this weekend. Hands are shaking. Dr.Ramos gave her hydrocodone Thursday. Wonders if she is having a reaction to the hydrocodone. She stopped it and the gabapentin as well. Barely slept last night. Wonders if she has an ulcer. Pain really high and at times into her chest but in her stomach as well. Tried to get in to see Dr. Watt Climes today but could not.    Patient presents with complaint of abdominal pain.  Pain is epigastric, but last night went into her chest.  Some twinges on the right side of abdomen also. She had diarrhea yesterday.  None last night, but an hour ago she started with more diarrhea (2 episodes). Once with wiping she noticed a spot of blood. +nausea, no vomiting. Prior to onset of diarrhea, bowels have been okay (passing small stools, soft, formed). Denies significant constipation (as was noted with her abdominal pain at ER visit on 12/31)  She denies any known fever.  She is feeling tremulous/shaky, and feels cold at night, not sure if it is chills or not.  +hot/cold last night.  She took Gas-X yesterday, due to feeling bloated. Didn't seem to help.  She didn't take gabapentin, hydrocodone or T#3 last night, just took 2 advil at 2am and used lidocaine cream.  Her stomach was the "greater pain", which kept her from sleeping, more so than the shingles pain.  She got hydrocodone rx from Dr. Nelva Bush on Thursday. She took it Thursday--1 at bedtime, only lasted 3 hours, but waited 6 hours to take second pill Friday she took 1/2 tablet at bedtime, so she could take another half when pain came back 3 hours later.  Took a 3rd half tablet 3 hours later (total of 1.5 tablets on Friday and Saturday nights).  Had been taking between 2-4 gabapentin at night, not able to tolerate during the day. Made her too sleepy to take 1 during the day.     Drinking lemon juice all day yesterday (thought that would be good for her with her diarrhea).  12/31 had CT abdomen--unremarkable except for stool burden.  PMH, PSH, SH reviewed  Outpatient Encounter Medications as of 01/02/2019  Medication Sig Note  . ascorbic acid (VITAMIN C) 500 MG tablet Take 1,000 mg by mouth daily.    . cholecalciferol (VITAMIN D) 1000 units tablet Take 1,000 Units by mouth daily.   . fluticasone (FLONASE) 50 MCG/ACT nasal spray Place 2 sprays into the nose daily as needed for allergies. Reported on 01/20/2016   . ibuprofen (ADVIL,MOTRIN) 200 MG tablet Take 200-800 mg by mouth every 6 (six) hours as needed for headache or mild pain.   Marland Kitchen LIDOCAINE EX Apply topically.   Marland Kitchen Lifitegrast (XIIDRA) 5 % SOLN Place 1 drop into both eyes 2 (two) times daily.    Marland Kitchen lisinopril (PRINIVIL,ZESTRIL) 20 MG tablet Take 20 mg by mouth daily.   . Magnesium 250 MG TABS Take 1 tablet by mouth every evening.    . Multiple Vitamin (MULTIVITAMIN) tablet Take 1 tablet by mouth daily.     Marland Kitchen omeprazole-sodium bicarbonate (ZEGERID) 40-1100 MG per capsule Take 1 capsule by mouth at bedtime.  11/08/2013: rx'd by Dr. Watt Climes  . polyethylene glycol (MIRALAX / GLYCOLAX) packet Take 17 g by mouth daily as needed for mild constipation or moderate constipation.   . sertraline (ZOLOFT) 100  MG tablet TAKE 1 TABLET (100 MG TOTAL) BY MOUTH DAILY. (Patient taking differently: Take 100 mg by mouth daily. TAKE 1 TABLET (100 MG TOTAL) BY MOUTH DAILY.)   . simethicone (MYLICON) 585 MG chewable tablet Chew 125 mg by mouth every 6 (six) hours as needed for flatulence. 01/02/2019: Took 4 yesterday, throughout the day  . [DISCONTINUED] Dexlansoprazole (DEXILANT PO) Take 1 tablet by mouth daily.   Marland Kitchen acetaminophen-codeine (TYLENOL #3) 300-30 MG per tablet Take 1 tablet by mouth every 6 (six) hours as needed for moderate pain.    Marland Kitchen docusate sodium (COLACE) 100 MG capsule Take 100 mg by mouth 3 (three) times daily as needed  for mild constipation or moderate constipation.    . gabapentin (NEURONTIN) 100 MG capsule Take as directed--1-2 pills in morning, 1-2 mid-day, and 3 at bedtime (Patient not taking: Reported on 01/02/2019) 01/02/2019: Had been taking between 2-4 at night, not able to tolerate during the day.  Marland Kitchen HYDROcodone-acetaminophen (NORCO) 10-325 MG tablet Norco 10 mg-325 mg tablet  Take 1 tablet 4 times a day by oral route as needed.   . methocarbamol (ROBAXIN) 500 MG tablet Take 500 mg by mouth every 8 (eight) hours as needed (neck pain). Reported on 01/20/2016   . Sennosides (SENOKOT PO) Take 1-2 tablets by mouth 2 (two) times daily as needed (constipation).    . traMADol (ULTRAM) 50 MG tablet Take 1-2 tablets (50-100 mg total) by mouth every 6 (six) hours as needed for severe pain. (Patient not taking: Reported on 01/02/2019)   . [DISCONTINUED] valACYclovir (VALTREX) 1000 MG tablet Take 1 tablet (1,000 mg total) by mouth 3 (three) times daily. (Patient not taking: Reported on 12/20/2018)   . [EXPIRED] alum & mag hydroxide-simeth (MAALOX/MYLANTA) 200-200-20 MG/5ML suspension 10 mL    . [EXPIRED] diphenhydrAMINE (BENADRYL) 12.5 MG/5ML elixir 25 mg    . [EXPIRED] lidocaine (XYLOCAINE) 2 % viscous mouth solution 5 mL     No facility-administered encounter medications on file as of 01/02/2019.    Allergies  Allergen Reactions  . Darvocet [Propoxyphene N-Acetaminophen] Other (See Comments)    Faint, blood pressure goes sky high.  . Etodolac Other (See Comments)    Red face, arms-looked like bad sunburn.  Sarina Ill [Bactrim] Hives   ROS:  +subjective hot/cold, nausea, diarrhea and epigastric pain. No dysuria, no flank pain. No URI symptoms. See HPI   PHYSICAL EXAM:  BP (!) 150/90   Pulse 80   Temp 99.2 F (37.3 C) (Tympanic)   Ht 5\' 6"  (1.676 m)   Wt 168 lb 6.4 oz (76.4 kg)   BMI 27.18 kg/m   Mildly ill and tired-appearing female, in no acute distress HEENT: conjunctiva and sclera are clear, EOMI. OP  clear Neck: no lymphadenopathy, thyromegaly or mass Heart: regular rate and rhythm Lungs: clear bilaterally Back: no spinal or CVA tenderness Abdomen: Diffuse epigastric pain Decreased BS, but present. No rebound or guarding. Non-distended, no mass Rectal: Heme negative, soft light brown stool Skin: area of shingles (right upper thigh, hip and low back)--residual erythema, but skin has healed/intact. Psych: appears tired, anxious. Normal hygiene and grooming Neuro: alert and oriented, cranial nerves intact, normal gait  Urine dip: SG 1.010, normal   ASSESSMENT/PLAN:  Epigastric pain - suspect flare of reflux/gastritis, poss related to NSAIDs vs dietary change (lemon yesterday); GI cocktail; change to Dexilant samples temporarily. Bland diet - Plan: Comprehensive metabolic panel, CBC with Differential/Platelet, Lipase, diphenhydrAMINE (BENADRYL) 12.5 MG/5ML elixir 25 mg, lidocaine (XYLOCAINE) 2 %  viscous mouth solution 5 mL, alum & mag hydroxide-simeth (MAALOX/MYLANTA) 200-200-20 MG/5ML suspension 10 mL  Abdominal pain, unspecified abdominal location - Plan: POCT Urinalysis DIP (Proadvantage Device), diphenhydrAMINE (BENADRYL) 12.5 MG/5ML elixir 25 mg, lidocaine (XYLOCAINE) 2 % viscous mouth solution 5 mL, alum & mag hydroxide-simeth (MAALOX/MYLANTA) 200-200-20 MG/5ML suspension 10 mL  Medication monitoring encounter - Plan: Comprehensive metabolic panel, CBC with Differential/Platelet  Postherpetic neuralgia - continues to have pain. topical lidocaine used for the first time now that skin has healed, and was helpful--continue.   Called pt at 5:20 to see how she was feeling: Ate chicken noodle soup, applesauce and took [place]. Pain later started to ease off Not pain free at 5:20, but feeling much better. Diarrhea has improved, went to the bathroom a few times today, "slowed down", sometimes just had urgency, but didn't pass much. Overall, feeling better.  But very anxious about  tonight. Reminded her of reflux precautions. And to use the lidocaine topical to minimize pain med use. Will be in touch with lab results tomorrow.

## 2019-01-03 ENCOUNTER — Emergency Department (HOSPITAL_COMMUNITY): Payer: BLUE CROSS/BLUE SHIELD

## 2019-01-03 ENCOUNTER — Emergency Department (HOSPITAL_COMMUNITY)
Admission: EM | Admit: 2019-01-03 | Discharge: 2019-01-03 | Disposition: A | Discharge status: Home / Self Care | Payer: BLUE CROSS/BLUE SHIELD | Attending: Emergency Medicine | Admitting: Emergency Medicine

## 2019-01-03 ENCOUNTER — Encounter (HOSPITAL_COMMUNITY): Payer: Self-pay | Admitting: Emergency Medicine

## 2019-01-03 ENCOUNTER — Encounter: Payer: Self-pay | Admitting: Family Medicine

## 2019-01-03 ENCOUNTER — Other Ambulatory Visit: Payer: Self-pay

## 2019-01-03 DIAGNOSIS — Z79899 Other long term (current) drug therapy: Secondary | ICD-10-CM | POA: Diagnosis not present

## 2019-01-03 DIAGNOSIS — E785 Hyperlipidemia, unspecified: Secondary | ICD-10-CM | POA: Insufficient documentation

## 2019-01-03 DIAGNOSIS — R1031 Right lower quadrant pain: Secondary | ICD-10-CM | POA: Insufficient documentation

## 2019-01-03 DIAGNOSIS — R1011 Right upper quadrant pain: Secondary | ICD-10-CM | POA: Diagnosis not present

## 2019-01-03 DIAGNOSIS — R1084 Generalized abdominal pain: Secondary | ICD-10-CM | POA: Diagnosis not present

## 2019-01-03 DIAGNOSIS — R109 Unspecified abdominal pain: Secondary | ICD-10-CM

## 2019-01-03 DIAGNOSIS — R945 Abnormal results of liver function studies: Secondary | ICD-10-CM | POA: Diagnosis not present

## 2019-01-03 DIAGNOSIS — A0472 Enterocolitis due to Clostridium difficile, not specified as recurrent: Secondary | ICD-10-CM | POA: Diagnosis not present

## 2019-01-03 DIAGNOSIS — R197 Diarrhea, unspecified: Secondary | ICD-10-CM | POA: Insufficient documentation

## 2019-01-03 DIAGNOSIS — I10 Essential (primary) hypertension: Secondary | ICD-10-CM | POA: Diagnosis not present

## 2019-01-03 LAB — CBC
HCT: 40.7 % (ref 36.0–46.0)
Hemoglobin: 13.9 g/dL (ref 12.0–15.0)
MCH: 31.7 pg (ref 26.0–34.0)
MCHC: 34.2 g/dL (ref 30.0–36.0)
MCV: 92.9 fL (ref 80.0–100.0)
NRBC: 0 % (ref 0.0–0.2)
Platelets: 375 10*3/uL (ref 150–400)
RBC: 4.38 MIL/uL (ref 3.87–5.11)
RDW: 12 % (ref 11.5–15.5)
WBC: 7.8 10*3/uL (ref 4.0–10.5)

## 2019-01-03 LAB — CBC WITH DIFFERENTIAL/PLATELET
Basophils Absolute: 0 10*3/uL (ref 0.0–0.2)
Basos: 1 %
EOS (ABSOLUTE): 0 10*3/uL (ref 0.0–0.4)
Eos: 1 %
HEMATOCRIT: 36.4 % (ref 34.0–46.6)
Hemoglobin: 12.8 g/dL (ref 11.1–15.9)
Immature Grans (Abs): 0 10*3/uL (ref 0.0–0.1)
Immature Granulocytes: 0 %
LYMPHS: 26 %
Lymphocytes Absolute: 1.4 10*3/uL (ref 0.7–3.1)
MCH: 31.4 pg (ref 26.6–33.0)
MCHC: 35.2 g/dL (ref 31.5–35.7)
MCV: 89 fL (ref 79–97)
Monocytes Absolute: 0.4 10*3/uL (ref 0.1–0.9)
Monocytes: 8 %
NEUTROS PCT: 64 %
Neutrophils Absolute: 3.3 10*3/uL (ref 1.4–7.0)
Platelets: 338 10*3/uL (ref 150–450)
RBC: 4.08 x10E6/uL (ref 3.77–5.28)
RDW: 11.8 % (ref 11.7–15.4)
WBC: 5.2 10*3/uL (ref 3.4–10.8)

## 2019-01-03 LAB — URINALYSIS, ROUTINE W REFLEX MICROSCOPIC
Bilirubin Urine: NEGATIVE
Glucose, UA: NEGATIVE mg/dL
Hgb urine dipstick: NEGATIVE
Ketones, ur: 5 mg/dL — AB
Leukocytes, UA: NEGATIVE
Nitrite: NEGATIVE
Protein, ur: NEGATIVE mg/dL
Specific Gravity, Urine: 1.005 (ref 1.005–1.030)
pH: 6 (ref 5.0–8.0)

## 2019-01-03 LAB — COMPREHENSIVE METABOLIC PANEL
ALT: 46 IU/L — AB (ref 0–32)
ALT: 45 U/L — AB (ref 0–44)
AST: 28 IU/L (ref 0–40)
AST: 31 U/L (ref 15–41)
Albumin/Globulin Ratio: 2.3 — ABNORMAL HIGH (ref 1.2–2.2)
Albumin: 4.5 g/dL (ref 3.6–4.8)
Albumin: 4.5 g/dL (ref 3.5–5.0)
Alkaline Phosphatase: 130 IU/L — ABNORMAL HIGH (ref 39–117)
Alkaline Phosphatase: 108 U/L (ref 38–126)
Anion gap: 13 (ref 5–15)
BILIRUBIN TOTAL: 0.5 mg/dL (ref 0.0–1.2)
BUN/Creatinine Ratio: 18 (ref 12–28)
BUN: 12 mg/dL (ref 8–27)
BUN: 8 mg/dL (ref 8–23)
CHLORIDE: 98 mmol/L (ref 96–106)
CO2: 18 mmol/L — AB (ref 20–29)
CO2: 19 mmol/L — ABNORMAL LOW (ref 22–32)
CREATININE: 0.68 mg/dL (ref 0.57–1.00)
CREATININE: 0.68 mg/dL (ref 0.44–1.00)
Calcium: 9.4 mg/dL (ref 8.7–10.3)
Calcium: 9.7 mg/dL (ref 8.9–10.3)
Chloride: 100 mmol/L (ref 98–111)
GFR calc Af Amer: 107 mL/min/{1.73_m2} (ref >59)
GFR calc Af Amer: >60 mL/min (ref >60)
GFR calc non Af Amer: 93 mL/min/{1.73_m2} (ref >59)
GFR calc non Af Amer: >60 mL/min (ref >60)
GLUCOSE: 117 mg/dL — AB (ref 65–99)
Globulin, Total: 2 g/dL (ref 1.5–4.5)
Glucose, Bld: 120 mg/dL — ABNORMAL HIGH (ref 70–99)
Potassium: 4.4 mmol/L (ref 3.5–5.2)
Potassium: 3.8 mmol/L (ref 3.5–5.1)
Sodium: 133 mmol/L — ABNORMAL LOW (ref 134–144)
Sodium: 132 mmol/L — ABNORMAL LOW (ref 135–145)
Total Bilirubin: 0.9 mg/dL (ref 0.3–1.2)
Total Protein: 6.5 g/dL (ref 6.0–8.5)
Total Protein: 7.4 g/dL (ref 6.5–8.1)

## 2019-01-03 LAB — LIPASE, BLOOD: Lipase: 62 U/L — ABNORMAL HIGH (ref 11–51)

## 2019-01-03 LAB — LIPASE: Lipase: 82 U/L — ABNORMAL HIGH (ref 14–72)

## 2019-01-03 LAB — C DIFFICILE QUICK SCREEN W PCR REFLEX
C DIFFICLE (CDIFF) ANTIGEN: NEGATIVE
C Diff interpretation: NOT DETECTED
C Diff toxin: NEGATIVE

## 2019-01-03 MED ORDER — ONDANSETRON 4 MG PO TBDP
4.0000 mg | ORAL_TABLET | Freq: Once | ORAL | Status: AC
  Administered 2019-01-03: 4 mg via ORAL
  Filled 2019-01-03: qty 1

## 2019-01-03 MED ORDER — SODIUM CHLORIDE 0.9 % IV BOLUS
1000.0000 mL | Freq: Once | INTRAVENOUS | Status: AC
  Administered 2019-01-03: 1000 mL via INTRAVENOUS

## 2019-01-03 MED ORDER — ONDANSETRON HCL 4 MG/2ML IJ SOLN: 4.0000 mg | Freq: Once | INTRAMUSCULAR | Status: DC

## 2019-01-03 MED ORDER — ONDANSETRON 4 MG PO TBDP: 4.0000 mg | ORAL_TABLET | Freq: Three times a day (TID) | ORAL | 0 refills | Status: DC | PRN

## 2019-01-03 MED ORDER — LIDOCAINE HCL 4 % EX SOLN: Freq: Once | CUTANEOUS | Status: DC

## 2019-01-03 MED ORDER — ONDANSETRON HCL 4 MG/2ML IJ SOLN
4.0000 mg | Freq: Once | INTRAMUSCULAR | Status: AC
  Administered 2019-01-03: 4 mg via INTRAVENOUS
  Filled 2019-01-03: qty 2

## 2019-01-03 NOTE — ED Triage Notes (Signed)
Pt c/o right side abdominal pain with radiation and diarrhea.

## 2019-01-03 NOTE — ED Provider Notes (Signed)
[facility] EMERGENCY DEPARTMENT Provider Note   CSN: [id number] Arrival date & time: 01/03/19  1117     History   Chief Complaint Chief Complaint  Patient presents with  . Abdominal Pain  . Diarrhea    HPI Amanda Baldwin is a 63 y.o. female with a history of C. difficile colitis, hyperlipidemia, chronic constipation, depression, elevated LFTs, and hypertension who presents to the ED with complaints of abdominal pain, diarrhea, and generalized weakness x 2-3 days. Patient has had issues with pain related to shingles in the R hip/buttock area for the past 1 month- she has been on multiple medicines for this including tylenol 3s, gabapentin, and tramadol. Ultimately without much relief was subsequently placed on hydrocodone- she was taking a 1/2 tablet every 3 hours with some improvement and utilizing lidocaine cream w/ this as well. She states that 3 days prior she felt shaky and short of breath briefly- she discussed with the pharmacist who informed her this may be a side effect therefore she discontinued it use. She states that the subsequent day she noted some R sided abdominal pain (somewhat similar to her ER visit 12/31 w/ CT scan that showed constipation). She states pain is intermittent w/ associated diarrhea- she reports TNTC episodes of non bloody diarrhea per day, states she has had at least 8-10 episodes this AM. She has felt nauseous without vomiting and has felt generally weak with poor PO intake. She saw her PCP yesterday for this and was placed on a PPI w/ some improvement of pain. Saw GI today who recommended ER evaluation. Denies fever, chills, vomiting, melena, hematochezia, dysuria, hematuria, or frequency. Denies chest pain or dyspnea. Denies recent abx, foreign travel, or hospitalizations. She has a hx of C. Diff and states this may feel similar, but not exactly- she was given supplies for c. Diff testing at home by her GI doctor.    HPI  Past Medical  History:  Diagnosis Date  . Allergic rhinitis, cause unspecified    seasonal  . C. difficile colitis 09/24/2017   treated with oral vancomycin per [facility].   Marland Kitchen Chronic constipation   . Chronic pain    f/b Dr. Ramos--multilevel cervical DDD, disk bulge L5-S1  . Colitis 08/29/2017   treated with Cipro and Flagyl. ( [facility])   . Depression    h/o hospitalization '87  . Elevated liver function tests    normal work-up; some fatty liver on u/s.  mild elevations intermittently  . Hemorrhoids 12/08   internal and external  . Hiatal hernia   . Hyperlipidemia 11/07   good HDL  . Hypertension   . Melanoma of thigh ([employer]) 1999   R thigh, level 1 (Dr. Allyson Sabal)  . Plantar fasciitis, bilateral    L worse than R; Dr. Paulla Dolly (in past)  . Reflux   . Vitamin D deficiency 2010    Patient Active Problem List   Diagnosis Date Noted  . Colitis 08/29/2017  . Depression, major, in remission ([place]) 01/17/2015  . Other malaise and fatigue 11/08/2013  . Plantar fasciitis 11/08/2013  . Depressive disorder, not elsewhere classified 11/08/2013  . Abnormal ECG 06/21/2012  . GERD (gastroesophageal reflux disease) 06/01/2012  . Essential hypertension, benign 10/20/2011  . C. difficile colitis 12/21/1992    Past Surgical History:  Procedure Laterality Date  . [place]   Right breast--removal of benign tumor and benign cyst  . COLONOSCOPY  11/2007, 11/2012   Dr. Watt Climes  .  DILATION AND CURETTAGE OF UTERUS  2006   bleeding (benign)  . ESOPHAGOGASTRODUODENOSCOPY  11/2012   Dr. Watt Climes  . excision of melanoma  1999   R thigh  . NASAL SINUS SURGERY  1995, 2002  . SHOULDER SURGERY  03-2011   Right; bone spur removal (Dr. Theda Sers)  . [place]   artificial disks in TMJ; Dr. Terence Lux     OB History    Gravida  0   Para  0   Term  0   Preterm  0   AB  0   Living  0     SAB  0   TAB  0   Ectopic  0   Multiple  0   Live Births                Home Medications    Prior to Admission medications   Medication Sig Start Date End Date Taking? Authorizing Provider  acetaminophen-codeine (TYLENOL #3) 300-30 MG per tablet Take 1 tablet by mouth every 6 (six) hours as needed for moderate pain.     [provider]  ascorbic acid (VITAMIN C) 500 MG tablet Take 1,000 mg by mouth daily.     [provider]  cholecalciferol (VITAMIN D) 1000 units tablet Take 1,000 Units by mouth daily.    [provider]  dexlansoprazole (DEXILANT) 60 MG capsule Take 1 capsule (60 mg total) by mouth daily. 01/02/19   Rita Ohara, MD  docusate sodium (COLACE) 100 MG capsule Take 100 mg by mouth 3 (three) times daily as needed for mild constipation or moderate constipation.     [provider]  fluticasone (FLONASE) 50 MCG/ACT nasal spray Place 2 sprays into the nose daily as needed for allergies. Reported on 01/20/2016    [provider]  gabapentin (NEURONTIN) 100 MG capsule Take as directed--1-2 pills in morning, 1-2 mid-day, and 3 at bedtime Patient not taking: Reported on 01/02/2019 12/22/18   Rita Ohara, MD  HYDROcodone-acetaminophen [facility]) 10-325 MG tablet Norco 10 mg-325 mg tablet  Take 1 tablet 4 times a day by oral route as needed.    [provider]  ibuprofen (ADVIL,MOTRIN) 200 MG tablet Take 200-800 mg by mouth every 6 (six) hours as needed for headache or mild pain.    [provider]  LIDOCAINE EX Apply topically.    [provider]  Lifitegrast [facility]) 5 % SOLN Place 1 drop into both eyes 2 (two) times daily.     [provider]  lisinopril (PRINIVIL,ZESTRIL) 20 MG tablet Take 20 mg by mouth daily.    [provider]  Magnesium 250 MG TABS Take 1 tablet by mouth every evening.     [provider]  methocarbamol (ROBAXIN) 500 MG tablet Take 500 mg by mouth every 8 (eight) hours as needed (neck pain). Reported on 01/20/2016    [provider]  Multiple Vitamin (MULTIVITAMIN) tablet Take 1 tablet by mouth daily.      [provider]  omeprazole-sodium bicarbonate (ZEGERID) 40-1100 MG per capsule Take 1 capsule by mouth at bedtime.  10/16/13   [provider]  polyethylene glycol (MIRALAX / GLYCOLAX) packet Take 17 g by mouth daily as needed for mild constipation or moderate constipation.    [provider]  Sennosides (SENOKOT PO) Take 1-2 tablets by mouth 2 (two) times daily as needed (constipation).     [provider]  sertraline (ZOLOFT) 100 MG tablet  TAKE 1 TABLET (100 MG TOTAL) BY MOUTH DAILY. Patient taking differently: Take 100 mg by mouth daily. TAKE 1 TABLET (100 MG TOTAL) BY MOUTH DAILY. 01/17/15   Rita Ohara, MD  simethicone (MYLICON) 219 MG chewable tablet Chew 125 mg by mouth every 6 (six) hours as needed for flatulence.    [provider]  traMADol (ULTRAM) 50 MG tablet Take 1-2 tablets (50-100 mg total) by mouth every 6 (six) hours as needed for severe pain. Patient not taking: Reported on 01/02/2019 12/26/18   Rita Ohara, MD    Family History Family History  Problem Relation Age of Onset  . Hypertension Mother   . Allergies Mother   . Hyperlipidemia Mother   . Arthritis Mother   . Melanoma Mother   . Cancer Mother        uterine cancer and melanoma  . Heart disease Mother        atrial fibrillation  . Glaucoma Father   . Depression Father        (per daughter, never treated)  . Cancer Father        prostate cancer, diagnosed in 81's  . Colon polyps Father   . Glaucoma Sister   . Cancer Maternal Aunt        breast cancer in 40's  . Heart disease Maternal Aunt   . Heart disease Maternal Uncle   . Heart disease Maternal Uncle   . Stroke Maternal Grandmother   . Stroke Maternal Grandfather   . Diabetes Neg Hx     Social History Social History   Tobacco Use  . Smoking status: Never Smoker  . Smokeless tobacco: Never Used  Substance Use Topics  . Alcohol  use: Yes    Comment: 1 glass of wine per year.  . Drug use: No     Allergies   Darvocet [propoxyphene n-acetaminophen]; Etodolac; and Septra [bactrim]   Review of Systems Review of Systems  Constitutional: Positive for chills. Negative for fever.  Respiratory: Negative for shortness of breath.   Cardiovascular: Negative for chest pain.  Gastrointestinal: Positive for abdominal pain, diarrhea and nausea. Negative for blood in stool and vomiting.  Genitourinary: Negative for dysuria, frequency and urgency.  Neurological: Positive for weakness (generalized).  All other systems reviewed and are negative.    Physical Exam Updated Vital Signs BP (!) 152/81 (BP Location: Left Arm)   Pulse 87   Temp 97.8 F (36.6 C) (Oral)   Resp 17   Ht 5\' 6"  (1.676 m)   Wt 75.3 kg   SpO2 99%   BMI 26.79 kg/m   Physical Exam Vitals signs and nursing note reviewed.  Constitutional:      General: She is not in acute distress.    Appearance: She is well-developed. She is not toxic-appearing.  HENT:     Head: Normocephalic and atraumatic.     Mouth/Throat:     Comments: Mucous membranes appear a bit dry. Eyes:     General:        Right eye: No discharge.        Left eye: No discharge.     Conjunctiva/sclera: Conjunctivae normal.  Neck:     Musculoskeletal: Neck supple.  Cardiovascular:     Rate and Rhythm: Normal rate and regular rhythm.  Pulmonary:     Effort: Pulmonary effort is normal. No respiratory distress.     Breath sounds: Normal breath sounds. No wheezing, rhonchi or rales.  Abdominal:     General: There  is no distension.     Palpations: Abdomen is soft.     Tenderness: There is abdominal tenderness in the right upper quadrant and right lower quadrant. There is no guarding or rebound. Negative signs include Murphy's sign and McBurney's sign.  Skin:    General: Skin is warm and dry.     Findings: Rash (Papular rash noted to right anterior thigh wrapping around to the  gluteal area.  No vesicles.  No significant surrounding erythema.  Not warm to touch.  No palpable fluctuance.  No purulent drainage.) present.  Neurological:     Mental Status: She is alert.     Comments: Clear speech.   Psychiatric:        Behavior: Behavior normal.      ED Treatments / Results  Labs (all labs ordered are listed, but only abnormal results are displayed) Labs Reviewed  LIPASE, BLOOD - Abnormal; Notable for the following components:      Result Value   Lipase 62 (*)    All other components within normal limits  COMPREHENSIVE METABOLIC PANEL - Abnormal; Notable for the following components:   Sodium 132 (*)    CO2 19 (*)    Glucose, Bld 120 (*)    ALT 45 (*)    All other components within normal limits  CBC  URINALYSIS, ROUTINE W REFLEX MICROSCOPIC    EKG None  Radiology Dg Abdomen Acute W/chest  Result Date: 01/03/2019 CLINICAL DATA:  Abdominal pain and diarrhea EXAM: DG ABDOMEN ACUTE W/ 1V CHEST COMPARISON:  CT abdomen and pelvis December 20, 2018. FINDINGS: PA chest: There is no appreciable edema or consolidation. The heart size and pulmonary vascularity are normal. No adenopathy. Supine and upright abdomen: There is moderate stool in the colon. There is no bowel dilatation or air-fluid level to suggest bowel obstruction. No free air. There is lumbar dextroscoliosis. IMPRESSION: Moderate stool in colon. No bowel obstruction or free air. No lung edema or consolidation. Electronically Signed   By: Lowella Grip III M.D.   On: 01/03/2019 15:14    Procedures Procedures (including critical care time)  Medications Ordered in ED Medications  lidocaine (XYLOCAINE) 4 % external solution ( Topical Not Given 01/03/19 2059)  sodium chloride 0.9 % bolus 1,000 mL (0 mLs Intravenous Stopped 01/03/19 1747)  ondansetron (ZOFRAN) injection 4 mg (4 mg Intravenous Given 01/03/19 1422)  sodium chloride 0.9 % bolus 1,000 mL (0 mLs Intravenous Stopped 01/03/19 2056)    ondansetron (ZOFRAN-ODT) disintegrating tablet 4 mg (4 mg Oral Given 01/03/19 2104)    Prior Results reviewed: Ct Abdomen Pelvis W Contrast  Result Date: 12/20/2018 CLINICAL DATA:  Generalized abdominal pain EXAM: CT ABDOMEN AND PELVIS WITH CONTRAST TECHNIQUE: Multidetector CT imaging of the abdomen and pelvis was performed using the standard protocol following bolus administration of intravenous contrast. CONTRAST:  100 mL Isovue 300 intravenous COMPARISON:  CT 09/23/2017 FINDINGS: Lower chest: Small fat density focus at the extreme left lung base. No acute consolidation or pleural effusion. Stable small pulmonary nodules in the left lung base measuring up to 3 mm in size. Heart size is within normal limits. Hepatobiliary: No focal liver abnormality is seen. No gallstones, gallbladder wall thickening, or biliary dilatation. Pancreas: Unremarkable. No pancreatic ductal dilatation or surrounding inflammatory changes. Spleen: Normal in size without focal abnormality. Adrenals/Urinary Tract: Adrenal glands are within normal limits. Mild scarring upper pole left kidney. No hydronephrosis. Distended urinary bladder. Stomach/Bowel: Diverticulum off the posterior gastric fundus as before. No dilated small  bowel. No colon wall thickening. Increased stool burden suggesting constipation. Negative appendix. Vascular/Lymphatic: Nonaneurysmal aorta.  No significant adenopathy Reproductive: Uterus and bilateral adnexa are unremarkable. Other: Negative for free air or free fluid. Musculoskeletal: Stable sclerosis and degenerative changes at L5-S1. IMPRESSION: 1. No CT evidence for acute intra-abdominal or pelvic abnormality. Increased stool burden suggesting constipation. 2. Probable scarring upper pole left kidney 3. Stable small gastric diverticulum Electronically Signed   By: Donavan Foil M.D.   On: 12/20/2018 23:31   Initial Impression / Assessment and Plan / ED Course  I have reviewed the triage vital signs and  the nursing notes.  Pertinent labs & imaging results that were available during my care of the patient were reviewed by me and considered in my medical decision making (see chart for details).   Patient presents to the ED for abdominal discomfort, diarrhea, and generalized weakness. Patient is nontoxic appearing, in no apparent distress, her vitals are WNL with the exception of elevated BP- low suspicion for HTN emergency. Exam with mildly dry mucous membranes and some mild RUQ/RLQ tenderness to the abdomen, no peritoneal signs. She does have rash consistent with hx of shingles, does not appear to have superimposed bacterial infection. Further evaluation with labs & abdominal series per discussion with supervising physician Dr. Lita Mains. Fluids & anti-emetics ordered.   Patient's labs have been reviewed: No leukocytosis. No anemia. Mild hyponatremia, mildly low bicarb. Hyperglycemia to 120. No anion gap elevation. Lipase mildly elevated, low suspicion for acute pancreatitis- trending down from labs yesterday. LFTs without significant change from prior. UA without UTI. C.diff testing negative. Xray with Moderate stool in colon. No bowel obstruction or free air. No lung edema or consolidation  Repeat abdominal exam remains without peritoneal signs, low suspicion for appendicitis, cholecystitis, pancreatitis, diverticulitis, bowel obstruction/perforation, do not feel repeat CT imaging is necessary w/ recent imaging that was negative for acute surgical etiology w/ similar presentation. Unclear definitive etiology. Hx of constipation and does have stool burden on xray. Also could be component of viral GI illness. Tolerating PO. Feeling improved following fluids and zofran. Will discharge home with zofran and GI/PCP follow up w/ ER return precautions. I discussed results, treatment plan, need for follow-up, and return precautions with the patient. Provided opportunity for questions, patient confirmed  understanding and is in agreement with plan.   Findings and plan of care discussed with supervising physician Dr. Lita Mains- in agreement.    Final Clinical Impressions(s) / ED Diagnoses   Final diagnoses:  Abdominal pain, unspecified abdominal location  Diarrhea, unspecified type    ED Discharge Orders         Ordered    ondansetron (ZOFRAN ODT) 4 MG disintegrating tablet  Every 8 hours PRN     01/03/19 2043           Amaryllis Dyke, PA-C 01/03/19 2106    Julianne Rice, MD 01/12/19 [phone]

## 2019-01-03 NOTE — Discharge Instructions (Signed)
You are seen in the ER today for abdominal pain, weakness, and diarrhea.  Your work-up here was overall reassuring.  You were given fluids and antinausea medicine.  We are sending home with Zofran, the medicine you received in the ER, take this every 8 hours as needed for nausea or vomiting.  Please take Tylenol per over-the-counter dosing to help with discomfort.  Please continue to use your lidocaine cream at home.  We have prescribed you new medication(s) today. Discuss the medications prescribed today with your pharmacist as they can have adverse effects and interactions with your other medicines including over the counter and prescribed medications. Seek medical evaluation if you start to experience new or abnormal symptoms after taking one of these medicines, seek care immediately if you start to experience difficulty breathing, feeling of your throat closing, facial swelling, or rash as these could be indications of a more serious allergic reaction  Please follow-up with your primary care provider within the next 1 to 3 days.  Return to the ER for new or worsening symptoms or any other concerns.

## 2019-01-04 ENCOUNTER — Ambulatory Visit: Payer: BLUE CROSS/BLUE SHIELD | Admitting: Family Medicine

## 2019-01-04 ENCOUNTER — Telehealth: Payer: Self-pay | Admitting: *Deleted

## 2019-01-04 NOTE — Telephone Encounter (Signed)
We should send no show letter (since she did schedule an miss an appointment). But [place] please call her tomorrow, and see how she is feeling. Thanks  (sending to [place] for letter, and V to call pt)

## 2019-01-04 NOTE — Telephone Encounter (Signed)

## 2019-01-05 ENCOUNTER — Encounter: Payer: Self-pay | Admitting: Family Medicine

## 2019-01-05 ENCOUNTER — Other Ambulatory Visit: Payer: Self-pay | Admitting: *Deleted

## 2019-01-05 MED ORDER — DEXLANSOPRAZOLE 60 MG PO CPDR: 60.0000 mg | DELAYED_RELEASE_CAPSULE | Freq: Every day | ORAL | 0 refills | Status: DC

## 2019-01-05 NOTE — Telephone Encounter (Signed)
Spoke with patient and she did say overall she is doing much better. Still having some nausea and constipation. She is drinking water and thinks the fluids at the hospital did help. Still having some shingles pain, worse at night-using lidocaine jelly. She is not sleeping well but dealing. Thanked [place] for checking on her.

## 2019-01-17 ENCOUNTER — Encounter: Payer: Self-pay | Admitting: Family Medicine

## 2019-01-17 NOTE — Telephone Encounter (Signed)
No show letter sent.

## 2019-01-24 NOTE — Progress Notes (Signed)
Chief Complaint  Patient presents with  . Annual Exam    fasting annual exam no pap-sees Dr. Benjie Baldwin. Also just saw Dr. Donato Baldwin last week fro eye exam. Still having some GI issues, AmandaMagod gave her new medication last week and she thinks it's helping.     Amanda Baldwin is a 63 y.o. female who presents for a complete physical.  She has the following concerns:  She was diagnosed with shingles 12/22, and had been suffering with a lot of pain since then.  Pain medications caused a lot of GI issues (constipation, pain, bloating).  She took hydrocodone only for 3 days (from Dr. Nelva Baldwin), and then stopped everything except the topical lidocaine.  Pain has resolved. She notes just slight itching starting at her right hip.  She also developed abdominal pain, and was in the ER twice (first was related to constipation from pain meds; second was for IV fluids, related to dehydration from diarrhea), and saw Dr. Watt Baldwin.  She was last seen here 1/13 with epigastric pain and diarrhea.  She was changed to Amanda Baldwin, which was effective in relieving her epigastric and chest discomfort. She filled rx for Dexilant, didn't take it due to concerns about increased risk C.diff but is back to taking Zegerid.  She was recently put on Bentyl by Dr. Watt Baldwin, taking it BID (since yesterday, had started it just at night at first).  Seems to help some with "heaviness" in her abdomen.  Took it yesterday while at work and having abdominal pain and it helped. Had been waking up with discomfort up into her chest, seems to have improved some after starting the Bentyl.  HTN--Blood pressures haven't been checked regularly, other than at work, and BP's have been okay (high during her pain/illness).  10/31 on 126/70 at work. She recalls that at GYN 9/4 it was 142/80. Amanda Baldwin (NP at work) changed from metoprolol 47m to Lisinopril 257min 2018.  She has just a slight cough (likely from postnasal drainage, chronic). Cough is minor, more  intermittent.  Denies side effects. BP's elevated at her recent visits due to significant pain/illness. BP Readings from Last 3 Encounters:  01/03/19 (!) 148/74  01/02/19 (!) 150/90  12/21/18 (!) 163/77   She was noted to have asymmetry of kidneys on 01/2017 abdominal [place] She was sent for Duplex which didn't show significant blockage (1-59% bilaterally).  Dr. EaDonnetta Hutchingad said to consider CT angiogram (if kidney function remains normal) only if she has persistent difficult to control hypertension (which she hasn't demonstrated). Denies headaches, dizziness, chest pain, palpitations.  She had echo back in 2013, when she was having significant fatigue.  Notable for moderate diastolic dysfunction, EF 6029-56% Amanda Baldwin is stable (not as bad as in 2013).  No known snoring. Doesn't fall asleep while driving.   Depression: Doing well on Zoloft, moods good. She is taking 10041mnd is doing well.  Hyperlipidemia--previously took meds, but has been off lipid-lowering meds for years, and numbers have been okay. Has been following low cholesterol diet. [facility] labs showed much higher cholesterol in October, 2018.  Rechecked at her physical last year (results below).  At that time, diet consisted of red meat 2-3x/week, some cheese, skim, eats 2 hard boiled egg whites daily. Currently having 1 fried egg daily (7/week) rather than the egg whiles, switched several months ago. Lab Results  Component Value Date   CHOL 236 (H) 01/24/2018   HDL 72 01/24/2018   LDLCALC 141 (H)  01/24/2018   TRIG 114 01/24/2018   CHOLHDL 3.3 01/24/2018  She had labs done through work in the Fall, but didn't bring results today.  H/o Elevated LFT's--abdominal US was done 01/2017 showing "question mild fatty infiltration of the liver."  LFT's have been monitored regularly.  Lab Results  Component Value Date   ALT 45 (H) 01/03/2019   AST 31 01/03/2019   ALKPHOS 108 01/03/2019   BILITOT 0.9 01/03/2019   Vitamin D  deficiency--level was low at 26 in 12/2015, 35.6 last year, when had been getting 2800 IU total dose from her vitamins. She continues on the same vitamins.  Allergies: She has been using Flonase sporadically, just prn. Has had some PND recently, so used it yesterday. Last year took antihistamines last year, not currently  Cats sleep with her.  She was told 15 years ago she has a slight cat allergy.   She has h/o daily tension headaches at work. Did not get them as often when she was sick, but since feeling better, she realizes how behind she is at work, and had a stress headache yesterday.  Pain is at temples when she has headache.  She has some degenerative disease in her neck and back, but doing better, sees chiropractor regularly and sees Dr. Nelva Baldwin for pain management. He had changed her Tylenol #3 to Hydrocodone (norco) during her shingles pain.  She has been off all pain med for a few weeks.  Hasn't taken tylenol #3 recently either.  She gets Zoloft, BP meds and  Flonase for free from NP Amanda Baldwin at her job.   Immunization History  Administered Date(s) Administered  . Influenza Split 09/30/2011, 10/28/2012, 09/29/2013, 09/20/2014  . Influenza-Unspecified 09/22/2015  . Tdap 10/19/2011  . Zoster 12/09/2012   gets flu shots at work. Last Pap smear: UTD6/2017, through GYN (yearly visits, in September) Last mammogram:8/2019at [facility] Last colonoscopy: 10/2017, Dr. Watt Baldwin; showed diverticulosis and diminutive polyp--path showed polypoid colonic mucosa with lymphoid aggregate. He recommended 5 year f/u. Last DEXA: through GYN, reportedly normal (done twice, at [facility]). Dentist: twice yearly  Ophtho: yearly Exercise:recently started riding her exercise bike at home x 15 minutes, to ease back into routine.   Had [facility] labs in November 2019, didn't bring in results . Doesn't recall them.  09/2017 [facility] labs: c-met notable for fasting glucose 100, alk phos 143 (normal  up to 117), LDH 262 (normal up to 226), AST/ALT 54/88, GGT 325 (normal to 60). Lipids: TC 253, TG 97, HDL 69, LDL 165, chol/HDL ratio 3.7 TSH 0.995, and normal T4 and T3 uptake Normal CBC.  Past Medical History:  Diagnosis Date  . Allergic rhinitis, cause unspecified    seasonal  . C. difficile colitis 09/24/2017   treated with oral vancomycin per [facility].   Marland Kitchen Chronic constipation   . Chronic pain    f/b Dr. Ramos--multilevel cervical DDD, disk bulge L5-S1  . Colitis 08/29/2017   treated with Cipro and Flagyl. ( [facility])   . Depression    h/o hospitalization '87  . Elevated liver function tests    normal work-up; some fatty liver on u/s.  mild elevations intermittently  . Hemorrhoids 12/08   internal and external  . Hiatal hernia   . Hyperlipidemia 11/07   good HDL  . Hypertension   . Melanoma of thigh ([place]) 1999   R thigh, level 1 (Dr. Allyson Sabal)  . Plantar fasciitis, bilateral    L worse than R; Dr. Paulla Dolly (in  past)  . Reflux   . Vitamin D deficiency 2010    Past Surgical History:  Procedure Laterality Date  . [place]   Right breast--removal of benign tumor and benign cyst  . COLONOSCOPY  11/2007, 11/2012   Dr. Watt Baldwin  . DILATION AND CURETTAGE OF UTERUS  2006   bleeding (benign)  . ESOPHAGOGASTRODUODENOSCOPY  11/2012   Dr. Watt Baldwin  . excision of melanoma  1999   R thigh  . NASAL SINUS SURGERY  1995, 2002  . SHOULDER SURGERY  03-2011   Right; bone spur removal (Dr. Theda Sers)  . [place]   artificial disks in TMJ; Dr. Terence Lux    Social History   Socioeconomic History  . Marital status: Single    Spouse name: Not on file  . Number of children: 0  . Years of education: Not on file  . Highest education level: Not on file  Occupational History  . Occupation: pension Printmaker: [employer]  . Financial resource strain: Not on file  . Food insecurity:    Worry: Not on file    Inability:  Not on file  . Transportation needs:    Medical: Not on file    Non-medical: Not on file  Tobacco Use  . Smoking status: Never Smoker  . Smokeless tobacco: Never Used  Substance and Sexual Activity  . Alcohol use: Yes    Comment: 1 glass of wine per year.  . Drug use: No  . Sexual activity: Not Currently  Lifestyle  . Physical activity:    Days per week: Not on file    Minutes per session: Not on file  . Stress: Not on file  Relationships  . Social connections:    Talks on phone: Not on file    Gets together: Not on file    Attends religious service: Not on file    Active member of club or organization: Not on file    Attends meetings of clubs or organizations: Not on file    Relationship status: Not on file  . Intimate partner violence:    Fear of current or ex partner: Not on file    Emotionally abused: Not on file    Physically abused: Not on file    Forced sexual activity: Not on file  Other Topics Concern  . Not on file  Social History Narrative   Lives alone.  2 cats..  Works for [employer]    Family History  Problem Relation Age of Onset  . Hypertension Mother   . Allergies Mother   . Hyperlipidemia Mother   . Arthritis Mother   . Melanoma Mother   . Cancer Mother        uterine cancer and melanoma  . Heart disease Mother        atrial fibrillation  . Glaucoma Father   . Depression Father        (per daughter, never treated)  . Cancer Father        prostate cancer, diagnosed in 48's  . Colon polyps Father   . Glaucoma Sister   . Cancer Maternal Aunt        breast cancer in 40's  . Heart disease Maternal Aunt   . Heart disease Maternal Uncle   . Heart disease Maternal Uncle   . Stroke Maternal Grandmother   . Stroke Maternal Grandfather   . Diabetes Neg Hx  Outpatient Encounter Medications as of 01/26/2019  Medication Sig Note  . acetaminophen-codeine (TYLENOL #3) 300-30 MG per tablet Take 1 tablet by mouth every 6 (six) hours as needed for  moderate pain.  01/26/2019: Hasn't taken in a couple of weeks (prev 1 daily for chronic back pain)  . ascorbic acid (VITAMIN C) 500 MG tablet Take 1,000 mg by mouth daily.    . cholecalciferol (VITAMIN D) 1000 units tablet Take 1,000 Units by mouth daily.   Marland Kitchen dicyclomine (BENTYL) 10 MG capsule Take 10 mg by mouth 3 (three) times daily before meals.  01/26/2019: Used twice yesterday  . fluticasone (FLONASE) 50 MCG/ACT nasal spray Place 2 sprays into the nose daily as needed for allergies. Reported on 01/20/2016   . Lifitegrast (XIIDRA) 5 % SOLN Place 1 drop into both eyes 2 (two) times daily.    Marland Kitchen lisinopril (PRINIVIL,ZESTRIL) 20 MG tablet Take 20 mg by mouth daily.   . Magnesium 250 MG TABS Take 1 tablet by mouth every evening.    . Multiple Vitamin (MULTIVITAMIN) tablet Take 1 tablet by mouth daily.     Marland Kitchen omeprazole-sodium bicarbonate (ZEGERID) 40-1100 MG per capsule Take 1 capsule by mouth at bedtime.    . polyethylene glycol (MIRALAX / GLYCOLAX) packet Take 17 g by mouth daily as needed for mild constipation or moderate constipation. 01/26/2019: 1/2 capful every 1-2 days  . sertraline (ZOLOFT) 100 MG tablet TAKE 1 TABLET (100 MG TOTAL) BY MOUTH DAILY. (Patient taking differently: Take 100 mg by mouth daily. TAKE 1 TABLET (100 MG TOTAL) BY MOUTH DAILY.)   . ibuprofen (ADVIL,MOTRIN) 200 MG tablet Take 200-800 mg by mouth every 6 (six) hours as needed for headache or mild pain.   . methocarbamol (ROBAXIN) 500 MG tablet Take 500 mg by mouth every 8 (eight) hours as needed (neck pain). Reported on 01/20/2016   . simethicone (MYLICON) 517 MG chewable tablet Chew 125 mg by mouth every 6 (six) hours as needed for flatulence.   . [DISCONTINUED] dexlansoprazole (DEXILANT) 60 MG capsule Take 1 capsule (60 mg total) by mouth daily.   . [DISCONTINUED] gabapentin (NEURONTIN) 100 MG capsule Take as directed--1-2 pills in morning, 1-2 mid-day, and 3 at bedtime (Patient not taking: Reported on 01/02/2019) 01/02/2019: Had  been taking between 2-4 at night, not able to tolerate during the day.  . [DISCONTINUED] HYDROcodone-acetaminophen (NORCO) 10-325 MG tablet Take 1 tablet by mouth every 4 (four) hours as needed for moderate pain.    . [DISCONTINUED] LIDOCAINE EX Apply topically.   . [DISCONTINUED] methocarbamol (ROBAXIN) 750 MG tablet    . [DISCONTINUED] ondansetron (ZOFRAN ODT) 4 MG disintegrating tablet Take 1 tablet (4 mg total) by mouth every 8 (eight) hours as needed for nausea or vomiting.   . [DISCONTINUED] traMADol (ULTRAM) 50 MG tablet Take 1-2 tablets (50-100 mg total) by mouth every 6 (six) hours as needed for severe pain. (Patient not taking: Reported on 01/02/2019)    No facility-administered encounter medications on file as of 01/26/2019.     Allergies  Allergen Reactions  . Darvocet [Propoxyphene N-Acetaminophen] Other (See Comments)    Faint, blood pressure goes sky high.  . Etodolac Other (See Comments)    Red face, arms-looked like bad sunburn.  Sarina Ill [Bactrim] Hives    ROS: The patient denies anorexia, fever, vision changes, decreased hearing, ear pain, sore throat, breast concerns, chest pain, palpitations, syncope, dyspnea on exertion, cough, swelling, nausea, vomiting, diarrhea, melena, hematochezia, indigestion/heartburn, hematuria, incontinence, dysuria, vaginal bleeding,  discharge, odor or itch, genital lesions, numbness, tingling, weakness, suspicious skin lesions, anxiety, abnormal bleeding/bruising, or enlarged lymph nodes.  She sees dermatologist yearly for skin checks. + mild fatigue (unchanged). Slight hand tremor sometimes noted in the mornings--was much worse when she was sick, significantly improved. Depression is stable +tension headaches at work, per HPI Chronic back pain/arthritis Recent pain from shingles, resolved. Abdominal pain issues per HPI (reflux and bloating).  Diarrhea resolved.   PHYSICAL EXAM:  BP 124/82   Pulse 64   Ht '5\' 6"'$  (1.676 m)   Wt 171 lb  (77.6 kg)   BMI 27.60 kg/m   Wt Readings from Last 3 Encounters:  01/26/19 171 lb (77.6 kg)  01/03/19 166 lb (75.3 kg)  01/02/19 168 lb 6.4 oz (76.4 kg)    Declines changing into gown--sees GYN and dermatologist regularly.  General Appearance:  Alert, cooperative, no distress, appears stated age   Head:  Normocephalic, without obvious abnormality, atraumatic   Eyes:  PERRL, conjunctiva/corneas clear, EOM's intact, fundi benign   Ears:  Normal TM's and external ear canals   Nose:  Nares normal, mucosa is moderately edematous with clear mucus; nosinus tenderness   Throat:  Lips, mucosa, and tongue normal; teeth and gums normal   Neck:  Supple, no lymphadenopathy; thyroid: no enlargement/tenderness/nodules; no carotid bruit or JVD   Back:  Spine nontender, no curvature, ROM normal, no CVA tenderness   Lungs:  Clear to auscultation bilaterally without wheezes, rales or ronchi; respirations unlabored   Chest Wall:  No tenderness or deformity   Heart:  Regular rate and rhythm, S1 and S2 normal, no murmur, rub or gallop   Breast Exam:  Deferred to GYN   Abdomen:  Soft, non-tender, nondistended, normoactive bowel sounds, no masses,  no hepatosplenomegaly   Genitalia:  Deferred to GYN      Extremities:  No clubbing, cyanosis or edema;   Pulses:  2+ and symmetric all extremities   Skin:  Skin color, texture, turgor normal, no rashes or lesions.  Lymph nodes:  Cervical, supraclavicular, and axillary nodes normal   Neurologic:  CNII-XII intact, normal strength, sensation and gait; reflexes 2+ and symmetric throughout    Psych:  Normal mood, affect, hygiene and grooming   ASSESSMENT/PLAN:  Annual physical exam - Plan: POCT Urinalysis DIP (Proadvantage Device), Lipid panel, Glucose, random  Essential hypertension, benign - controlled; continue periodic monitoring elsewhere (work)  Elevated  LFTs  Pure hypercholesterolemia - lowfat, low cholesterol diet was reviewed - Plan: Lipid panel  Vitamin D deficiency - continue current supplements  Gastroesophageal reflux disease, esophagitis presence not specified - improved; consider using Dexilant (which she filled) just short-term for flares; to use Bentyl prn only (for abd pain/cramps)  Depression, major, in remission ([place])  Allergic rhinitis, unspecified seasonality, unspecified trigger    Discussed monthly self breast exams and yearly mammograms; at least 30 minutes of aerobic activity at least 5 days/week, weight-bearing exercise at least 2x/week; proper sunscreen use reviewed; healthy diet, including goals of calcium and vitamin D intake and alcohol recommendations (less than or equal to 1 drink/day) reviewed; regular seatbelt use; changing batteries in smoke detectors. Immunization recommendations discussed, UTD.Continue yearly flu shots. Shingrix recommended, discussed, may wait 6 mos due to recent shingles. Colonoscopy recommendations reviewed--UTD, due again 10/2022  Reviewed intent of Bentyl to be prn, not TID (which she had been working her way up to taking). She apparently got this from Dr. Watt Baldwin through phone call not at visit, so not really  discussed in detail.  Depression is well controlled. Counseled re: lowfat, low cholesterol diet in detail (and what changes she can make if it remains high); she will get copies of her [facility] labs from November to [place]. Hypertension is controlled (now that pain has resolved); counseled re: exercise recommendations and low sodium diet. Counseled re: tension headaches, meds, ensure not clenching/grinding teeth.  Counseled re: proper use of her allergy meds--recommended daily use of flonase, and antihistamine prn (vs restarting antihistamine daily, if preferred, and start the Flonase if not adequately controlling, during allergy season).   F/u 1 year, sooner prn.

## 2019-01-24 NOTE — Patient Instructions (Addendum)
HEALTH MAINTENANCE RECOMMENDATIONS:  It is recommended that you get at least 30 minutes of aerobic exercise at least 5 days/week (for weight loss, you may need as much as 60-90 minutes). This can be any activity that gets your heart rate up. This can be divided in 10-15 minute intervals if needed, but try and build up your endurance at least once a week.  Weight bearing exercise is also recommended twice weekly.  Eat a healthy diet with lots of vegetables, fruits and fiber.  "Colorful" foods have a lot of vitamins (ie green vegetables, tomatoes, red peppers, etc).  Limit sweet tea, regular sodas and alcoholic beverages, all of which has a lot of calories and sugar.  Up to 1 alcoholic drink daily may be beneficial for women (unless trying to lose weight, watch sugars).  Drink a lot of water.  Calcium recommendations are 1200-1500 mg daily (1500 mg for postmenopausal women or women without ovaries), and vitamin D 1000 IU daily.  This should be obtained from diet and/or supplements (vitamins), and calcium should not be taken all at once, but in divided doses.  Monthly self breast exams and yearly mammograms for women over the age of 4 is recommended.  Sunscreen of at least SPF 30 should be used on all sun-exposed parts of the skin when outside between the hours of 10 am and 4 pm (not just when at beach or pool, but even with exercise, golf, tennis, and yard work!)  Use a sunscreen that says "broad spectrum" so it covers both UVA and UVB rays, and make sure to reapply every 1-2 hours.  Remember to change the batteries in your smoke detectors when changing your clock times in the spring and fall.   Use your seat belt every time you are in a car, and please drive safely and not be distracted with cell phones and texting while driving.  It is okay to wait 6 months before getting your shingles vaccine, since you recently had the illness, which protects you temporarily.  Use the flonase daily, and use  claritin or zyrtec or allegra daily if needed in addition.     Fat and Cholesterol Restricted Eating Plan Getting too much fat and cholesterol in your diet may cause health problems. Choosing the right foods helps keep your fat and cholesterol at normal levels. This can keep you from getting certain diseases.  What are tips for following this plan? Meal planning  At meals, divide your plate into four equal parts: ? Fill one-half of your plate with vegetables and green salads. ? Fill one-fourth of your plate with whole grains. ? Fill one-fourth of your plate with low-fat (lean) protein foods.  Eat fish that is high in omega-3 fats at least two times a week. This includes mackerel, tuna, sardines, and salmon.  Eat foods that are high in fiber, such as whole grains, beans, apples, broccoli, carrots, peas, and barley. General tips   Work with your doctor to lose weight if you need to.  Avoid: ? Foods with added sugar. ? Fried foods. ? Foods with partially hydrogenated oils.  Limit alcohol intake to no more than 1 drink a day for nonpregnant women and 2 drinks a day for men. One drink equals 12 oz of beer, 5 oz of wine, or 1 oz of hard liquor. Reading food labels  Check food labels for: ? Trans fats. ? Partially hydrogenated oils. ? Saturated fat (g) in each serving. ? Cholesterol (mg) in each serving. ? Fiber (  g) in each serving.  Choose foods with healthy fats, such as: ? Monounsaturated fats. ? Polyunsaturated fats. ? Omega-3 fats.  Choose grain products that have whole grains. Look for the word "whole" as the first word in the ingredient list. Cooking  Cook foods using low-fat methods. These include baking, boiling, grilling, and broiling.  Eat more home-cooked foods. Eat at restaurants and buffets less often.  Avoid cooking using saturated fats, such as butter, cream, palm oil, palm kernel oil, and coconut oil. Recommended foods  Fruits  All fresh, canned (in  natural juice), or frozen fruits. Vegetables  Fresh or frozen vegetables (raw, steamed, roasted, or grilled). Green salads. Grains  Whole grains, such as whole wheat or whole grain breads, crackers, cereals, and pasta. Unsweetened oatmeal, bulgur, barley, quinoa, or brown rice. Corn or whole wheat flour tortillas. Meats and other protein foods  Ground beef (85% or leaner), grass-fed beef, or beef trimmed of fat. Skinless chicken or [place]. Ground chicken or [place]. Pork trimmed of fat. All fish and seafood. Egg whites. Dried beans, peas, or lentils. Unsalted nuts or seeds. Unsalted canned beans. Nut butters without added sugar or oil. Dairy  Low-fat or nonfat dairy products, such as skim or 1% milk, 2% or reduced-fat cheeses, low-fat and fat-free ricotta or cottage cheese, or plain low-fat and nonfat yogurt. Fats and oils  Tub margarine without trans fats. Light or reduced-fat mayonnaise and salad dressings. Avocado. Olive, canola, sesame, or safflower oils. The items listed above may not be a complete list of foods and beverages you can eat. Contact a dietitian for more information. Foods to avoid Fruits  Canned fruit in heavy syrup. Fruit in cream or butter sauce. Fried fruit. Vegetables  Vegetables cooked in cheese, cream, or butter sauce. Fried vegetables. Grains  White bread. White pasta. White rice. Cornbread. Bagels, pastries, and croissants. Crackers and snack foods that contain trans fat and hydrogenated oils. Meats and other protein foods  Fatty cuts of meat. Ribs, chicken wings, bacon, sausage, bologna, salami, chitterlings, fatback, hot dogs, bratwurst, and packaged lunch meats. Liver and organ meats. Whole eggs and egg yolks. Chicken and [place] with skin. Fried meat. Dairy  Whole or 2% milk, cream, half-and-half, and cream cheese. Whole milk cheeses. Whole-fat or sweetened yogurt. Full-fat cheeses. Nondairy creamers and whipped toppings. Processed cheese, cheese spreads,  and cheese curds. Beverages  Alcohol. Sugar-sweetened drinks such as sodas, lemonade, and fruit drinks. Fats and oils  Butter, stick margarine, lard, shortening, ghee, or bacon fat. Coconut, palm kernel, and palm oils. Sweets and desserts  Corn syrup, sugars, honey, and molasses. Candy. Jam and jelly. Syrup. Sweetened cereals. Cookies, pies, cakes, donuts, muffins, and ice cream. The items listed above may not be a complete list of foods and beverages you should avoid. Contact a dietitian for more information. Summary  Choosing the right foods helps keep your fat and cholesterol at normal levels. This can keep you from getting certain diseases.  At meals, fill one-half of your plate with vegetables and green salads.  Eat high-fiber foods, like whole grains, beans, apples, carrots, peas, and barley.  Limit added sugar, saturated fats, alcohol, and fried foods. This information is not intended to replace advice given to you by your health care provider. Make sure you discuss any questions you have with your health care provider. Document Released: 06/07/2012 Document Revised: 08/10/2018 Document Reviewed: 08/24/2017 Elsevier Interactive Patient Education  2019 [employer].

## 2019-01-26 ENCOUNTER — Ambulatory Visit (INDEPENDENT_AMBULATORY_CARE_PROVIDER_SITE_OTHER): Payer: BLUE CROSS/BLUE SHIELD | Admitting: Family Medicine

## 2019-01-26 ENCOUNTER — Encounter: Payer: Self-pay | Admitting: Family Medicine

## 2019-01-26 VITALS — BP 124/82 | HR 64 | Ht 66.0 in | Wt 171.0 lb

## 2019-01-26 DIAGNOSIS — J309 Allergic rhinitis, unspecified: Secondary | ICD-10-CM

## 2019-01-26 DIAGNOSIS — R945 Abnormal results of liver function studies: Secondary | ICD-10-CM

## 2019-01-26 DIAGNOSIS — E559 Vitamin D deficiency, unspecified: Secondary | ICD-10-CM | POA: Diagnosis not present

## 2019-01-26 DIAGNOSIS — E7800 Pure hypercholesterolemia, unspecified: Secondary | ICD-10-CM

## 2019-01-26 DIAGNOSIS — F325 Major depressive disorder, single episode, in full remission: Secondary | ICD-10-CM

## 2019-01-26 DIAGNOSIS — R7989 Other specified abnormal findings of blood chemistry: Secondary | ICD-10-CM

## 2019-01-26 DIAGNOSIS — K219 Gastro-esophageal reflux disease without esophagitis: Secondary | ICD-10-CM

## 2019-01-26 DIAGNOSIS — Z0000 Encounter for general adult medical examination without abnormal findings: Secondary | ICD-10-CM

## 2019-01-26 DIAGNOSIS — I10 Essential (primary) hypertension: Secondary | ICD-10-CM

## 2019-01-26 LAB — LIPID PANEL
Chol/HDL Ratio: 3 ratio (ref 0.0–4.4)
Cholesterol, Total: 211 mg/dL — ABNORMAL HIGH (ref 100–199)
HDL: 70 mg/dL (ref >39)
LDL Calculated: 124 mg/dL — ABNORMAL HIGH (ref 0–99)
Triglycerides: 84 mg/dL (ref 0–149)
VLDL Cholesterol Cal: 17 mg/dL (ref 5–40)

## 2019-01-26 LAB — POCT URINALYSIS DIP (PROADVANTAGE DEVICE)
Bilirubin, UA: NEGATIVE
Glucose, UA: NEGATIVE mg/dL
Ketones, POC UA: NEGATIVE mg/dL
Leukocytes, UA: NEGATIVE
Nitrite, UA: NEGATIVE
PROTEIN UA: NEGATIVE mg/dL
RBC UA: NEGATIVE
Specific Gravity, Urine: 1.01
Urobilinogen, Ur: NEGATIVE
pH, UA: 6.5 (ref 5.0–8.0)

## 2019-01-26 LAB — GLUCOSE, RANDOM: Glucose: 91 mg/dL (ref 65–99)

## 2019-01-27 ENCOUNTER — Other Ambulatory Visit: Payer: Self-pay | Admitting: Family Medicine

## 2019-01-27 ENCOUNTER — Encounter: Payer: Self-pay | Admitting: Family Medicine

## 2019-01-27 NOTE — Telephone Encounter (Signed)
Is there any way to get this attachment into her lab section?? (if she had faxed or mailed it, I would have had it scanned into labs; not sure how to do that from a MyChart Message attachment, hoping you can find someone who does! Thanks

## 2019-01-27 NOTE — Telephone Encounter (Signed)
Pt was called to ask if she is still taking dexilant. No answer lvm. kh

## 2019-01-31 DIAGNOSIS — F418 Other specified anxiety disorders: Secondary | ICD-10-CM | POA: Diagnosis not present

## 2019-01-31 DIAGNOSIS — J3089 Other allergic rhinitis: Secondary | ICD-10-CM | POA: Diagnosis not present

## 2019-07-28 ENCOUNTER — Other Ambulatory Visit: Payer: BLUE CROSS/BLUE SHIELD

## 2019-07-28 ENCOUNTER — Other Ambulatory Visit (INDEPENDENT_AMBULATORY_CARE_PROVIDER_SITE_OTHER): Payer: BLUE CROSS/BLUE SHIELD

## 2019-07-28 ENCOUNTER — Other Ambulatory Visit: Payer: Self-pay

## 2019-07-28 DIAGNOSIS — Z23 Encounter for immunization: Secondary | ICD-10-CM

## 2019-08-04 DIAGNOSIS — Z760 Encounter for issue of repeat prescription: Secondary | ICD-10-CM | POA: Diagnosis not present

## 2019-08-04 DIAGNOSIS — F418 Other specified anxiety disorders: Secondary | ICD-10-CM | POA: Diagnosis not present

## 2019-08-04 DIAGNOSIS — I10 Essential (primary) hypertension: Secondary | ICD-10-CM | POA: Diagnosis not present

## 2019-08-04 DIAGNOSIS — J3089 Other allergic rhinitis: Secondary | ICD-10-CM | POA: Diagnosis not present

## 2019-09-09 DIAGNOSIS — Z1231 Encounter for screening mammogram for malignant neoplasm of breast: Secondary | ICD-10-CM | POA: Diagnosis not present

## 2019-09-09 LAB — HM MAMMOGRAPHY

## 2019-09-13 ENCOUNTER — Encounter: Payer: Self-pay | Admitting: *Deleted

## 2019-11-02 DIAGNOSIS — Z01419 Encounter for gynecological examination (general) (routine) without abnormal findings: Secondary | ICD-10-CM | POA: Diagnosis not present

## 2019-11-02 DIAGNOSIS — Z6829 Body mass index (BMI) 29.0-29.9, adult: Secondary | ICD-10-CM | POA: Diagnosis not present

## 2019-11-03 DIAGNOSIS — F418 Other specified anxiety disorders: Secondary | ICD-10-CM | POA: Diagnosis not present

## 2019-11-03 DIAGNOSIS — I10 Essential (primary) hypertension: Secondary | ICD-10-CM | POA: Diagnosis not present

## 2019-11-03 DIAGNOSIS — Z760 Encounter for issue of repeat prescription: Secondary | ICD-10-CM | POA: Diagnosis not present

## 2019-11-03 DIAGNOSIS — J3089 Other allergic rhinitis: Secondary | ICD-10-CM | POA: Diagnosis not present

## 2019-11-06 DIAGNOSIS — L821 Other seborrheic keratosis: Secondary | ICD-10-CM | POA: Diagnosis not present

## 2019-11-06 DIAGNOSIS — D225 Melanocytic nevi of trunk: Secondary | ICD-10-CM | POA: Diagnosis not present

## 2019-11-06 DIAGNOSIS — L858 Other specified epidermal thickening: Secondary | ICD-10-CM | POA: Diagnosis not present

## 2019-11-06 DIAGNOSIS — L814 Other melanin hyperpigmentation: Secondary | ICD-10-CM | POA: Diagnosis not present

## 2019-11-27 ENCOUNTER — Other Ambulatory Visit (INDEPENDENT_AMBULATORY_CARE_PROVIDER_SITE_OTHER): Payer: BC Managed Care – PPO

## 2019-11-27 ENCOUNTER — Other Ambulatory Visit: Payer: Self-pay

## 2019-11-27 DIAGNOSIS — Z23 Encounter for immunization: Secondary | ICD-10-CM

## 2019-12-06 DIAGNOSIS — M25561 Pain in right knee: Secondary | ICD-10-CM | POA: Diagnosis not present

## 2019-12-06 DIAGNOSIS — M1712 Unilateral primary osteoarthritis, left knee: Secondary | ICD-10-CM | POA: Diagnosis not present

## 2019-12-06 DIAGNOSIS — M1711 Unilateral primary osteoarthritis, right knee: Secondary | ICD-10-CM | POA: Diagnosis not present

## 2019-12-06 DIAGNOSIS — M25512 Pain in left shoulder: Secondary | ICD-10-CM | POA: Diagnosis not present

## 2019-12-06 DIAGNOSIS — S46002A Unspecified injury of muscle(s) and tendon(s) of the rotator cuff of left shoulder, initial encounter: Secondary | ICD-10-CM | POA: Diagnosis not present

## 2019-12-06 DIAGNOSIS — M170 Bilateral primary osteoarthritis of knee: Secondary | ICD-10-CM | POA: Diagnosis not present

## 2019-12-11 DIAGNOSIS — G894 Chronic pain syndrome: Secondary | ICD-10-CM | POA: Diagnosis not present

## 2019-12-11 DIAGNOSIS — R52 Pain, unspecified: Secondary | ICD-10-CM | POA: Diagnosis not present

## 2019-12-11 DIAGNOSIS — Z79899 Other long term (current) drug therapy: Secondary | ICD-10-CM | POA: Diagnosis not present

## 2019-12-26 DIAGNOSIS — Z79899 Other long term (current) drug therapy: Secondary | ICD-10-CM | POA: Diagnosis not present

## 2020-01-13 ENCOUNTER — Ambulatory Visit: Payer: BC Managed Care – PPO | Attending: Internal Medicine

## 2020-01-13 DIAGNOSIS — Z23 Encounter for immunization: Secondary | ICD-10-CM | POA: Insufficient documentation

## 2020-01-13 NOTE — Progress Notes (Signed)
   Covid-19 Vaccination Clinic  Name:  Amanda Baldwin    MRN: [id number] DOB: 10/31/1956  01/13/2020  [name] was observed post Covid-19 immunization for 15 minutes without incidence. She was provided with Vaccine Information Sheet and instruction to access the V-Safe system.   [name] was instructed to call 911 with any severe reactions post vaccine: Marland Kitchen Difficulty breathing  . Swelling of your face and throat  . A fast heartbeat  . A bad rash all over your body  . Dizziness and weakness    Immunizations Administered    Name Date Dose VIS Date Route   Pfizer COVID-19 Vaccine 01/13/2020  2:33 PM 0.3 mL 12/01/2019 Intramuscular   Manufacturer: [place]   Lot: [id number]   [place]: [id number]

## 2020-01-29 NOTE — Patient Instructions (Addendum)
  HEALTH MAINTENANCE RECOMMENDATIONS:  It is recommended that you get at least 30 minutes of aerobic exercise at least 5 days/week (for weight loss, you may need as much as 60-90 minutes). This can be any activity that gets your heart rate up. This can be divided in 10-15 minute intervals if needed, but try and build up your endurance at least once a week.  Weight bearing exercise is also recommended twice weekly.  Eat a healthy diet with lots of vegetables, fruits and fiber.  "Colorful" foods have a lot of vitamins (ie green vegetables, tomatoes, red peppers, etc).  Limit sweet tea, regular sodas and alcoholic beverages, all of which has a lot of calories and sugar.  Up to 1 alcoholic drink daily may be beneficial for women (unless trying to lose weight, watch sugars).  Drink a lot of water.  Calcium recommendations are 1200-1500 mg daily (1500 mg for postmenopausal women or women without ovaries), and vitamin D 1000 IU daily.  This should be obtained from diet and/or supplements (vitamins), and calcium should not be taken all at once, but in divided doses.  Monthly self breast exams and yearly mammograms for women over the age of 2 is recommended.  Sunscreen of at least SPF 30 should be used on all sun-exposed parts of the skin when outside between the hours of 10 am and 4 pm (not just when at beach or pool, but even with exercise, golf, tennis, and yard work!)  Use a sunscreen that says "broad spectrum" so it covers both UVA and UVB rays, and make sure to reapply every 1-2 hours.  Remember to change the batteries in your smoke detectors when changing your clock times in the spring and fall. Carbon monoxide detectors are recommended for your home.  Use your seat belt every time you are in a car, and please drive safely and not be distracted with cell phones and texting while driving.  You are due for a Prevnar-13 (the first of two pneumonia vaccines, now that you are 58).  We need to wait at least  2 weeks after your second COVID vaccine before you can get this.  Schedule a nurse visit for March to get this vaccine (unless you can get through work).  Consider trying a tylenol product (extra strength, Arthritis) in the afternoon in place of the advil, if needed for pain.  If you get another bone density test through your GYN at [facility], please ask them to also send me a copy (as they do the mammograms).  We discussed cutting back on sweets, and getting back on the exercise bike (see above for exercise recommendations).

## 2020-01-29 NOTE — Progress Notes (Signed)
Chief Complaint  Patient presents with  . Annual Exam    fasting annual exam no pap-no concerns.     Amanda Baldwin is a 64 y.o. female who presents for a complete physical.  She has the following concerns:  HTN--Blood pressures haven't been checked regularly at home.  She BP's she has recorded in her phone--11/02/2019 140/82 at GYN, 01/2019 120/72 at health clinic; not check at work health clinic since then as visits have been virtual. She has been taking Lisinopril 20mg  since 2018. She has just a slight cough (likely from postnasal drainage, chronic). Cough is minor, intermittent.    She was noted to have asymmetry of kidneys on 01/2017 abdominal US. She was sent for Duplex which didn't show significant blockage (1-59% bilaterally). Dr. Donnetta Hutching had said to consider CT angiogram (if kidney function remains normal) only if she has persistent difficult to control hypertension (which she hasn't demonstrated).  Denies headaches, dizziness, chest pain, palpitations. Only occasional vertigo, "seasick" and vomiting (last occurred a month ago), treated by chiropractor.  She had echo back in 2013, when she was having significant fatigue. Notable for moderate diastolic dysfunction,EF [id number].  Energy has improved since then. No known snoring. Doesn't fall asleep while driving.   Depression: Doing well on Zoloft, moods good.She is taking 100mg  and is doing well. Denies any worsening of depression related to the pandemic or anything else.  +work stress, working too many hours, sometimes working through the weekends.  Admits to stress eating and gaining weight.  Hyperlipidemia--previously took meds, but has been off lipid-lowering meds for years, and numbers have been okay. Has been following low cholesterol diet.[facility] labs showed much higher cholesterol in October, 2018.  Rechecked 01/2018 and LDL 141, HDL 72.  At that time her diet consisted of red meat 2-3x/week, some cheese, skim, eats 2 hard  boiled egg whites daily.  Last check 1 year ago, she reported eating 1 fried egg daily (7/week) rather than the egg whiles.  Lab Results  Component Value Date   CHOL 211 (H) 01/26/2019   HDL 70 01/26/2019   LDLCALC 124 (H) 01/26/2019   TRIG 84 01/26/2019   CHOLHDL 3.0 01/26/2019   Current diet: doesn't eat creamy foods/soups.  Continues to eat 1 fried egg daily.  Red meat once or twice/week.  Uses ground [place] for her chili. Some [place] and cream cheese sandwiches for lunch since working at home.  She has gained weight.  She reports that her downfall is sugar and sweets. She does eat when stressed, and has had a lot of work stress.  She worked 59 hours last week. 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 Doesn't mind working long days, but doesn't like having to work through the weekend.  Plans to work for another year.  H/o Elevated LFT's--abdominal US was done 01/2017 showing "question mild fatty infiltration of the liver." LFT's have been monitored regularly.   Lab Results  Component Value Date   ALT 45 (H) 01/03/2019   AST 31 01/03/2019   ALKPHOS 108 01/03/2019   BILITOT 0.9 01/03/2019   Vitamin D deficiency--level was low at 26in 12/2015, 35.6 on f/u after that, when had been getting 2800 IU total dose from her vitamins. She continues on the same vitamins.  Allergies: She has been using Flonase sporadically, just prn. Uses antihistamines prn.  She is using saline spray regularly, which is helpful. Cats sleep with her. She was told 15 years ago she has a  slight cat allergy.   She has h/o daily tension headaches at work. Pain is at temples when she has headache.  She reports these are better, not as many since working at home.  She has some degenerative disease in her neck and back, but doing better, sees chiropractor regularly and sees Dr. Nelva Bush for pain management. He had changed her Tylenol #3 to Hydrocodone (norco) during her shingles pain last year.   She is back to taking tylenol #3, one daily, and advil in the afternoon (2 of them), most days.  Hasn't tried taking tylenol in the afternoon (told to avoid tylenol because of liver in the past). She is prescribed #120 every 4 months from Dr. Nelva Bush, last 11/21/2019.  Last year she was seeing Dr. Watt Climes with abdominal pain, bloating, diarrhea.  Had been given Bentyl.  It was very helpful, only needed a few tablets. Has only needed 1 tablet in the last year. No longer having any GI issues.  She gets Zoloft, lisinopril, Allegra and Flonase for free from NP [name] at her job.  She believes she has a living will and healthcare power of attorney, but she plans to look at it again to see if anything needs to be changed.  Immunization History  Administered Date(s) Administered  . Influenza Split 09/30/2011, 10/28/2012, 09/29/2013, 09/20/2014  . Influenza,inj,Quad PF,6+ Mos 10/07/2019  . Influenza-Unspecified 09/22/2015  . PFIZER SARS-COV-2 Vaccination 01/13/2020  . Tdap 10/19/2011  . Zoster 12/09/2012  . Zoster Recombinat (Shingrix) 07/28/2019, 11/27/2019   Last Pap smear: UTD6/2017, through GYN (yearly visits, in September) Last mammogram:08/2019 Last colonoscopy:10/2017, Dr. Watt Climes; showed diverticulosis and diminutive polyp--path showed polypoid colonic mucosa with lymphoid aggregate. He recommended 5 year f/u. Last DEXA: through GYN, reportedly normal (done twice, at [facility]). Discussed possibly getting again at next visit. Dentist: twice yearly  Ophtho: yearly Exercise:  Nothing regular.  Has exercise bike, hasn't been using it.    PMH, PSH ,SH and FH reviewed and updated  Outpatient Encounter Medications as of 01/31/2020  Medication Sig Note  . acetaminophen-codeine (TYLENOL #3) 300-30 MG per tablet Take 1 tablet by mouth every 6 (six) hours as needed for moderate pain.  01/31/2020: Takes 1/day  . ascorbic acid (VITAMIN C) 500 MG tablet Take 1,000 mg by mouth daily.    .  cholecalciferol (VITAMIN D) 1000 units tablet Take 1,000 Units by mouth daily.   Marland Kitchen Lifitegrast (XIIDRA) 5 % SOLN Place 1 drop into both eyes 2 (two) times daily.    Marland Kitchen lisinopril (PRINIVIL,ZESTRIL) 20 MG tablet Take 20 mg by mouth daily.   . Magnesium 250 MG TABS Take 1 tablet by mouth every evening.    . Multiple Vitamin (MULTIVITAMIN) tablet Take 1 tablet by mouth daily.     Marland Kitchen omeprazole-sodium bicarbonate (ZEGERID) 40-1100 MG per capsule Take 1 capsule by mouth at bedtime.    . polyethylene glycol (MIRALAX / GLYCOLAX) packet Take 17 g by mouth daily as needed for mild constipation or moderate constipation. 01/31/2020: As needed  . sertraline (ZOLOFT) 100 MG tablet TAKE 1 TABLET (100 MG TOTAL) BY MOUTH DAILY. (Patient taking differently: Take 100 mg by mouth daily. TAKE 1 TABLET (100 MG TOTAL) BY MOUTH DAILY.)   . dicyclomine (BENTYL) 10 MG capsule Take 10 mg by mouth 3 (three) times daily before meals.  01/31/2020: Rarely needs  . fexofenadine (ALLEGRA) 180 MG tablet Take 180 mg by mouth daily.   . fluticasone (FLONASE) 50 MCG/ACT nasal spray Place 2 sprays into  the nose daily as needed for allergies. Reported on 01/20/2016   . ibuprofen (ADVIL,MOTRIN) 200 MG tablet Take 200-800 mg by mouth every 6 (six) hours as needed for headache or mild pain.   . methocarbamol (ROBAXIN) 500 MG tablet Take 500 mg by mouth every 8 (eight) hours as needed (neck pain). Reported on 01/20/2016   . simethicone (MYLICON) 0000000 MG chewable tablet Chew 125 mg by mouth every 6 (six) hours as needed for flatulence.    No facility-administered encounter medications on file as of 01/31/2020.   Allergies  Allergen Reactions  . Darvocet [Propoxyphene N-Acetaminophen] Other (See Comments)    Faint, blood pressure goes sky high.  . Etodolac Other (See Comments)    Red face, arms-looked like bad sunburn.  Sarina Ill [Bactrim] Hives    ROS: The patient denies anorexia, fever, vision changes, decreased hearing, ear pain, sore  throat, breast concerns, chest pain, palpitations, syncope, dyspnea on exertion, cough, swelling, nausea, vomiting, diarrhea, melena, hematochezia, indigestion/heartburn, hematuria, incontinence, dysuria, vaginal bleeding, discharge, odor or itch, genital lesions, numbness, tingling, weakness, suspicious skin lesions, anxiety, abnormal bleeding/bruising, or enlarged lymph nodes.  She sees dermatologist yearly for skin checks. Slight hand tremor sometimes noted in the mornings (intermittent, unchanged); wonders if she had a stroke in late 20's/30 when stressed and BP high;  Memory changed after that, hasn't progressed since. Depressionis stable, + work stress. +tension headaches at work, per HPI, less. Chronic back pain/arthritis. Occasional bloating and gas (known HH/GERD); bowel are normal. Weight gain--13# in the last year   PHYSICAL EXAM:  BP 124/72   Pulse 68   Temp (!) 96.4 F (35.8 C) (Other (Comment))   Ht 5\' 6"  (1.676 m)   Wt 184 lb 3.2 oz (83.6 kg)   BMI 29.73 kg/m   Wt Readings from Last 3 Encounters:  01/31/20 184 lb 3.2 oz (83.6 kg)  01/26/19 171 lb (77.6 kg)  01/03/19 166 lb (75.3 kg)    Declines changing into gown--sees GYN and dermatologist regularly.  General Appearance:  Alert, cooperative, no distress, appears stated age   Head:  Normocephalic, without obvious abnormality, atraumatic   Eyes:  PERRL, conjunctiva/corneas clear, EOM's intact, fundi benign   Ears:  Normal TM's and external ear canals   Nose:  Not examined, wearing mask due to COVID-19 pandemic  Throat:  Not examined, wearing mask due to COVID-19 pandemic  Neck:  Supple, no lymphadenopathy; thyroid: no enlargement/ tenderness/nodules; no carotid bruit or JVD   Back:  Spine nontender, no curvature, ROM normal, no CVA tenderness   Lungs:  Clear to auscultation bilaterally without wheezes, rales or ronchi; respirations unlabored   Chest Wall:  No tenderness or  deformity   Heart:  Regular rate and rhythm, S1 and S2 normal, no murmur, rub or gallop   Breast Exam:  Deferred to GYN   Abdomen:  Soft, non-tender, nondistended, normoactive bowel sounds, no masses, no hepatosplenomegaly   Genitalia:  Deferred to GYN      Extremities:  No clubbing, cyanosis or edema;   Pulses:  2+ and symmetric all extremities   Skin:  Skin color, texture, turgor normal, no rashes or lesions.Limited exam (chose not to get into gown, sees derm)  Lymph nodes:  Cervical, supraclavicular, and axillary nodes normal   Neurologic:  CNII-XII intact, normal strength, sensation and gait; reflexes 2+ and symmetric throughout    Psych: Normal mood, affect, hygiene and grooming   Normal urine dip  ASSESSMENT/PLAN:   Annual physical exam -  Plan: POCT Urinalysis DIP (Proadvantage Device), CBC with Differential/Platelet, Comprehensive metabolic panel, Lipid panel, TSH, VITAMIN D 25 Hydroxy (Vit-D Deficiency, Fractures)  Essential hypertension, benign - well controlled; encouraged daily exercise and wt loss. cont lisinopril - Plan: Comprehensive metabolic panel  Pure hypercholesterolemia - reviewed low cholesterol diet - Plan: Lipid panel  Vitamin D deficiency - continue supplements - Plan: VITAMIN D 25 Hydroxy (Vit-D Deficiency, Fractures)  Depression, major, in remission (HCC)  Allergic rhinitis, unspecified seasonality, unspecified trigger  Weight gain - counseled re: diet and exercise, and risks of further weight gain - Plan: TSH  Medication monitoring encounter - Plan: CBC with Differential/Platelet, Comprehensive metabolic panel  Work-related stress - counseled (regarding short-term stress--deadline with new system--vs long-term). reviewed stress reduction techniques   COVID vaccine She got first dose 1/23, 2nd is scheduled for 2/14.  Prevnar-13 due--must wait 2 weeks after her second COVID dose--schedule [place]  for early March vs getting at health clinic if available there (and let us know the date).   Discussed monthly self breast exams and yearly mammograms; at least 30 minutes of aerobic activity at least 5 days/week, weight-bearing exercise at least 2x/week; proper sunscreen use reviewed; healthy diet, including goals of calcium and vitamin D intake and alcohol recommendations (less than or equal to 1 drink/day) reviewed; regular seatbelt use; changing batteries in smoke detectors. Immunization recommendations discussed, Prevnar-13 due, but must wait until after completion of the COVID series.Continue yearly flu shots. Tetanus booster next year. Colonoscopy recommendations reviewed--UTD, due again 10/2022  MOST form completed.  Full Code, Full care Living will and healthcare POA paperwork given.  F/u 1 year, sooner prn.

## 2020-01-31 ENCOUNTER — Ambulatory Visit: Payer: BC Managed Care – PPO | Admitting: Family Medicine

## 2020-01-31 ENCOUNTER — Encounter: Payer: Self-pay | Admitting: Family Medicine

## 2020-01-31 ENCOUNTER — Other Ambulatory Visit: Payer: Self-pay

## 2020-01-31 VITALS — BP 124/72 | HR 68 | Temp 96.4°F | Ht 66.0 in | Wt 184.2 lb

## 2020-01-31 DIAGNOSIS — I10 Essential (primary) hypertension: Secondary | ICD-10-CM

## 2020-01-31 DIAGNOSIS — E7800 Pure hypercholesterolemia, unspecified: Secondary | ICD-10-CM | POA: Diagnosis not present

## 2020-01-31 DIAGNOSIS — Z5181 Encounter for therapeutic drug level monitoring: Secondary | ICD-10-CM | POA: Diagnosis not present

## 2020-01-31 DIAGNOSIS — J309 Allergic rhinitis, unspecified: Secondary | ICD-10-CM

## 2020-01-31 DIAGNOSIS — E559 Vitamin D deficiency, unspecified: Secondary | ICD-10-CM | POA: Diagnosis not present

## 2020-01-31 DIAGNOSIS — Z0000 Encounter for general adult medical examination without abnormal findings: Secondary | ICD-10-CM

## 2020-01-31 DIAGNOSIS — F325 Major depressive disorder, single episode, in full remission: Secondary | ICD-10-CM | POA: Diagnosis not present

## 2020-01-31 DIAGNOSIS — R635 Abnormal weight gain: Secondary | ICD-10-CM

## 2020-01-31 DIAGNOSIS — Z566 Other physical and mental strain related to work: Secondary | ICD-10-CM

## 2020-01-31 LAB — POCT URINALYSIS DIP (PROADVANTAGE DEVICE)
Bilirubin, UA: NEGATIVE
Blood, UA: NEGATIVE
Glucose, UA: NEGATIVE mg/dL
Ketones, POC UA: NEGATIVE mg/dL
Leukocytes, UA: NEGATIVE
Nitrite, UA: NEGATIVE
Protein Ur, POC: NEGATIVE mg/dL
Specific Gravity, Urine: 1.01
Urobilinogen, Ur: NEGATIVE
pH, UA: 6 (ref 5.0–8.0)

## 2020-02-01 LAB — COMPREHENSIVE METABOLIC PANEL
ALT: 40 IU/L — ABNORMAL HIGH (ref 0–32)
AST: 34 IU/L (ref 0–40)
Albumin/Globulin Ratio: 2.1 (ref 1.2–2.2)
Albumin: 4.9 g/dL — ABNORMAL HIGH (ref 3.8–4.8)
Alkaline Phosphatase: 134 IU/L — ABNORMAL HIGH (ref 39–117)
BUN/Creatinine Ratio: 15 (ref 12–28)
BUN: 11 mg/dL (ref 8–27)
Bilirubin Total: 0.2 mg/dL (ref 0.0–1.2)
CO2: 20 mmol/L (ref 20–29)
Calcium: 9.8 mg/dL (ref 8.7–10.3)
Chloride: 102 mmol/L (ref 96–106)
Creatinine, Ser: 0.73 mg/dL (ref 0.57–1.00)
GFR calc Af Amer: 100 mL/min/{1.73_m2} (ref >59)
GFR calc non Af Amer: 87 mL/min/{1.73_m2} (ref >59)
Globulin, Total: 2.3 g/dL (ref 1.5–4.5)
Glucose: 95 mg/dL (ref 65–99)
Potassium: 4.4 mmol/L (ref 3.5–5.2)
Sodium: 139 mmol/L (ref 134–144)
Total Protein: 7.2 g/dL (ref 6.0–8.5)

## 2020-02-01 LAB — LIPID PANEL
Chol/HDL Ratio: 3.3 ratio (ref 0.0–4.4)
Cholesterol, Total: 243 mg/dL — ABNORMAL HIGH (ref 100–199)
HDL: 74 mg/dL (ref >39)
LDL Chol Calc (NIH): 151 mg/dL — ABNORMAL HIGH (ref 0–99)
Triglycerides: 102 mg/dL (ref 0–149)
VLDL Cholesterol Cal: 18 mg/dL (ref 5–40)

## 2020-02-01 LAB — CBC WITH DIFFERENTIAL/PLATELET
Basophils Absolute: 0.1 10*3/uL (ref 0.0–0.2)
Basos: 1 %
EOS (ABSOLUTE): 0.2 10*3/uL (ref 0.0–0.4)
Eos: 4 %
Hematocrit: 39.3 % (ref 34.0–46.6)
Hemoglobin: 14 g/dL (ref 11.1–15.9)
Immature Grans (Abs): 0 10*3/uL (ref 0.0–0.1)
Immature Granulocytes: 0 %
Lymphocytes Absolute: 1.5 10*3/uL (ref 0.7–3.1)
Lymphs: 32 %
MCH: 32.3 pg (ref 26.6–33.0)
MCHC: 35.6 g/dL (ref 31.5–35.7)
MCV: 91 fL (ref 79–97)
Monocytes Absolute: 0.3 10*3/uL (ref 0.1–0.9)
Monocytes: 7 %
Neutrophils Absolute: 2.5 10*3/uL (ref 1.4–7.0)
Neutrophils: 56 %
Platelets: 308 10*3/uL (ref 150–450)
RBC: 4.34 x10E6/uL (ref 3.77–5.28)
RDW: 12.1 % (ref 11.7–15.4)
WBC: 4.5 10*3/uL (ref 3.4–10.8)

## 2020-02-01 LAB — TSH: TSH: 1.1 u[IU]/mL (ref 0.450–4.500)

## 2020-02-01 LAB — VITAMIN D 25 HYDROXY (VIT D DEFICIENCY, FRACTURES): Vit D, 25-Hydroxy: 27.5 ng/mL — ABNORMAL LOW (ref 30.0–100.0)

## 2020-02-04 ENCOUNTER — Ambulatory Visit: Payer: BC Managed Care – PPO | Attending: Internal Medicine

## 2020-02-04 DIAGNOSIS — Z23 Encounter for immunization: Secondary | ICD-10-CM | POA: Insufficient documentation

## 2020-02-04 NOTE — Progress Notes (Signed)
   Covid-19 Vaccination Clinic  Name:  Amanda Baldwin    MRN: [id number] DOB: 1956-06-22  02/04/2020  Amanda Baldwin was observed post Covid-19 immunization for 15 minutes without incidence. She was provided with Vaccine Information Sheet and instruction to access the V-Safe system.   Amanda Baldwin was instructed to call 911 with any severe reactions post vaccine: Marland Kitchen Difficulty breathing  . Swelling of your face and throat  . A fast heartbeat  . A bad rash all over your body  . Dizziness and weakness    Immunizations Administered    Name Date Dose VIS Date Route   Pfizer COVID-19 Vaccine 02/04/2020 10:34 AM 0.3 mL 12/01/2019 Intramuscular   Manufacturer: [place]   Lot: [id number]   [place]: [id number]

## 2020-02-13 DIAGNOSIS — K5900 Constipation, unspecified: Secondary | ICD-10-CM | POA: Diagnosis not present

## 2020-02-13 DIAGNOSIS — K219 Gastro-esophageal reflux disease without esophagitis: Secondary | ICD-10-CM | POA: Diagnosis not present

## 2020-02-13 DIAGNOSIS — R140 Abdominal distension (gaseous): Secondary | ICD-10-CM | POA: Diagnosis not present

## 2020-06-04 DIAGNOSIS — H40013 Open angle with borderline findings, low risk, bilateral: Secondary | ICD-10-CM | POA: Diagnosis not present

## 2020-06-04 DIAGNOSIS — Z83511 Family history of glaucoma: Secondary | ICD-10-CM | POA: Diagnosis not present

## 2020-07-23 DIAGNOSIS — M545 Low back pain: Secondary | ICD-10-CM | POA: Diagnosis not present

## 2020-07-23 DIAGNOSIS — M542 Cervicalgia: Secondary | ICD-10-CM | POA: Diagnosis not present

## 2020-09-16 DIAGNOSIS — M62838 Other muscle spasm: Secondary | ICD-10-CM | POA: Diagnosis not present

## 2020-09-16 DIAGNOSIS — M9901 Segmental and somatic dysfunction of cervical region: Secondary | ICD-10-CM | POA: Diagnosis not present

## 2020-09-16 DIAGNOSIS — M9902 Segmental and somatic dysfunction of thoracic region: Secondary | ICD-10-CM | POA: Diagnosis not present

## 2020-09-16 DIAGNOSIS — M542 Cervicalgia: Secondary | ICD-10-CM | POA: Diagnosis not present

## 2020-09-19 DIAGNOSIS — M9901 Segmental and somatic dysfunction of cervical region: Secondary | ICD-10-CM | POA: Diagnosis not present

## 2020-09-19 DIAGNOSIS — M542 Cervicalgia: Secondary | ICD-10-CM | POA: Diagnosis not present

## 2020-09-19 DIAGNOSIS — M9902 Segmental and somatic dysfunction of thoracic region: Secondary | ICD-10-CM | POA: Diagnosis not present

## 2020-09-19 DIAGNOSIS — M62838 Other muscle spasm: Secondary | ICD-10-CM | POA: Diagnosis not present

## 2020-09-25 DIAGNOSIS — M9902 Segmental and somatic dysfunction of thoracic region: Secondary | ICD-10-CM | POA: Diagnosis not present

## 2020-09-25 DIAGNOSIS — M62838 Other muscle spasm: Secondary | ICD-10-CM | POA: Diagnosis not present

## 2020-09-25 DIAGNOSIS — M9901 Segmental and somatic dysfunction of cervical region: Secondary | ICD-10-CM | POA: Diagnosis not present

## 2020-09-25 DIAGNOSIS — M542 Cervicalgia: Secondary | ICD-10-CM | POA: Diagnosis not present

## 2020-11-05 DIAGNOSIS — L905 Scar conditions and fibrosis of skin: Secondary | ICD-10-CM | POA: Diagnosis not present

## 2020-11-05 DIAGNOSIS — L820 Inflamed seborrheic keratosis: Secondary | ICD-10-CM | POA: Diagnosis not present

## 2020-11-05 DIAGNOSIS — L821 Other seborrheic keratosis: Secondary | ICD-10-CM | POA: Diagnosis not present

## 2020-11-05 DIAGNOSIS — D225 Melanocytic nevi of trunk: Secondary | ICD-10-CM | POA: Diagnosis not present

## 2020-11-05 DIAGNOSIS — L814 Other melanin hyperpigmentation: Secondary | ICD-10-CM | POA: Diagnosis not present

## 2020-11-05 DIAGNOSIS — L570 Actinic keratosis: Secondary | ICD-10-CM | POA: Diagnosis not present

## 2020-11-05 DIAGNOSIS — D2271 Melanocytic nevi of right lower limb, including hip: Secondary | ICD-10-CM | POA: Diagnosis not present

## 2020-11-05 DIAGNOSIS — D485 Neoplasm of uncertain behavior of skin: Secondary | ICD-10-CM | POA: Diagnosis not present

## 2020-11-06 DIAGNOSIS — Z1231 Encounter for screening mammogram for malignant neoplasm of breast: Secondary | ICD-10-CM | POA: Diagnosis not present

## 2020-11-06 LAB — HM MAMMOGRAPHY

## 2020-11-09 DIAGNOSIS — M5136 Other intervertebral disc degeneration, lumbar region: Secondary | ICD-10-CM | POA: Diagnosis not present

## 2020-11-09 DIAGNOSIS — Z79891 Long term (current) use of opiate analgesic: Secondary | ICD-10-CM | POA: Diagnosis not present

## 2020-11-09 DIAGNOSIS — M5030 Other cervical disc degeneration, unspecified cervical region: Secondary | ICD-10-CM | POA: Diagnosis not present

## 2020-11-11 ENCOUNTER — Encounter: Payer: Self-pay | Admitting: Family Medicine

## 2020-11-18 ENCOUNTER — Encounter: Payer: Self-pay | Admitting: Family Medicine

## 2020-11-18 DIAGNOSIS — M9903 Segmental and somatic dysfunction of lumbar region: Secondary | ICD-10-CM | POA: Diagnosis not present

## 2020-11-18 DIAGNOSIS — M9904 Segmental and somatic dysfunction of sacral region: Secondary | ICD-10-CM | POA: Diagnosis not present

## 2020-11-18 DIAGNOSIS — M545 Low back pain, unspecified: Secondary | ICD-10-CM | POA: Diagnosis not present

## 2020-11-18 DIAGNOSIS — M542 Cervicalgia: Secondary | ICD-10-CM | POA: Diagnosis not present

## 2020-11-20 ENCOUNTER — Encounter: Payer: Self-pay | Admitting: Family Medicine

## 2020-12-06 DIAGNOSIS — Z01419 Encounter for gynecological examination (general) (routine) without abnormal findings: Secondary | ICD-10-CM | POA: Diagnosis not present

## 2020-12-06 DIAGNOSIS — Z6830 Body mass index (BMI) 30.0-30.9, adult: Secondary | ICD-10-CM | POA: Diagnosis not present

## 2020-12-06 DIAGNOSIS — Z124 Encounter for screening for malignant neoplasm of cervix: Secondary | ICD-10-CM | POA: Diagnosis not present

## 2020-12-06 LAB — HM PAP SMEAR: HM Pap smear: NEGATIVE

## 2020-12-11 DIAGNOSIS — M542 Cervicalgia: Secondary | ICD-10-CM | POA: Diagnosis not present

## 2020-12-11 DIAGNOSIS — M9904 Segmental and somatic dysfunction of sacral region: Secondary | ICD-10-CM | POA: Diagnosis not present

## 2020-12-11 DIAGNOSIS — M545 Low back pain, unspecified: Secondary | ICD-10-CM | POA: Diagnosis not present

## 2020-12-11 DIAGNOSIS — M9903 Segmental and somatic dysfunction of lumbar region: Secondary | ICD-10-CM | POA: Diagnosis not present

## 2021-02-05 NOTE — Progress Notes (Signed)
Chief Complaint  Patient presents with  . Annual Exam    Fasting annual exam no pap sees Dr.Mody and sees Dr. Teodoro Spray for yearly eye exams. Has a place on her leg she would like you to look at. Was looking at a sheet of paper and a line was blank and then when she went back the line was there.     Amanda Baldwin is a 65 y.o. female who presents for a complete physical.  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  She saw a blank space on spreadsheet, wasn't blank when she went back to look at it.  There was another episode at [facility] looked inside a box, which contained 2 things, but she saw only one (saw both when she went and looked back in it later). Happened twice around Christmas, none since. No associated headache or other vision problems. She just got a card that she is due for her eye exam. She is scared  There is a spot on her RLE that when she touches/rubs it (putting on lotion, showering), it can hurt for 30 minutes.  Doesn't occur every time she touches it, occurs 3-4x/week.  Wonders if it is a nerve. Pain is dull, not tingling, burning. No discomfort with exertion.  HTN--Blood pressures haven't been checked regularly at home.  Last night was 126/62, 134/65 (took it twice last night), checked it once for a virtual visit with [facility] (at work), and recalls it was "fine". She has been taking Lisinopril 20mg  since 2018. She has just a slight cough (likely from postnasal drainage, chronic).  Occasional dry cough not bothersome.  She was noted to have asymmetry of kidneys on 01/2017 abdominal US. She was sent for Duplex which didn't show significant blockage (1-59% bilaterally). Dr. Donnetta Hutching had said to consider CT angiogram (if kidney function remains normal) only if she has persistent difficult to control hypertension (which she hasn't demonstrated). Denies headaches (other than related to neck  tension), lightheadedness, chest pain, palpitations. Only occasional vertigo, "seasick" and vomiting (last occurred a month ago), related to when neck is flaring, treated by chiropractor. She had echo back in 2013, notable for moderate diastolic dysfunction,EF 90-24%.   Depression: Doing well on Zoloft, moods good.She is taking 100mg  and is doing well. +work stress, though is a little better than last year.  They limited overtime to 53 hours/week (had been working up to 55).  Should be back in the office in March 3 days/week.  She isn't ready to retire, doesn't have a "plan" for retirement.  Hyperlipidemia--previously took meds, but has been off lipid-lowering meds for years, and numbers had been okay. Been a little higher recently.Has been following low cholesterol diet. Diet is unchanged from last year, when she reported: doesn't eat creamy foods/soups.  Continues to eat 1 egg daily.  Red meat 2-3x/week.  Uses lowfat dairy.  See below for labs/lipids done through [facility] at work in November.  Lab Results  Component Value Date   CHOL 243 (H) 01/31/2020   HDL 74 01/31/2020   LDLCALC 151 (H) 01/31/2020   TRIG 102 01/31/2020   CHOLHDL 3.3 01/31/2020   Last year she had gained weight, having more sugar and sweets, related to work stress. She has continued to gain some weight.  H/o Elevated LFT's--abdominal US was done 01/2017 showing "question mild fatty infiltration of the liver." LFT's have been monitored  regularly.  Lab Results  Component Value Date   ALT 40 (H) 01/31/2020   AST 34 01/31/2020   ALKPHOS 134 (H) 01/31/2020   BILITOT 0.2 01/31/2020   Vitamin D deficiency--level was low at 26in 12/2015, 35.6 on f/u after that, when had been getting 2800 IU total dose from her vitamins. Last check was low at 27.5 on same vitamins, in 01/2020.  At that time she increased her D dose from 1000 to 2000, and continues MVI daily.  Allergies: She has been using Flonase sporadically, just  prn. Uses antihistamines more regularly, in place of Flonase.  Uses children's dose of Allegra just as needed, used last night.  Having some stuffiness today  She is using saline spray occasionally. Cats sleep with her. She was told many years ago she has a slight cat allergy.   She has some degenerative disease in her neck and back, sees chiropractor regularly and sees Dr. Nelva Bush for pain management.  She takes Tylenol #3, one daily, and advil in the afternoon (2 of them), most days.  Hasn't tried taking tylenol in the afternoon (told to avoid tylenol because of liver in the past). She is prescribed #120 every 4 months from Dr. Nelva Bush, last 11/10/2019.  Immunization History  Administered Date(s) Administered  . Influenza Split 11/06/2009, 11/11/2009, 09/30/2011, 10/28/2012, 09/29/2013, 09/20/2014, 09/27/2015, 09/21/2019  . Influenza,inj,Quad PF,6+ Mos 10/07/2019, 10/02/2020  . Influenza-Unspecified 09/22/2015, 10/09/2016, 10/13/2017, 09/28/2018, 10/07/2019  . PFIZER(Purple Top)SARS-COV-2 Vaccination 01/13/2020, 02/04/2020  . Td 11/06/2001  . Tdap 10/19/2011  . Zoster 12/09/2012  . Zoster Recombinat (Shingrix) 07/28/2019, 11/27/2019   Last Pap smear: UTD through GYN (yearly visits, in November/December) Last mammogram: 10/2020 Last colonoscopy:10/2017, Dr. Watt Climes; showed diverticulosis and diminutive polyp--path showed polypoid colonic mucosa with lymphoid aggregate. He recommended 5 year f/u. Last DEXA: through GYN, 2014, reportedly normal. It was ordered again in 2019, never got it done. Dentist: twice yearly  Ophtho: yearly Exercise:  Nothing regular.  Has exercise bike, uses it sporadically. Up and down stairs 6-8x/day, throughout the day.  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  She believes she has a living will and healthcare power of attorney, but she plans to look at it again to see if anything needs to be changed.  Depression screen: negative Falls: none  Other doctors caring for patient:  GYN: Dr. Benjie Karvonen GI: Dr. Watt Climes Ophtho: Dr. Teodoro Spray Pain: Dr. Nelva Bush Derm: Lennie Odor, [place] (at [facility])   [place], [facility] ,[facility] and [place] reviewed and updated  Outpatient Encounter Medications as of 02/06/2021  Medication Sig Note  . acetaminophen-codeine (TYLENOL #3) 300-30 MG per tablet Take 1 tablet by mouth every 6 (six) hours as needed for moderate pain. 01/31/2020: Takes 1/day  . ascorbic acid (VITAMIN C) 500 MG tablet Take 1,000 mg by mouth daily.   . B Complex-C (SUPER B COMPLEX PO) Take 1 capsule by mouth daily.   . cholecalciferol (VITAMIN D) 1000 units tablet Take 2,000 Units by mouth daily.   Marland Kitchen ibuprofen (ADVIL,MOTRIN) 200 MG tablet Take 200-800 mg by mouth every 6 (six) hours as needed for headache or mild pain. 02/06/2021: Takes 2 pills almost every day, sometimes more  . ketotifen (  ZADITOR) 0.025 % ophthalmic solution Place 1 drop into both eyes every morning.   . Lifitegrast 5 % SOLN Place 1 drop into both eyes 2 (two) times daily.  2/17/2022Shirley Friar (from Dr. Sabra Heck, ophtho)  . lisinopril (PRINIVIL,ZESTRIL) 20 MG tablet Take 20 mg by mouth daily.   . Magnesium 250 MG TABS Take 1 tablet by mouth every evening.   . Multiple Vitamin (MULTIVITAMIN) tablet Take 1 tablet by mouth daily.   Marland Kitchen omeprazole-sodium bicarbonate (ZEGERID) 40-1100 MG per capsule Take 1 capsule by mouth at bedtime. 02/06/2021: Gets from Dr. Watt Climes  . sertraline (ZOLOFT) 100 MG tablet TAKE 1 TABLET (100 MG TOTAL) BY MOUTH DAILY.   Marland Kitchen dicyclomine (BENTYL) 10 MG capsule Take 10 mg by mouth 3 (three) times daily before meals.  (Patient not taking: Reported on 02/06/2021) 01/31/2020: Rarely needs  . docusate sodium (COLACE) 100 MG capsule Take 100 mg by  mouth 2 (two) times daily. (Patient not taking: Reported on 02/06/2021) 02/06/2021: Uses occasionally  . fexofenadine (ALLEGRA) 180 MG tablet Take 180 mg by mouth daily. (Patient not taking: Reported on 02/06/2021) 02/06/2021: Takes children's dose once daily as needed  . fluticasone (FLONASE) 50 MCG/ACT nasal spray Place 2 sprays into the nose daily as needed for allergies. Reported on 01/20/2016 (Patient not taking: Reported on 02/06/2021) 02/06/2021: Ran out, uses seasonally  . methocarbamol (ROBAXIN) 500 MG tablet Take 500 mg by mouth every 8 (eight) hours as needed (neck pain). Reported on 01/20/2016 (Patient not taking: Reported on 02/06/2021)   . polyethylene glycol (MIRALAX / GLYCOLAX) packet Take 17 g by mouth daily as needed for mild constipation or moderate constipation. (Patient not taking: Reported on 02/06/2021) 01/31/2020: As needed  . senna (SENOKOT) 8.6 MG tablet Take 1 tablet by mouth daily. (Patient not taking: Reported on 02/06/2021)   . simethicone (MYLICON) 485 MG chewable tablet Chew 125 mg by mouth every 6 (six) hours as needed for flatulence. (Patient not taking: Reported on 02/06/2021)    No facility-administered encounter medications on file as of 02/06/2021.   Allergies  Allergen Reactions  . Darvocet [Propoxyphene N-Acetaminophen] Other (See Comments)    Faint, blood pressure goes sky high.  . Etodolac Other (See Comments)    Red face, arms-looked like bad sunburn.  Sarina Ill [Bactrim] Hives    ROS: The patient denies anorexia, fever, decreased hearing, ear pain, sore throat, breast concerns, chest pain, palpitations, syncope, dyspnea on exertion, cough, swelling, nausea, vomiting, diarrhea, melena, hematochezia, indigestion/heartburn, hematuria, incontinence, dysuria, vaginal bleeding, discharge, odor or itch, genital lesions, numbness, tingling, weakness, suspicious skin lesions, anxiety, abnormal bleeding/bruising, or enlarged lymph nodes.  She sees dermatologist yearly for skin  checks. Depressionis stable/controlled, + work stress. Chronic back pain/arthritis, and neck pain, chiro helps Occasional bloating and gas (known HH/GERD); bowels are normal. Weight gain. No changes to hair/skin/nails   PHYSICAL EXAM:  BP 132/72   Pulse 72   Ht 5\' 6"  (1.676 m)   Wt 189 lb 9.6 oz (86 kg)   BMI 30.60 kg/m   Wt Readings from Last 3 Encounters:  02/06/21 189 lb 9.6 oz (86 kg)  01/31/20 184 lb 3.2 oz (83.6 kg)  01/26/19 171 lb (77.6 kg)    Declines changing into gown--sees GYN and dermatologist regularly.  General Appearance:  Alert, cooperative, no distress, appears stated age   Head:  Normocephalic, without obvious abnormality, atraumatic   Eyes:  PERRL, conjunctiva/corneas clear, EOM's intact, fundi benign   Ears:  Normal TM's and external ear  canals   Nose:  Not examined, wearing mask due to COVID-19 pandemic  Throat:  Not examined, wearing mask due to COVID-19 pandemic  Neck:  Supple, no lymphadenopathy; thyroid: no enlargement/ tenderness/nodules; no carotid bruit or JVD   Back:  Spine nontender, no curvature, ROM normal, no CVA tenderness   Lungs:  Clear to auscultation bilaterally without wheezes, rales or ronchi; respirations unlabored   Chest Wall:  No tenderness or deformity   Heart:  Regular rate and rhythm, S1 and S2 normal, no murmur, rub or gallop   Breast Exam:  Deferred to GYN   Abdomen:  Soft, non-tender, nondistended, normoactive bowel sounds, no masses, no hepatosplenomegaly   Genitalia:  Deferred to GYN      Extremities:  No clubbing, cyanosis or edema. Eval of RLE is normal, nontender  Pulses:  2+ and symmetric all extremities   Skin:  Skin color, texture, turgor normal, no rashes or lesions.Limited exam (chose not to get into gown, sees derm)  Lymph nodes:  Cervical, supraclavicular axillary nodes normal   Neurologic:  Normal strength, sensation and gait; reflexes  2+ and symmetric throughout    Psych: Normal mood, affect, hygiene and grooming   ASSESSMENT/PLAN:  Annual physical exam - Plan: POCT Urinalysis DIP (Proadvantage Device)  Essential hypertension, benign - controlled on lisinopril.  Encouraged daily exercise and weight loss  Pure hypercholesterolemia - Diet reviewed in detail; LDL a little higher, borderline ASCVD score.  Would like to work more on diet  Vitamin D deficiency - cont current supplements (2000 IU D3, plus MVI)  Depression, major, in remission ([place]) - cont Zoloft; discussed continuing through pandemic, work stress, and even initial retirement. counseled re: having plans/structure for retirement  Allergic rhinitis, unspecified seasonality, unspecified trigger - Plan: fluticasone (FLONASE) 50 MCG/ACT nasal spray  Medication monitoring encounter  Need for Tdap vaccination - Plan: Tdap vaccine greater than or equal to 7yo IM  Need for pneumococcal vaccination - Plan: Pneumococcal polysaccharide vaccine 23-valent greater than or equal to 2yo subcutaneous/IM  BMI 30.0-30.9,adult - counseled re: risks, diet, exercise.  Has had some poss fatty liver and elevated LFT's; encouraged wt loss. Consider f/u US at some point  Vision loss - very focal, like a blank spot, short-lived; occurred 2x, 2 mos ago. Ddx reviewed, poss ophthalmic migraine.  Due for ophtho.   ASCVD risk 9.4% intermediate risk CT abd 11/2018, abd xr 12/2018, no mention of atherosclerosis.  Counseled re: chronic NSAID risks, to use more tylenol (once daily, plus her T#3 won't damage liver) Counseled re: allergies, which meds to use and how Counseled re: weight--healthy diet, exercise, wt loss  Requesting GYN records, pap. Got DEXA results from [place], done 2014. Recommended repeat.  She should contact [facility] to schedule (and they can get order from GYN, who had recommended repeat in 2019)  Discussed monthly self breast exams and yearly  mammograms; at least 30 minutes of aerobic activity at least 5 days/week, weight-bearing exercise at least 2x/week; proper sunscreen use reviewed; healthy diet, including goals of calcium and vitamin D intake and alcohol recommendations (less than or equal to 1 drink/day) reviewed; regular seatbelt use; changing batteries in smoke detectors. Immunization recommendations discussed--TdaP given.  Discussed pneumococcal vaccines--she never came for Prevnar-13 last year as recommended.  At this point, since Prevnar-20 is now out (but not covered yet for her, not on Medicare), will give pneumovax today, and prevnar-20 next year or when covered. Continue yearly flu shots. Colonoscopy recommendations reviewed--UTD, due again 10/2022  F/u 1 year, sooner prn.  I spent 80 minutes dedicated to the care of this patient, including pre-visit review of records, face to face time (including significant counseling separate from wellness visit), post-visit ordering of testing and documentation.

## 2021-02-05 NOTE — Patient Instructions (Addendum)
HEALTH MAINTENANCE RECOMMENDATIONS:  It is recommended that you get at least 30 minutes of aerobic exercise at least 5 days/week (for weight loss, you may need as much as 60-90 minutes). This can be any activity that gets your heart rate up. This can be divided in 10-15 minute intervals if needed, but try and build up your endurance at least once a week.  Weight bearing exercise is also recommended twice weekly.  Eat a healthy diet with lots of vegetables, fruits and fiber.  "Colorful" foods have a lot of vitamins (ie green vegetables, tomatoes, red peppers, etc).  Limit sweet tea, regular sodas and alcoholic beverages, all of which has a lot of calories and sugar.  Up to 1 alcoholic drink daily may be beneficial for women (unless trying to lose weight, watch sugars).  Drink a lot of water.  Calcium recommendations are 1200-1500 mg daily (1500 mg for postmenopausal women or women without ovaries), and vitamin D 1000 IU daily.  This should be obtained from diet and/or supplements (vitamins), and calcium should not be taken all at once, but in divided doses.  Monthly self breast exams and yearly mammograms for women over the age of 26 is recommended.  Sunscreen of at least SPF 30 should be used on all sun-exposed parts of the skin when outside between the hours of 10 am and 4 pm (not just when at beach or pool, but even with exercise, golf, tennis, and yard work!)  Use a sunscreen that says "broad spectrum" so it covers both UVA and UVB rays, and make sure to reapply every 1-2 hours.  Remember to change the batteries in your smoke detectors when changing your clock times in the spring and fall. Carbon monoxide detectors are recommended for your home.  Use your seat belt every time you are in a car, and please drive safely and not be distracted with cell phones and texting while driving.  ASCVD risk 9.4% intermediate risk Please cut back on cholesterol in the diet, and exercise regularly in order to  improve the lipid profile.   Cholesterol Content in Foods Cholesterol is a waxy, fat-like substance that helps to carry fat in the blood. The body needs cholesterol in small amounts, but too much cholesterol can cause damage to the arteries and heart. Most people should eat less than 200 milligrams (mg) of cholesterol a day. Foods with cholesterol Cholesterol is found in animal-based foods, such as meat, seafood, and dairy. Generally, low-fat dairy and lean meats have less cholesterol than full-fat dairy and fatty meats. The milligrams of cholesterol per serving (mg per serving) of common cholesterol-containing foods are listed below. Meat and other proteins  Egg -- one large whole egg has 186 mg.  Veal shank -- 4 oz has 141 mg.  Lean ground [place] (93% lean) -- 4 oz has 118 mg.  Fat-trimmed lamb loin -- 4 oz has 106 mg.  Lean ground beef (90% lean) -- 4 oz has 100 mg.  Lobster -- 3.5 oz has 90 mg.  Pork loin chops -- 4 oz has 86 mg.  Canned salmon -- 3.5 oz has 83 mg.  Fat-trimmed beef top loin -- 4 oz has 78 mg.  Frankfurter -- 1 frank (3.5 oz) has 77 mg.  Crab -- 3.5 oz has 71 mg.  Roasted chicken without skin, white meat -- 4 oz has 66 mg.  Light bologna -- 2 oz has 45 mg.  Deli-cut [place] -- 2 oz has 31 mg.  Canned tuna --  3.5 oz has 31 mg.  [place] Salines -- 1 oz has 29 mg.  Oysters and mussels (raw) -- 3.5 oz has 25 mg.  Mackerel -- 1 oz has 22 mg.  Trout -- 1 oz has 20 mg.  Pork sausage -- 1 link (1 oz) has 17 mg.  Salmon -- 1 oz has 16 mg.  Tilapia -- 1 oz has 14 mg. Dairy  Soft-serve ice cream --  cup (4 oz) has 103 mg.  Whole-milk yogurt -- 1 cup (8 oz) has 29 mg.  Cheddar cheese -- 1 oz has 28 mg.  American cheese -- 1 oz has 28 mg.  Whole milk -- 1 cup (8 oz) has 23 mg.  2% milk -- 1 cup (8 oz) has 18 mg.  Cream cheese -- 1 tablespoon (Tbsp) has 15 mg.  Cottage cheese --  cup (4 oz) has 14 mg.  Low-fat (1%) milk -- 1 cup (8 oz) has 10  mg.  Sour cream -- 1 Tbsp has 8.5 mg.  Low-fat yogurt -- 1 cup (8 oz) has 8 mg.  Nonfat Greek yogurt -- 1 cup (8 oz) has 7 mg.  Half-and-half cream -- 1 Tbsp has 5 mg. Fats and oils  Cod liver oil -- 1 tablespoon (Tbsp) has 82 mg.  Butter -- 1 Tbsp has 15 mg.  Lard -- 1 Tbsp has 14 mg.  Bacon grease -- 1 Tbsp has 14 mg.  Mayonnaise -- 1 Tbsp has 5-10 mg.  Margarine -- 1 Tbsp has 3-10 mg. Exact amounts of cholesterol in these foods may vary depending on specific ingredients and brands.   Foods without cholesterol Most plant-based foods do not have cholesterol unless you combine them with a food that has cholesterol. Foods without cholesterol include:  Grains and cereals.  Vegetables.  Fruits.  Vegetable oils, such as olive, canola, and sunflower oil.  Legumes, such as peas, beans, and lentils.  Nuts and seeds.  Egg whites.   Summary  The body needs cholesterol in small amounts, but too much cholesterol can cause damage to the arteries and heart.  Most people should eat less than 200 milligrams (mg) of cholesterol a day. This information is not intended to replace advice given to you by your health care provider. Make sure you discuss any questions you have with your health care provider. Document Revised: 04/29/2020 Document Reviewed: 04/29/2020 Elsevier Patient Education  2021 [place] refers to food and lifestyle choices that are based on the traditions of countries located on the [employer]. This way of eating has been shown to help prevent certain conditions and improve outcomes for people who have chronic diseases, like kidney disease and heart disease. What are tips for following this plan? Lifestyle  Cook and eat meals together with your family, when possible.  Drink enough fluid to keep your urine clear or pale yellow.  Be physically active every day. This includes: ? Aerobic exercise like running  or swimming. ? Leisure activities like gardening, walking, or housework.  Get 7-8 hours of sleep each night.  If recommended by your health care provider, drink red wine in moderation. This means 1 glass a day for nonpregnant women and 2 glasses a day for men. A glass of wine equals 5 oz (150 mL). Reading food labels  Check the serving size of packaged foods. For foods such as rice and pasta, the serving size refers to the amount of cooked product, not dry.  Check  the total fat in packaged foods. Avoid foods that have saturated fat or trans fats.  Check the ingredients list for added sugars, such as corn syrup.   Shopping  At the grocery store, buy most of your food from the areas near the walls of the store. This includes: ? Fresh fruits and vegetables (produce). ? Grains, beans, nuts, and seeds. Some of these may be available in unpackaged forms or large amounts (in bulk). ? Fresh seafood. ? Poultry and eggs. ? Low-fat dairy products.  Buy whole ingredients instead of prepackaged foods.  Buy fresh fruits and vegetables in-season from local farmers markets.  Buy frozen fruits and vegetables in resealable bags.  If you do not have access to quality fresh seafood, buy precooked frozen shrimp or canned fish, such as tuna, salmon, or sardines.  Buy small amounts of raw or cooked vegetables, salads, or olives from the deli or salad bar at your store.  Stock your pantry so you always have certain foods on hand, such as olive oil, canned tuna, canned tomatoes, rice, pasta, and beans. Cooking  Cook foods with extra-virgin olive oil instead of using butter or other vegetable oils.  Have meat as a side dish, and have vegetables or grains as your main dish. This means having meat in small portions or adding small amounts of meat to foods like pasta or stew.  Use beans or vegetables instead of meat in common dishes like chili or lasagna.  Experiment with different cooking methods. Try  roasting or broiling vegetables instead of steaming or sauteing them.  Add frozen vegetables to soups, stews, pasta, or rice.  Add nuts or seeds for added healthy fat at each meal. You can add these to yogurt, salads, or vegetable dishes.  Marinate fish or vegetables using olive oil, lemon juice, garlic, and fresh herbs. Meal planning  Plan to eat 1 vegetarian meal one day each week. Try to work up to 2 vegetarian meals, if possible.  Eat seafood 2 or more times a week.  Have healthy snacks readily available, such as: ? Vegetable sticks with hummus. ? [place] yogurt. ? Fruit and nut trail mix.  Eat balanced meals throughout the week. This includes: ? Fruit: 2-3 servings a day ? Vegetables: 4-5 servings a day ? Low-fat dairy: 2 servings a day ? Fish, poultry, or lean meat: 1 serving a day ? Beans and legumes: 2 or more servings a week ? Nuts and seeds: 1-2 servings a day ? Whole grains: 6-8 servings a day ? Extra-virgin olive oil: 3-4 servings a day  Limit red meat and sweets to only a few servings a month   What are my food choices?  Mediterranean diet ? Recommended  Grains: Whole-grain pasta. Brown rice. Bulgar wheat. Polenta. Couscous. Whole-wheat bread. [place].  Vegetables: Artichokes. Beets. Broccoli. Cabbage. Carrots. Eggplant. Green beans. Chard. Kale. Spinach. Onions. Leeks. Peas. Squash. Tomatoes. Peppers. Radishes.  Fruits: Apples. Apricots. Avocado. Berries. Bananas. Cherries. Dates. Figs. Grapes. Lemons. Melon. Oranges. Peaches. Plums. Pomegranate.  Meats and other protein foods: Beans. Almonds. Sunflower seeds. Pine nuts. Peanuts. [place]. Salmon. Scallops. Shrimp. [place]. Tilapia. Clams. Oysters. Eggs.  Dairy: Low-fat milk. Cheese. Greek yogurt.  Beverages: Water. Red wine. Herbal tea.  Fats and oils: Extra virgin olive oil. Avocado oil. Grape seed oil.  Sweets and desserts: [place] yogurt with honey. Baked apples. Poached pears. Trail mix.  Seasoning  and other foods: Basil. Cilantro. Coriander. Cumin. Mint. Parsley. Sage. Rosemary. Tarragon. Garlic. Oregano. Thyme. Pepper. Balsalmic vinegar.  Tahini. Hummus. Tomato sauce. Olives. Mushrooms. ? Limit these  Grains: Prepackaged pasta or rice dishes. Prepackaged cereal with added sugar.  Vegetables: Deep fried potatoes (french fries).  Fruits: Fruit canned in syrup.  Meats and other protein foods: Beef. Pork. Lamb. Poultry with skin. Hot dogs. Berniece Salines.  Dairy: Ice cream. Sour cream. Whole milk.  Beverages: Juice. Sugar-sweetened soft drinks. Beer. Liquor and spirits.  Fats and oils: Butter. Canola oil. Vegetable oil. Beef fat (tallow). Lard.  Sweets and desserts: Cookies. Cakes. Pies. Candy.  Seasoning and other foods: Mayonnaise. Premade sauces and marinades. The items listed may not be a complete list. Talk with your dietitian about what dietary choices are right for you. Summary  The Mediterranean diet includes both food and lifestyle choices.  Eat a variety of fresh fruits and vegetables, beans, nuts, seeds, and whole grains.  Limit the amount of red meat and sweets that you eat.  Talk with your health care provider about whether it is safe for you to drink red wine in moderation. This means 1 glass a day for nonpregnant women and 2 glasses a day for men. A glass of wine equals 5 oz (150 mL). This information is not intended to replace advice given to you by your health care provider. Make sure you discuss any questions you have with your health care provider. Document Revised: 08/06/2016 Document Reviewed: 07/30/2016 Elsevier Patient Education  [place].

## 2021-02-06 ENCOUNTER — Encounter: Payer: Self-pay | Admitting: Family Medicine

## 2021-02-06 ENCOUNTER — Other Ambulatory Visit: Payer: Self-pay

## 2021-02-06 ENCOUNTER — Ambulatory Visit: Payer: BC Managed Care – PPO | Admitting: Family Medicine

## 2021-02-06 VITALS — BP 132/72 | HR 72 | Ht 66.0 in | Wt 189.6 lb

## 2021-02-06 DIAGNOSIS — Z23 Encounter for immunization: Secondary | ICD-10-CM

## 2021-02-06 DIAGNOSIS — H547 Unspecified visual loss: Secondary | ICD-10-CM

## 2021-02-06 DIAGNOSIS — E7800 Pure hypercholesterolemia, unspecified: Secondary | ICD-10-CM | POA: Diagnosis not present

## 2021-02-06 DIAGNOSIS — Z5181 Encounter for therapeutic drug level monitoring: Secondary | ICD-10-CM

## 2021-02-06 DIAGNOSIS — J309 Allergic rhinitis, unspecified: Secondary | ICD-10-CM

## 2021-02-06 DIAGNOSIS — I10 Essential (primary) hypertension: Secondary | ICD-10-CM | POA: Diagnosis not present

## 2021-02-06 DIAGNOSIS — Z0000 Encounter for general adult medical examination without abnormal findings: Secondary | ICD-10-CM

## 2021-02-06 DIAGNOSIS — F325 Major depressive disorder, single episode, in full remission: Secondary | ICD-10-CM | POA: Diagnosis not present

## 2021-02-06 DIAGNOSIS — Z6830 Body mass index (BMI) 30.0-30.9, adult: Secondary | ICD-10-CM

## 2021-02-06 DIAGNOSIS — E559 Vitamin D deficiency, unspecified: Secondary | ICD-10-CM

## 2021-02-06 LAB — POCT URINALYSIS DIP (PROADVANTAGE DEVICE)
Bilirubin, UA: NEGATIVE
Blood, UA: NEGATIVE
Glucose, UA: NEGATIVE mg/dL
Ketones, POC UA: NEGATIVE mg/dL
Leukocytes, UA: NEGATIVE
Nitrite, UA: NEGATIVE
Protein Ur, POC: NEGATIVE mg/dL
Specific Gravity, Urine: 1.01
Urobilinogen, Ur: NEGATIVE
pH, UA: 7 (ref 5.0–8.0)

## 2021-02-06 MED ORDER — FLUTICASONE PROPIONATE 50 MCG/ACT NA SUSP: 2.0000 | Freq: Every day | NASAL | 11 refills | Status: DC

## 2021-02-10 ENCOUNTER — Encounter: Payer: Self-pay | Admitting: Family Medicine

## 2021-02-10 ENCOUNTER — Encounter: Payer: Self-pay | Admitting: *Deleted

## 2021-02-11 ENCOUNTER — Encounter: Payer: Self-pay | Admitting: Family Medicine

## 2021-03-03 ENCOUNTER — Other Ambulatory Visit: Payer: Self-pay | Admitting: Gastroenterology

## 2021-03-03 DIAGNOSIS — R945 Abnormal results of liver function studies: Secondary | ICD-10-CM

## 2021-03-03 DIAGNOSIS — R7989 Other specified abnormal findings of blood chemistry: Secondary | ICD-10-CM

## 2021-03-03 DIAGNOSIS — K5900 Constipation, unspecified: Secondary | ICD-10-CM | POA: Diagnosis not present

## 2021-03-03 DIAGNOSIS — R140 Abdominal distension (gaseous): Secondary | ICD-10-CM | POA: Diagnosis not present

## 2021-03-19 ENCOUNTER — Ambulatory Visit
Admission: RE | Admit: 2021-03-19 | Discharge: 2021-03-19 | Disposition: A | Discharge status: Home / Self Care | Payer: BC Managed Care – PPO | Source: Ambulatory Visit | Attending: Gastroenterology | Admitting: Gastroenterology

## 2021-03-19 DIAGNOSIS — R945 Abnormal results of liver function studies: Secondary | ICD-10-CM

## 2021-03-19 DIAGNOSIS — R7989 Other specified abnormal findings of blood chemistry: Secondary | ICD-10-CM

## 2021-04-30 DIAGNOSIS — Z79899 Other long term (current) drug therapy: Secondary | ICD-10-CM | POA: Diagnosis not present

## 2021-04-30 DIAGNOSIS — G894 Chronic pain syndrome: Secondary | ICD-10-CM | POA: Diagnosis not present

## 2021-05-05 DIAGNOSIS — M545 Low back pain, unspecified: Secondary | ICD-10-CM | POA: Diagnosis not present

## 2021-05-05 DIAGNOSIS — M9903 Segmental and somatic dysfunction of lumbar region: Secondary | ICD-10-CM | POA: Diagnosis not present

## 2021-05-05 DIAGNOSIS — M9904 Segmental and somatic dysfunction of sacral region: Secondary | ICD-10-CM | POA: Diagnosis not present

## 2021-05-05 DIAGNOSIS — M542 Cervicalgia: Secondary | ICD-10-CM | POA: Diagnosis not present

## 2021-05-08 DIAGNOSIS — M9904 Segmental and somatic dysfunction of sacral region: Secondary | ICD-10-CM | POA: Diagnosis not present

## 2021-05-08 DIAGNOSIS — M542 Cervicalgia: Secondary | ICD-10-CM | POA: Diagnosis not present

## 2021-05-08 DIAGNOSIS — M9903 Segmental and somatic dysfunction of lumbar region: Secondary | ICD-10-CM | POA: Diagnosis not present

## 2021-05-08 DIAGNOSIS — M545 Low back pain, unspecified: Secondary | ICD-10-CM | POA: Diagnosis not present

## 2021-05-12 DIAGNOSIS — M9904 Segmental and somatic dysfunction of sacral region: Secondary | ICD-10-CM | POA: Diagnosis not present

## 2021-05-12 DIAGNOSIS — M545 Low back pain, unspecified: Secondary | ICD-10-CM | POA: Diagnosis not present

## 2021-05-12 DIAGNOSIS — M542 Cervicalgia: Secondary | ICD-10-CM | POA: Diagnosis not present

## 2021-05-12 DIAGNOSIS — M9903 Segmental and somatic dysfunction of lumbar region: Secondary | ICD-10-CM | POA: Diagnosis not present

## 2021-05-15 ENCOUNTER — Encounter: Payer: Self-pay | Admitting: Family Medicine

## 2021-05-15 DIAGNOSIS — M9903 Segmental and somatic dysfunction of lumbar region: Secondary | ICD-10-CM | POA: Diagnosis not present

## 2021-05-15 DIAGNOSIS — M9904 Segmental and somatic dysfunction of sacral region: Secondary | ICD-10-CM | POA: Diagnosis not present

## 2021-05-15 DIAGNOSIS — M545 Low back pain, unspecified: Secondary | ICD-10-CM | POA: Diagnosis not present

## 2021-05-15 DIAGNOSIS — M542 Cervicalgia: Secondary | ICD-10-CM | POA: Diagnosis not present

## 2021-05-22 DIAGNOSIS — M9904 Segmental and somatic dysfunction of sacral region: Secondary | ICD-10-CM | POA: Diagnosis not present

## 2021-05-22 DIAGNOSIS — M545 Low back pain, unspecified: Secondary | ICD-10-CM | POA: Diagnosis not present

## 2021-05-22 DIAGNOSIS — M542 Cervicalgia: Secondary | ICD-10-CM | POA: Diagnosis not present

## 2021-05-22 DIAGNOSIS — M9903 Segmental and somatic dysfunction of lumbar region: Secondary | ICD-10-CM | POA: Diagnosis not present

## 2021-05-29 DIAGNOSIS — M542 Cervicalgia: Secondary | ICD-10-CM | POA: Diagnosis not present

## 2021-05-29 DIAGNOSIS — M545 Low back pain, unspecified: Secondary | ICD-10-CM | POA: Diagnosis not present

## 2021-05-29 DIAGNOSIS — M9904 Segmental and somatic dysfunction of sacral region: Secondary | ICD-10-CM | POA: Diagnosis not present

## 2021-05-29 DIAGNOSIS — M9903 Segmental and somatic dysfunction of lumbar region: Secondary | ICD-10-CM | POA: Diagnosis not present

## 2021-09-26 ENCOUNTER — Ambulatory Visit
Admission: RE | Admit: 2021-09-26 | Discharge: 2021-09-26 | Disposition: A | Discharge status: Home / Self Care | Payer: BC Managed Care – PPO | Source: Ambulatory Visit | Attending: Gastroenterology | Admitting: Gastroenterology

## 2021-09-26 ENCOUNTER — Other Ambulatory Visit: Payer: Self-pay | Admitting: Gastroenterology

## 2021-09-26 DIAGNOSIS — R140 Abdominal distension (gaseous): Secondary | ICD-10-CM | POA: Diagnosis not present

## 2021-09-26 DIAGNOSIS — M419 Scoliosis, unspecified: Secondary | ICD-10-CM | POA: Diagnosis not present

## 2021-09-26 DIAGNOSIS — K5900 Constipation, unspecified: Secondary | ICD-10-CM

## 2021-09-26 DIAGNOSIS — R110 Nausea: Secondary | ICD-10-CM | POA: Diagnosis not present

## 2021-09-26 DIAGNOSIS — R197 Diarrhea, unspecified: Secondary | ICD-10-CM

## 2021-09-29 DIAGNOSIS — M5030 Other cervical disc degeneration, unspecified cervical region: Secondary | ICD-10-CM | POA: Diagnosis not present

## 2021-09-29 DIAGNOSIS — M5136 Other intervertebral disc degeneration, lumbar region: Secondary | ICD-10-CM | POA: Diagnosis not present

## 2021-11-10 DIAGNOSIS — Z1231 Encounter for screening mammogram for malignant neoplasm of breast: Secondary | ICD-10-CM | POA: Diagnosis not present

## 2021-11-10 LAB — HM MAMMOGRAPHY

## 2021-11-12 ENCOUNTER — Encounter: Payer: Self-pay | Admitting: *Deleted

## 2021-11-19 ENCOUNTER — Encounter: Payer: Self-pay | Admitting: Family Medicine

## 2021-11-20 DIAGNOSIS — M9903 Segmental and somatic dysfunction of lumbar region: Secondary | ICD-10-CM | POA: Diagnosis not present

## 2021-11-20 DIAGNOSIS — M9902 Segmental and somatic dysfunction of thoracic region: Secondary | ICD-10-CM | POA: Diagnosis not present

## 2021-11-20 DIAGNOSIS — M9904 Segmental and somatic dysfunction of sacral region: Secondary | ICD-10-CM | POA: Diagnosis not present

## 2021-11-20 DIAGNOSIS — M9901 Segmental and somatic dysfunction of cervical region: Secondary | ICD-10-CM | POA: Diagnosis not present

## 2021-12-25 DIAGNOSIS — N951 Menopausal and female climacteric states: Secondary | ICD-10-CM | POA: Diagnosis not present

## 2021-12-25 DIAGNOSIS — Z01419 Encounter for gynecological examination (general) (routine) without abnormal findings: Secondary | ICD-10-CM | POA: Diagnosis not present

## 2022-01-08 DIAGNOSIS — Z8049 Family history of malignant neoplasm of other genital organs: Secondary | ICD-10-CM | POA: Diagnosis not present

## 2022-01-22 DIAGNOSIS — Z780 Asymptomatic menopausal state: Secondary | ICD-10-CM | POA: Diagnosis not present

## 2022-01-22 LAB — HM DEXA SCAN: HM Dexa Scan: NORMAL

## 2022-01-29 DIAGNOSIS — M5136 Other intervertebral disc degeneration, lumbar region: Secondary | ICD-10-CM | POA: Diagnosis not present

## 2022-01-29 DIAGNOSIS — G894 Chronic pain syndrome: Secondary | ICD-10-CM | POA: Diagnosis not present

## 2022-01-29 DIAGNOSIS — M5030 Other cervical disc degeneration, unspecified cervical region: Secondary | ICD-10-CM | POA: Diagnosis not present

## 2022-02-03 ENCOUNTER — Encounter: Payer: Self-pay | Admitting: Family Medicine

## 2022-02-10 DIAGNOSIS — M9901 Segmental and somatic dysfunction of cervical region: Secondary | ICD-10-CM | POA: Diagnosis not present

## 2022-02-10 DIAGNOSIS — M9902 Segmental and somatic dysfunction of thoracic region: Secondary | ICD-10-CM | POA: Diagnosis not present

## 2022-02-10 DIAGNOSIS — M62838 Other muscle spasm: Secondary | ICD-10-CM | POA: Diagnosis not present

## 2022-02-10 DIAGNOSIS — M542 Cervicalgia: Secondary | ICD-10-CM | POA: Diagnosis not present

## 2022-02-12 DIAGNOSIS — D225 Melanocytic nevi of trunk: Secondary | ICD-10-CM | POA: Diagnosis not present

## 2022-02-12 DIAGNOSIS — L821 Other seborrheic keratosis: Secondary | ICD-10-CM | POA: Diagnosis not present

## 2022-02-12 DIAGNOSIS — L309 Dermatitis, unspecified: Secondary | ICD-10-CM | POA: Diagnosis not present

## 2022-02-12 DIAGNOSIS — R202 Paresthesia of skin: Secondary | ICD-10-CM | POA: Diagnosis not present

## 2022-02-12 DIAGNOSIS — L570 Actinic keratosis: Secondary | ICD-10-CM | POA: Diagnosis not present

## 2022-02-16 DIAGNOSIS — M62838 Other muscle spasm: Secondary | ICD-10-CM | POA: Diagnosis not present

## 2022-02-16 DIAGNOSIS — M9902 Segmental and somatic dysfunction of thoracic region: Secondary | ICD-10-CM | POA: Diagnosis not present

## 2022-02-16 DIAGNOSIS — M9901 Segmental and somatic dysfunction of cervical region: Secondary | ICD-10-CM | POA: Diagnosis not present

## 2022-02-16 DIAGNOSIS — M542 Cervicalgia: Secondary | ICD-10-CM | POA: Diagnosis not present

## 2022-02-19 ENCOUNTER — Encounter: Payer: BC Managed Care – PPO | Admitting: Family Medicine

## 2022-02-25 ENCOUNTER — Encounter: Payer: Self-pay | Admitting: Family Medicine

## 2022-03-03 NOTE — Progress Notes (Signed)
Chief Complaint  ?Patient presents with  ? Facial Twitching  ?  Patient has noticed some right sided facial twitching over the last two weeks. Lasts for a few seconds and comes and goes. Also has some red bumps in various places that itch like mosquito bites, they do clear up after a week or so and have a blister like appearance. Her back is very itchy x several weeks to several months. Annual skin check was fine. Brought in copies of labs from work from last year.   ? ?Patient presents for evaluation of facial twitching.  She sent the following message on 3/8: ?" I have developed a facial twitch - first around my eye, but mostly around my mouth.  So far, it is not every day, but the first time it lasted about 4 days and then stopped.  But today it is back. Maybe it's stress - if so, I just need some peace of mind, but I'm scaring myself with bad thoughts about it." ? 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 ?Fredrich Birks has intervened and is helping some, less stressed now. ? ?"I am quietly freaking out" and almost crying about her mouth twitching. ?She reports that last week she saw her R eye twitching. Later the twitching moved to her mouth, where it stayed.   ?She noticed it for 4 days, went 3 days without it before it recurred, gradually getting worse. ?Now it is every day.  She feels like the twitch is pretty constant, though isn't very noticeable to others.  When she closes her eye, she feels the twitch at the R mouth. She feels like it is constantly quivering, and so it feels tired. She feels like she can't "rest" her face.  She also feels a slight discomfort at the R ear when the mouth is being "drawn up", or "twitching" as she describes it.   ? ?Depression:  She cut back on her sertraline from 140m daily, down to 530mtwice a week and 10053mhe other 5 days.  She did this about 2-3 months ago, hasn't noticed any changes.  No worsening of moods. ?She thinks she will retire  this year. ? ?H/o elevated cholesterol, LFT's. Last labs in chart were from 10/2020, and Na also low at 130 ?She brings in [facility] labs done 10/2021: ?Fasting glucose 107, rest of b-met was normal. Na 138 (was 130 last year) ?LDH slightly high at 258 (similar to last year, normal value up to 226). ?ALT 45 (elevated),  AST 36 (normal) ?GGT 109 (elevated, up from 81 last year, normal up to 60). ?TC 229, TG 120, HDL 63, LDL 145 (down from 163 last year). Chol/HDL ratio 3.6 ? ?Due to see Dr. MagWatt Climesees him yearly for GERD; last year sent for liver eval. ?[place]: ?IMPRESSION: ?Hyperechogenicity of the hepatic parenchyma. This is a nonspecific ?finding, which may be seen in the setting of hepatic steatosis or ?other chronic hepatic parenchymal disease. ?  ?Otherwise unremarkable right upper quadrant ultrasound, as ?described. ? ?Saw derm a couple of weeks ago for annual screening.  She has itching of her upper back, possibly related to a nerve.  She did also mentioned the red bumps to her.  She was prescribed a cream (used some last week), and given "pills for itching"--xyzal, hasn't started yet. ?She isn't taking  the allegra regularly, but did take one today. ?She asked her ortho also about the itchy back, and said it is "no man's  land", neck or thoracic, and likely would clear up on its own. ?Denies any pain. ? ?She also has pain a lot at her R side, flank. She reports it feels like gas.  Gas-X helps some.  Denies pain currently. She denies any bowel changes or urinary complaints.   ? ? ?PMH, PSH, SH reviewed ? ?Outpatient Encounter Medications as of 03/04/2022  ?Medication Sig Note  ? acetaminophen-codeine (TYLENOL #3) 300-30 MG per tablet Take 1 tablet by mouth every 6 (six) hours as needed for moderate pain. 01/31/2020: Takes 1/day  ? ascorbic acid (VITAMIN C) 500 MG tablet Take 1,000 mg by mouth daily.   ? B Complex-C (SUPER B COMPLEX PO) Take 1 capsule by mouth daily.   ? cholecalciferol (VITAMIN D) 1000  units tablet Take 2,000 Units by mouth daily.   ? docusate sodium (COLACE) 100 MG capsule Take 100 mg by mouth 2 (two) times daily.   ? fexofenadine (ALLEGRA) 180 MG tablet Take 180 mg by mouth daily.   ? fluticasone (FLONASE) 50 MCG/ACT nasal spray Place 2 sprays into both nostrils daily. Reported on 01/20/2016   ? ibuprofen (ADVIL,MOTRIN) 200 MG tablet Take 200-800 mg by mouth every 6 (six) hours as needed for headache or mild pain. 02/06/2021: Takes 2 pills almost every day, sometimes more  ? ketotifen (ZADITOR) 0.025 % ophthalmic solution Place 1 drop into both eyes every morning.   ? Lifitegrast 5 % SOLN Place 1 drop into both eyes 2 (two) times daily.  2/17/2022Shirley Friar (from Dr. Sabra Heck, ophtho)  ? lisinopril (PRINIVIL,ZESTRIL) 20 MG tablet Take 20 mg by mouth daily.   ? Magnesium 250 MG TABS Take 1 tablet by mouth every evening.   ? Multiple Vitamin (MULTIVITAMIN) tablet Take 1 tablet by mouth daily.   ? pantoprazole (PROTONIX) 40 MG tablet Take 40 mg by mouth daily.   ? polyethylene glycol (MIRALAX / GLYCOLAX) packet Take 17 g by mouth daily as needed for mild constipation or moderate constipation. 01/31/2020: As needed  ? sertraline (ZOLOFT) 100 MG tablet TAKE 1 TABLET (100 MG TOTAL) BY MOUTH DAILY. 03/04/2022: Two times a week she takes 50, all other days 172m  ? simethicone (MYLICON) 1700MG chewable tablet Chew 125 mg by mouth every 6 (six) hours as needed for flatulence.   ? dicyclomine (BENTYL) 10 MG capsule Take 10 mg by mouth 3 (three) times daily before meals.  (Patient not taking: Reported on 02/06/2021) 01/31/2020: Rarely needs  ? levocetirizine (XYZAL) 5 MG tablet Take 5 mg by mouth at bedtime as needed. (Patient not taking: Reported on 03/04/2022) 03/04/2022: From Derm, has not yet started  ? methocarbamol (ROBAXIN) 500 MG tablet Take 500 mg by mouth every 8 (eight) hours as needed (neck pain). Reported on 01/20/2016 (Patient not taking: Reported on 02/06/2021) 03/04/2022: As needed  ? senna (SENOKOT) 8.6  MG tablet Take 1 tablet by mouth daily. (Patient not taking: Reported on 03/04/2022) 03/04/2022: Last dose yesterday  ? triamcinolone cream (KENALOG) 0.1 % Apply topically. (Patient not taking: Reported on 03/04/2022) 03/04/2022: Has not used in a week-from derm  ? [DISCONTINUED] omeprazole-sodium bicarbonate (ZEGERID) 40-1100 MG per capsule Take 1 capsule by mouth at bedtime. 02/06/2021: Gets from Dr. MWatt Climes ? ?No facility-administered encounter medications on file as of 03/04/2022.  ? ?Allergies  ?Allergen Reactions  ? Darvocet [Propoxyphene N-Acetaminophen] Other (See Comments)  ?  Faint, blood pressure goes sky high.  ? Etodolac Other (See Comments)  ?  Red face, arms-looked like bad  sunburn.  ? Septra [Bactrim] Hives  ? ?ROS: no fever, chills, URI symptoms, dizziness, chest pain.  +R flank pain intermittently (feels like gas).  No n/v/d or bowel changes.  +stress, but manageable. ?Skin concerns per HPI (bumps, itching of upper back). ?Eye twitching and mouth concerns, per HPI. ?Denies facial pain, facial rash, numbness, tingling, vision or hearing changes. ? ? ?PHYSICAL EXAM: ? ?BP 132/74   Pulse 80   Ht _0  (1.676 m)   Wt 185 lb 3.2 oz (84 kg)   BMI 29.89 kg/m?  ? ?Pleasant female. ?She is alert and oriented. ?She is anxious/scared, and at one point was on the verge of tears. ?HEENT conjunctiva and sclera are clear, EOMI, fundi benign. TM's and EAC's normal. ?When she closes her eyes, the right side of her mouth gets drawn up slightly (not a twitch). ?If she squeezes her eyes tightly, this doesn't seem to happen). When this occurs, she also feels a pulling sensation at her R ear. ?CN 2-12 intact. Normal smile, no weakness. ?Normal strength, gait. ?Neck: no lymphadenopathy, carotid bruit ?Heart: regular rate and rhythm ?Lungs: clear bilaterally ?Abdomen: soft, nontender, no mass ?Back: no spinal or CVA tenderness ?Extremities: no edema ? ? ?ASSESSMENT/PLAN: ? ?Facial twitching - check electrolytes, head CT.  Truly isn't twitching, but drawing up of R lip when eye closes, and some quivering of muscle. May need neuro eval - Plan: CT HEAD WO CONTRAST (5MM), Magnesium, Comprehensive metabolic panel ? ?Medication monitorin

## 2022-03-04 ENCOUNTER — Encounter: Payer: Self-pay | Admitting: Family Medicine

## 2022-03-04 ENCOUNTER — Other Ambulatory Visit: Payer: Self-pay

## 2022-03-04 ENCOUNTER — Ambulatory Visit (INDEPENDENT_AMBULATORY_CARE_PROVIDER_SITE_OTHER): Payer: BC Managed Care – PPO | Admitting: Family Medicine

## 2022-03-04 ENCOUNTER — Ambulatory Visit
Admission: RE | Admit: 2022-03-04 | Discharge: 2022-03-04 | Disposition: A | Discharge status: Home / Self Care | Payer: BC Managed Care – PPO | Source: Ambulatory Visit | Attending: Family Medicine | Admitting: Family Medicine

## 2022-03-04 VITALS — BP 132/74 | HR 80 | Ht 66.0 in | Wt 185.2 lb

## 2022-03-04 DIAGNOSIS — G514 Facial myokymia: Secondary | ICD-10-CM

## 2022-03-04 DIAGNOSIS — F325 Major depressive disorder, single episode, in full remission: Secondary | ICD-10-CM | POA: Diagnosis not present

## 2022-03-04 DIAGNOSIS — Z5181 Encounter for therapeutic drug level monitoring: Secondary | ICD-10-CM | POA: Diagnosis not present

## 2022-03-04 NOTE — Patient Instructions (Addendum)
You may try the xyzal in place of Allegra (don't use both), which might help with any itching (either from the red spots, or your upper back).  If you develop pain in the upper back, then discuss with your ortho. ? ?We are checking your electrolytes to make sure nothing is contributing to the abnormal facial movements. ?We are also going to check a CT scan to make sure nothing is wrong in the brain. ?If I don't find anything wrong, and this persists, we will send you to the neurologist for further evaluation. ? ?Go back to '100mg'$  daily of the sertraline ?

## 2022-03-05 ENCOUNTER — Encounter: Payer: Self-pay | Admitting: Family Medicine

## 2022-03-05 ENCOUNTER — Other Ambulatory Visit: Payer: Self-pay | Admitting: Family Medicine

## 2022-03-05 DIAGNOSIS — M542 Cervicalgia: Secondary | ICD-10-CM | POA: Diagnosis not present

## 2022-03-05 DIAGNOSIS — M9902 Segmental and somatic dysfunction of thoracic region: Secondary | ICD-10-CM | POA: Diagnosis not present

## 2022-03-05 DIAGNOSIS — M62838 Other muscle spasm: Secondary | ICD-10-CM | POA: Diagnosis not present

## 2022-03-05 DIAGNOSIS — M9901 Segmental and somatic dysfunction of cervical region: Secondary | ICD-10-CM | POA: Diagnosis not present

## 2022-03-05 LAB — COMPREHENSIVE METABOLIC PANEL
ALT: 50 IU/L — ABNORMAL HIGH (ref 0–32)
AST: 45 IU/L — ABNORMAL HIGH (ref 0–40)
Albumin/Globulin Ratio: 2 (ref 1.2–2.2)
Albumin: 4.8 g/dL (ref 3.8–4.8)
Alkaline Phosphatase: 132 IU/L — ABNORMAL HIGH (ref 44–121)
BUN/Creatinine Ratio: 16 (ref 12–28)
BUN: 13 mg/dL (ref 8–27)
Bilirubin Total: 0.2 mg/dL (ref 0.0–1.2)
CO2: 19 mmol/L — ABNORMAL LOW (ref 20–29)
Calcium: 9.8 mg/dL (ref 8.7–10.3)
Chloride: 102 mmol/L (ref 96–106)
Creatinine, Ser: 0.83 mg/dL (ref 0.57–1.00)
Globulin, Total: 2.4 g/dL (ref 1.5–4.5)
Glucose: 93 mg/dL (ref 70–99)
Potassium: 4.6 mmol/L (ref 3.5–5.2)
Sodium: 137 mmol/L (ref 134–144)
Total Protein: 7.2 g/dL (ref 6.0–8.5)
eGFR: 77 mL/min/{1.73_m2} (ref >59)

## 2022-03-05 LAB — MAGNESIUM: Magnesium: 2.2 mg/dL (ref 1.6–2.3)

## 2022-03-11 DIAGNOSIS — M62838 Other muscle spasm: Secondary | ICD-10-CM | POA: Diagnosis not present

## 2022-03-11 DIAGNOSIS — M9902 Segmental and somatic dysfunction of thoracic region: Secondary | ICD-10-CM | POA: Diagnosis not present

## 2022-03-11 DIAGNOSIS — M9901 Segmental and somatic dysfunction of cervical region: Secondary | ICD-10-CM | POA: Diagnosis not present

## 2022-03-11 DIAGNOSIS — M542 Cervicalgia: Secondary | ICD-10-CM | POA: Diagnosis not present

## 2022-03-17 DIAGNOSIS — M542 Cervicalgia: Secondary | ICD-10-CM | POA: Diagnosis not present

## 2022-03-17 DIAGNOSIS — M62838 Other muscle spasm: Secondary | ICD-10-CM | POA: Diagnosis not present

## 2022-03-17 DIAGNOSIS — M9901 Segmental and somatic dysfunction of cervical region: Secondary | ICD-10-CM | POA: Diagnosis not present

## 2022-03-17 DIAGNOSIS — M9902 Segmental and somatic dysfunction of thoracic region: Secondary | ICD-10-CM | POA: Diagnosis not present

## 2022-05-12 DIAGNOSIS — M62838 Other muscle spasm: Secondary | ICD-10-CM | POA: Diagnosis not present

## 2022-05-12 DIAGNOSIS — M542 Cervicalgia: Secondary | ICD-10-CM | POA: Diagnosis not present

## 2022-05-12 DIAGNOSIS — M9901 Segmental and somatic dysfunction of cervical region: Secondary | ICD-10-CM | POA: Diagnosis not present

## 2022-05-12 DIAGNOSIS — M9902 Segmental and somatic dysfunction of thoracic region: Secondary | ICD-10-CM | POA: Diagnosis not present

## 2022-05-13 DIAGNOSIS — R7989 Other specified abnormal findings of blood chemistry: Secondary | ICD-10-CM | POA: Insufficient documentation

## 2022-05-13 NOTE — Progress Notes (Unsigned)
No chief complaint on file.   Amanda Baldwin is a 66 y.o. female who presents for a complete physical.  She has the following concerns:  She was seen in March with facial twitching, a drawing up of her lip when she closed her eyes (R side).  She had CT and was referred to neurology. She had reported improvement in this after seeing her chiropractor, so held off on scheduling appointment with the neurologist. CT scan showed: IMPRESSION: There is an area of relatively decreased density within the anterior inferior right frontal lobe cortical gray-white interface that appears horizontal bandlike in the region of similar linear beam hardening artifact from the adjacent skull. This is favored to be artifactual. An acute to subacute stroke in this region is considered but felt less likely. If clinical concern persists for an acute infarction and the patient can safely undergo MRI, MRI may be helpful for further evaluation. Mild-to-moderate atrophy within normal limits for patient age.  HTN--Blood pressures at home  120/80 recently at clinic at work.  She gets lisinopril 39m refilled from work. She denies headaches (some related to neck tension only), chest pain, palpitations, shortness of breath, edema. Asymmetry of kidneys was noted on 01/2017 abdominal [place] She was sent for Duplex which didn't show significant blockage (1-59% bilaterally).  Dr. EDonnetta Hutchinghad said to consider CT angiogram (if kidney function remains normal) only if she has persistent difficult to control hypertension (which she hasn't demonstrated). She had echo back in 2013, notable for moderate diastolic dysfunction, EF 649-75%   Depression:  She cut back on her sertraline from 1016mdaily, down to 5071mwice a week and 100m51me other 5 days (4-5 months ago), hasn't noticed any changes.  No worsening of moods. She thinks she will retire this year.   Hyperlipidemia--previously took meds, but has been off lipid-lowering meds for  years.  Has been following low cholesterol diet. Diet is unchanged from last year-- No creamy foods/soups, 1 egg daily.  Red meat 2-3x/week.  Uses lowfat dairy.   Lab Results  Component Value Date   CHOL 243 (H) 01/31/2020   HDL 74 01/31/2020   LDLCALC 151 (H) 01/31/2020   TRIG 102 01/31/2020   CHOLHDL 3.3 01/31/2020    H/o elevated LFT's.  Abdominal [place] done 01/2017 showing "question mild fatty infiltration of the liver."  LFT's have been monitored regularly.  Dr. MagoWatt Climeseated US [place]/2022. He also treats her reflux. IMPRESSION: Hyperechogenicity of the hepatic parenchyma. This is a nonspecific finding, which may be seen in the setting of hepatic steatosis or other chronic hepatic parenchymal disease. Otherwise unremarkable right upper quadrant ultrasound, as described. Lab Results  Component Value Date   ALT 50 (H) 03/04/2022   AST 45 (H) 03/04/2022   ALKPHOS 132 (H) 03/04/2022   BILITOT 0.2 03/04/2022    [facility] labs done 10/2021: Fasting glucose 107, rest of b-met was normal. Na 138 (was 130 last year) LDH slightly high at 258 (similar to last year, normal value up to 226). ALT 45 (elevated),  AST 36 (normal) GGT 109 (elevated, up from 81 last year, normal up to 60). TC 229, TG 120, HDL 63, LDL 145 (down from 163 last year). Chol/HDL ratio 3.6 Normal thyroid studies and CBC   Vitamin D deficiency--Last check was low at 27.5 in 01/2020.  At that time she increased her D dose from 1000 to 2000, and continues MVI daily. Due for recheck.  Allergies: She has been using Flonase  sporadically, just prn. Uses antihistamines more regularly, in place of Flonase.  Uses children's dose of Allegra just as needed, used last night.  Having some stuffiness today  She is using saline spray occasionally. Cats sleep with her.  She was told many years ago she has a slight cat allergy.   She has some degenerative disease in her neck and back, sees chiropractor regularly and sees Dr. Nelva Bush  for pain management.   She takes Tylenol #3, one daily, and advil in the afternoon (2 of them), most days.  Hasn't tried taking tylenol in the afternoon (told to avoid tylenol because of liver in the past). UPDATE  She is prescribed #120 every 4 months from Dr. Nelva Bush, last 04/18/2022.  Immunization History  Administered Date(s) Administered   Influenza Split 11/06/2009, 11/11/2009, 09/30/2011, 10/28/2012, 09/29/2013, 09/20/2014, 09/27/2015, 09/21/2019   Influenza,inj,Quad PF,6+ Mos 10/07/2019, 10/02/2020   Influenza-Unspecified 09/22/2015, 10/09/2016, 10/13/2017, 09/28/2018, 10/07/2019   PFIZER(Purple Top)SARS-COV-2 Vaccination 01/13/2020, 02/04/2020, 04/05/2021   Pfizer Covid-19 Vaccine Bivalent Booster 39yr & up 09/27/2021   Pneumococcal Polysaccharide-23 02/06/2021   Td 11/06/2001   Tdap 10/19/2011, 02/06/2021   Zoster Recombinat (Shingrix) 07/28/2019, 11/27/2019   Zoster, Live 12/09/2012   Last Pap smear: UTD through GYN  (Dr. MBenjie Karvonen Last mammogram: 10/2021 Last colonoscopy: 10/2017, Dr. MWatt Climes showed diverticulosis and diminutive polyp--path showed polypoid colonic mucosa with lymphoid aggregate. He recommended 5 year f/u. Last DEXA: [facility] 01/2022, normal, T-0.9 at R and L fem neck.  Ordered by GYN, Dr. MBenjie Karvonen Dentist: twice yearly   Ophtho: yearly  Exercise:   Nothing regular.  Has exercise bike, uses it sporadically. Up and down stairs 6-8x/day, throughout the day.  She believes she has a living will and healthcare power of attorney, but she plans to look at it again to see if anything needs to be changed.  Depression screen: negative Falls: none Mini-Cog  Other doctors caring for patient: GYN: Dr. MBenjie KarvonenGI: Dr. MWatt ClimesOphtho: Dr. STeodoro SprayPain: Dr. RNelva BushDerm: DLennie Odor [facility]   [facility], [place] reviewed and updated     ROS: The patient denies anorexia, fever, decreased hearing, ear pain, sore throat, breast concerns, chest pain,  palpitations, syncope, dyspnea on exertion, cough, swelling, nausea, vomiting, diarrhea, melena, hematochezia, indigestion/heartburn, hematuria, incontinence, dysuria, vaginal bleeding, discharge, odor or itch, genital lesions, numbness, tingling, weakness, suspicious skin lesions, anxiety, abnormal bleeding/bruising, or enlarged lymph nodes.  She sees dermatologist yearly for skin checks. Depression is stable/controlled, + work stress. Chronic back pain/arthritis, and neck pain, chiro helps Occasional bloating and gas (known HH/GERD); bowels are normal. Weight gain. No changes to hair/skin/nails   PHYSICAL EXAM:  There were no vitals taken for this visit.  Wt Readings from Last 3 Encounters:  03/04/22 185 lb 3.2 oz (84 kg)  02/06/21 189 lb 9.6 oz (86 kg)  01/31/20 184 lb 3.2 oz (83.6 kg)    Declines changing into gown--sees GYN and dermatologist regularly.   General Appearance:    Alert, cooperative, no distress, appears stated age     Head:    Normocephalic, without obvious abnormality, atraumatic     Eyes:    PERRL, conjunctiva/corneas clear, EOM's intact, fundi benign     Ears:    Normal TM's and external ear canals     Nose:    Not examined, wearing mask due to COVID-19 pandemic   Throat:    Not examined, wearing mask due to COVID-19 pandemic  Neck:    Supple,  no lymphadenopathy; thyroid: no enlargement/ tenderness/nodules; no carotid bruit or JVD     Back:    Spine nontender, no curvature, ROM normal, no CVA tenderness     Lungs:    Clear to auscultation bilaterally without wheezes, rales or ronchi; respirations unlabored     Chest Wall:    No tenderness or deformity     Heart:    Regular rate and rhythm, S1 and S2 normal, no murmur, rub or gallop     Breast Exam:    Deferred to GYN     Abdomen:    Soft, non-tender, nondistended, normoactive bowel sounds, no masses,  no hepatosplenomegaly     Genitalia:    Deferred to GYN            Extremities:    No clubbing, cyanosis or  edema. Eval of RLE is normal, nontender  Pulses:    2+ and symmetric all extremities     Skin:    Skin color, texture, turgor normal, no rashes or lesions. Limited exam (chose not to get into gown, sees derm)  Lymph nodes:    Cervical, supraclavicular axillary nodes normal     Neurologic:    Normal strength, sensation and gait; reflexes 2+ and symmetric throughout                          Psych:   Normal mood, affect, hygiene and grooming   (*** update nose/throat)  ASSESSMENT/PLAN:   A1c (option to draw with labs if she prefers) Lipids, LFT's, glu, Vitamin D  Prevnar-20 rec, may need to check with insurance since not on Medicare yet. Offer 2nd bivalent COVID booster.  Do fall screen PHQ-9 (due to h/o depression), mini-cog.  Do not need to do functional status survey.  Not medicare   No records from GYN, Dr. Benjie Karvonen.  When did she last go?  Pap?   Discussed monthly self breast exams and yearly mammograms; at least 30 minutes of aerobic activity at least 5 days/week, weight-bearing exercise at least 2x/week; proper sunscreen use reviewed; healthy diet, including goals of calcium and vitamin D intake and alcohol recommendations (less than or equal to 1 drink/day) reviewed; regular seatbelt use; changing batteries in smoke detectors. Immunization recommendations discussed-- Bivalent COVID booster? [id number]-66 recommended, should check insurance to verify coverage (is covered by medicare, but pt has Pharmacist, community) Continue yearly flu shots. Colonoscopy recommendations reviewed--UTD, due again 10/2022    F/u 1 year, sooner prn.

## 2022-05-13 NOTE — Patient Instructions (Incomplete)
  HEALTH MAINTENANCE RECOMMENDATIONS:  It is recommended that you get at least 30 minutes of aerobic exercise at least 5 days/week (for weight loss, you may need as much as 60-90 minutes). This can be any activity that gets your heart rate up. This can be divided in 10-15 minute intervals if needed, but try and build up your endurance at least once a week.  Weight bearing exercise is also recommended twice weekly.  Eat a healthy diet with lots of vegetables, fruits and fiber.  "Colorful" foods have a lot of vitamins (ie green vegetables, tomatoes, red peppers, etc).  Limit sweet tea, regular sodas and alcoholic beverages, all of which has a lot of calories and sugar.  Up to 1 alcoholic drink daily may be beneficial for women (unless trying to lose weight, watch sugars).  Drink a lot of water.  Calcium recommendations are 1200-1500 mg daily (1500 mg for postmenopausal women or women without ovaries), and vitamin D 1000 IU daily.  This should be obtained from diet and/or supplements (vitamins), and calcium should not be taken all at once, but in divided doses.  Monthly self breast exams and yearly mammograms for women over the age of 23 is recommended.  Sunscreen of at least SPF 30 should be used on all sun-exposed parts of the skin when outside between the hours of 10 am and 4 pm (not just when at beach or pool, but even with exercise, golf, tennis, and yard work!)  Use a sunscreen that says "broad spectrum" so it covers both UVA and UVB rays, and make sure to reapply every 1-2 hours.  Remember to change the batteries in your smoke detectors when changing your clock times in the spring and fall. Carbon monoxide detectors are recommended for your home.  Use your seat belt every time you are in a car, and please drive safely and not be distracted with cell phones and texting while driving.  HRCBULA-45 is recommended.  Since this is fairly new, and you aren't on Medicare (which does cover this), you  should double check with your insurance that this vaccine is covered.  Please contact the office to schedule a nurse visit to get this vaccine, if covered.  Try switching from Xyzal to Allegra or Claritin, since you are only able to tolerate 1/2 dose of the xyzal, and you still have some congestion.  If you notice increase in size of the nodule at your left forearm, please be sure to follow up (especially if rapidly increasing in size). The lump/cyst on the pinkie--if it isn't bothersome in anyway, you do not need to have this treated.  If it gets large or painful, then call the hand surgeons (and contact me if referral is needed).  Please bring [place] copies of your Living Will and [place] so that it can be scanned into your medical chart.  Increase flonase to 2 sprays into each nostril.  Be sure to take gentle sniffs only.

## 2022-05-14 ENCOUNTER — Ambulatory Visit: Payer: BC Managed Care – PPO | Admitting: Family Medicine

## 2022-05-14 ENCOUNTER — Encounter: Payer: Self-pay | Admitting: Family Medicine

## 2022-05-14 VITALS — BP 128/78 | HR 72 | Ht 65.5 in | Wt 188.6 lb

## 2022-05-14 DIAGNOSIS — E559 Vitamin D deficiency, unspecified: Secondary | ICD-10-CM | POA: Diagnosis not present

## 2022-05-14 DIAGNOSIS — E7800 Pure hypercholesterolemia, unspecified: Secondary | ICD-10-CM

## 2022-05-14 DIAGNOSIS — I10 Essential (primary) hypertension: Secondary | ICD-10-CM | POA: Diagnosis not present

## 2022-05-14 DIAGNOSIS — Z0000 Encounter for general adult medical examination without abnormal findings: Secondary | ICD-10-CM

## 2022-05-14 DIAGNOSIS — Z23 Encounter for immunization: Secondary | ICD-10-CM | POA: Diagnosis not present

## 2022-05-14 DIAGNOSIS — R7989 Other specified abnormal findings of blood chemistry: Secondary | ICD-10-CM | POA: Diagnosis not present

## 2022-05-14 DIAGNOSIS — R7301 Impaired fasting glucose: Secondary | ICD-10-CM | POA: Diagnosis not present

## 2022-05-14 DIAGNOSIS — M67449 Ganglion, unspecified hand: Secondary | ICD-10-CM

## 2022-05-14 DIAGNOSIS — F325 Major depressive disorder, single episode, in full remission: Secondary | ICD-10-CM

## 2022-05-14 DIAGNOSIS — J309 Allergic rhinitis, unspecified: Secondary | ICD-10-CM

## 2022-05-15 LAB — HEMOGLOBIN A1C
Est. average glucose Bld gHb Est-mCnc: 120 mg/dL
Hgb A1c MFr Bld: 5.8 % — ABNORMAL HIGH (ref 4.8–5.6)

## 2022-05-15 LAB — HEPATIC FUNCTION PANEL
ALT: 47 IU/L — ABNORMAL HIGH (ref 0–32)
AST: 41 IU/L — ABNORMAL HIGH (ref 0–40)
Albumin: 4.7 g/dL (ref 3.8–4.8)
Alkaline Phosphatase: 121 IU/L (ref 44–121)
Bilirubin Total: 0.3 mg/dL (ref 0.0–1.2)
Bilirubin, Direct: 0.1 mg/dL (ref 0.00–0.40)
Total Protein: 7.1 g/dL (ref 6.0–8.5)

## 2022-05-15 LAB — LIPID PANEL
Chol/HDL Ratio: 3.3 ratio (ref 0.0–4.4)
Cholesterol, Total: 215 mg/dL — ABNORMAL HIGH (ref 100–199)
HDL: 66 mg/dL (ref >39)
LDL Chol Calc (NIH): 130 mg/dL — ABNORMAL HIGH (ref 0–99)
Triglycerides: 109 mg/dL (ref 0–149)
VLDL Cholesterol Cal: 19 mg/dL (ref 5–40)

## 2022-05-15 LAB — VITAMIN D 25 HYDROXY (VIT D DEFICIENCY, FRACTURES): Vit D, 25-Hydroxy: 30.4 ng/mL (ref 30.0–100.0)

## 2022-05-15 LAB — GLUCOSE, RANDOM: Glucose: 94 mg/dL (ref 70–99)

## 2022-05-26 DIAGNOSIS — M62838 Other muscle spasm: Secondary | ICD-10-CM | POA: Diagnosis not present

## 2022-05-26 DIAGNOSIS — M9901 Segmental and somatic dysfunction of cervical region: Secondary | ICD-10-CM | POA: Diagnosis not present

## 2022-05-26 DIAGNOSIS — M542 Cervicalgia: Secondary | ICD-10-CM | POA: Diagnosis not present

## 2022-05-26 DIAGNOSIS — M9902 Segmental and somatic dysfunction of thoracic region: Secondary | ICD-10-CM | POA: Diagnosis not present

## 2022-05-28 DIAGNOSIS — M5030 Other cervical disc degeneration, unspecified cervical region: Secondary | ICD-10-CM | POA: Diagnosis not present

## 2022-05-28 DIAGNOSIS — M5136 Other intervertebral disc degeneration, lumbar region: Secondary | ICD-10-CM | POA: Diagnosis not present

## 2022-05-28 DIAGNOSIS — Z79899 Other long term (current) drug therapy: Secondary | ICD-10-CM | POA: Diagnosis not present

## 2022-05-28 DIAGNOSIS — Z5181 Encounter for therapeutic drug level monitoring: Secondary | ICD-10-CM | POA: Diagnosis not present

## 2022-06-08 DIAGNOSIS — M9901 Segmental and somatic dysfunction of cervical region: Secondary | ICD-10-CM | POA: Diagnosis not present

## 2022-06-08 DIAGNOSIS — M542 Cervicalgia: Secondary | ICD-10-CM | POA: Diagnosis not present

## 2022-06-08 DIAGNOSIS — M9902 Segmental and somatic dysfunction of thoracic region: Secondary | ICD-10-CM | POA: Diagnosis not present

## 2022-06-08 DIAGNOSIS — M62838 Other muscle spasm: Secondary | ICD-10-CM | POA: Diagnosis not present

## 2022-06-10 DIAGNOSIS — M62838 Other muscle spasm: Secondary | ICD-10-CM | POA: Diagnosis not present

## 2022-06-10 DIAGNOSIS — M9901 Segmental and somatic dysfunction of cervical region: Secondary | ICD-10-CM | POA: Diagnosis not present

## 2022-06-10 DIAGNOSIS — M542 Cervicalgia: Secondary | ICD-10-CM | POA: Diagnosis not present

## 2022-06-10 DIAGNOSIS — M9902 Segmental and somatic dysfunction of thoracic region: Secondary | ICD-10-CM | POA: Diagnosis not present

## 2022-07-16 ENCOUNTER — Other Ambulatory Visit: Payer: Self-pay | Admitting: Gastroenterology

## 2022-07-16 DIAGNOSIS — K5900 Constipation, unspecified: Secondary | ICD-10-CM | POA: Diagnosis not present

## 2022-07-16 DIAGNOSIS — R1011 Right upper quadrant pain: Secondary | ICD-10-CM

## 2022-07-16 DIAGNOSIS — K581 Irritable bowel syndrome with constipation: Secondary | ICD-10-CM | POA: Diagnosis not present

## 2022-07-23 ENCOUNTER — Ambulatory Visit
Admission: RE | Admit: 2022-07-23 | Discharge: 2022-07-23 | Disposition: A | Discharge status: Home / Self Care | Payer: BC Managed Care – PPO | Source: Ambulatory Visit | Attending: Gastroenterology | Admitting: Gastroenterology

## 2022-07-23 DIAGNOSIS — R1011 Right upper quadrant pain: Secondary | ICD-10-CM | POA: Diagnosis not present

## 2022-08-26 ENCOUNTER — Encounter: Payer: Self-pay | Admitting: Internal Medicine

## 2022-09-14 DIAGNOSIS — M9905 Segmental and somatic dysfunction of pelvic region: Secondary | ICD-10-CM | POA: Diagnosis not present

## 2022-09-14 DIAGNOSIS — M545 Low back pain, unspecified: Secondary | ICD-10-CM | POA: Diagnosis not present

## 2022-09-14 DIAGNOSIS — M9904 Segmental and somatic dysfunction of sacral region: Secondary | ICD-10-CM | POA: Diagnosis not present

## 2022-09-14 DIAGNOSIS — M9903 Segmental and somatic dysfunction of lumbar region: Secondary | ICD-10-CM | POA: Diagnosis not present

## 2022-09-15 ENCOUNTER — Telehealth: Payer: Self-pay | Admitting: Licensed Clinical Social Worker

## 2022-09-15 NOTE — Patient Instructions (Signed)
Visit Information  Thank you for taking time to visit with me today. Please don't hesitate to contact me if I can be of assistance to you.   Following are the goals we discussed today:   Goals Addressed             This Visit's Progress    COMPLETED: Care Coordination Activities-LCSW No Follow Up       Care Coordination Interventions: Active listening / Reflection utilized  Emotional Support Provided LCSW informed patient of care coordination services. Pt is not interested at this time and agreed to contact PCP, should needs arise Pt was informed flu vaccines are available. Pt shared interest in obtaining flu shot and agreed to contact PCP office       If you are experiencing a Mental Health or [place] or need someone to talk to, please call the Suicide and Crisis Lifeline: 988 call 911   Patient verbalizes understanding of instructions and care plan provided today and agrees to view in [place]. Active MyChart status and patient understanding of how to access instructions and care plan via MyChart confirmed with patient.     No further follow up required:    Amanda Baldwin, MSW, [email or web address] Phone [phone] 2:35 PM

## 2022-09-15 NOTE — Patient Outreach (Signed)
  Care Coordination   Initial Visit Note   09/15/2022 Name: Amanda Baldwin MRN: [id number] DOB: [birth year]  Amanda Baldwin is a 66 y.o. year old female who sees Rita Ohara, MD for primary care. I spoke with  Darin Engels by phone today.  What matters to the patients health and wellness today?  Care Coordination    Goals Addressed             This Visit's Progress    COMPLETED: Care Coordination Activities-LCSW No Follow Up       Care Coordination Interventions: Active listening / Reflection utilized  Emotional Support Provided LCSW informed patient of care coordination services. Pt is not interested at this time and agreed to contact PCP, should needs arise Pt was informed flu vaccines are available. Pt shared interest in obtaining flu shot and agreed to contact PCP office        SDOH assessments and interventions completed:  No     Care Coordination Interventions Activated:  Yes  Care Coordination Interventions:  Yes, provided   Follow up plan: No further intervention required.   Encounter Outcome:  Pt. Refused   Christa See, MSW, [email or web address] Phone [phone] 2:35 PM

## 2022-09-16 DIAGNOSIS — M9904 Segmental and somatic dysfunction of sacral region: Secondary | ICD-10-CM | POA: Diagnosis not present

## 2022-09-16 DIAGNOSIS — M9905 Segmental and somatic dysfunction of pelvic region: Secondary | ICD-10-CM | POA: Diagnosis not present

## 2022-09-16 DIAGNOSIS — M545 Low back pain, unspecified: Secondary | ICD-10-CM | POA: Diagnosis not present

## 2022-09-16 DIAGNOSIS — M9903 Segmental and somatic dysfunction of lumbar region: Secondary | ICD-10-CM | POA: Diagnosis not present

## 2022-09-22 DIAGNOSIS — M9904 Segmental and somatic dysfunction of sacral region: Secondary | ICD-10-CM | POA: Diagnosis not present

## 2022-09-22 DIAGNOSIS — M545 Low back pain, unspecified: Secondary | ICD-10-CM | POA: Diagnosis not present

## 2022-09-22 DIAGNOSIS — M9903 Segmental and somatic dysfunction of lumbar region: Secondary | ICD-10-CM | POA: Diagnosis not present

## 2022-09-22 DIAGNOSIS — M9905 Segmental and somatic dysfunction of pelvic region: Secondary | ICD-10-CM | POA: Diagnosis not present

## 2022-09-29 ENCOUNTER — Encounter: Payer: Self-pay | Admitting: Internal Medicine

## 2022-09-30 DIAGNOSIS — M9905 Segmental and somatic dysfunction of pelvic region: Secondary | ICD-10-CM | POA: Diagnosis not present

## 2022-09-30 DIAGNOSIS — M9904 Segmental and somatic dysfunction of sacral region: Secondary | ICD-10-CM | POA: Diagnosis not present

## 2022-09-30 DIAGNOSIS — M9903 Segmental and somatic dysfunction of lumbar region: Secondary | ICD-10-CM | POA: Diagnosis not present

## 2022-09-30 DIAGNOSIS — M545 Low back pain, unspecified: Secondary | ICD-10-CM | POA: Diagnosis not present

## 2022-10-01 DIAGNOSIS — M5030 Other cervical disc degeneration, unspecified cervical region: Secondary | ICD-10-CM | POA: Diagnosis not present

## 2022-10-01 DIAGNOSIS — M5136 Other intervertebral disc degeneration, lumbar region: Secondary | ICD-10-CM | POA: Diagnosis not present

## 2022-10-16 DIAGNOSIS — K649 Unspecified hemorrhoids: Secondary | ICD-10-CM | POA: Diagnosis not present

## 2022-10-16 DIAGNOSIS — Z83719 Family history of colon polyps, unspecified: Secondary | ICD-10-CM | POA: Diagnosis not present

## 2022-10-16 DIAGNOSIS — K5730 Diverticulosis of large intestine without perforation or abscess without bleeding: Secondary | ICD-10-CM | POA: Diagnosis not present

## 2022-10-16 DIAGNOSIS — Z1211 Encounter for screening for malignant neoplasm of colon: Secondary | ICD-10-CM | POA: Diagnosis not present

## 2022-10-16 LAB — HM COLONOSCOPY

## 2022-10-19 ENCOUNTER — Other Ambulatory Visit: Payer: Self-pay | Admitting: Gastroenterology

## 2022-10-19 ENCOUNTER — Ambulatory Visit
Admission: RE | Admit: 2022-10-19 | Discharge: 2022-10-19 | Disposition: A | Discharge status: Home / Self Care | Payer: BC Managed Care – PPO | Source: Ambulatory Visit | Attending: Gastroenterology | Admitting: Gastroenterology

## 2022-10-19 DIAGNOSIS — R109 Unspecified abdominal pain: Secondary | ICD-10-CM

## 2022-10-19 DIAGNOSIS — K5900 Constipation, unspecified: Secondary | ICD-10-CM | POA: Diagnosis not present

## 2022-11-11 DIAGNOSIS — Z1231 Encounter for screening mammogram for malignant neoplasm of breast: Secondary | ICD-10-CM | POA: Diagnosis not present

## 2022-11-11 LAB — HM MAMMOGRAPHY

## 2022-11-18 ENCOUNTER — Encounter: Payer: Self-pay | Admitting: *Deleted

## 2022-12-10 ENCOUNTER — Encounter: Payer: Self-pay | Admitting: Family Medicine

## 2022-12-11 DIAGNOSIS — H43391 Other vitreous opacities, right eye: Secondary | ICD-10-CM | POA: Diagnosis not present

## 2023-01-21 DIAGNOSIS — D2261 Melanocytic nevi of right upper limb, including shoulder: Secondary | ICD-10-CM | POA: Diagnosis not present

## 2023-01-21 DIAGNOSIS — L821 Other seborrheic keratosis: Secondary | ICD-10-CM | POA: Diagnosis not present

## 2023-01-21 DIAGNOSIS — C44622 Squamous cell carcinoma of skin of right upper limb, including shoulder: Secondary | ICD-10-CM | POA: Diagnosis not present

## 2023-01-21 DIAGNOSIS — D2271 Melanocytic nevi of right lower limb, including hip: Secondary | ICD-10-CM | POA: Diagnosis not present

## 2023-01-21 DIAGNOSIS — Z08 Encounter for follow-up examination after completed treatment for malignant neoplasm: Secondary | ICD-10-CM | POA: Diagnosis not present

## 2023-01-21 DIAGNOSIS — L570 Actinic keratosis: Secondary | ICD-10-CM | POA: Diagnosis not present

## 2023-01-21 DIAGNOSIS — Z85820 Personal history of malignant melanoma of skin: Secondary | ICD-10-CM | POA: Diagnosis not present

## 2023-01-21 DIAGNOSIS — D225 Melanocytic nevi of trunk: Secondary | ICD-10-CM | POA: Diagnosis not present

## 2023-01-21 DIAGNOSIS — D485 Neoplasm of uncertain behavior of skin: Secondary | ICD-10-CM | POA: Diagnosis not present

## 2023-01-21 DIAGNOSIS — D2272 Melanocytic nevi of left lower limb, including hip: Secondary | ICD-10-CM | POA: Diagnosis not present

## 2023-01-21 DIAGNOSIS — L814 Other melanin hyperpigmentation: Secondary | ICD-10-CM | POA: Diagnosis not present

## 2023-01-25 DIAGNOSIS — M9901 Segmental and somatic dysfunction of cervical region: Secondary | ICD-10-CM | POA: Diagnosis not present

## 2023-01-25 DIAGNOSIS — M5031 Other cervical disc degeneration,  high cervical region: Secondary | ICD-10-CM | POA: Diagnosis not present

## 2023-01-28 DIAGNOSIS — M5031 Other cervical disc degeneration,  high cervical region: Secondary | ICD-10-CM | POA: Diagnosis not present

## 2023-01-28 DIAGNOSIS — M9901 Segmental and somatic dysfunction of cervical region: Secondary | ICD-10-CM | POA: Diagnosis not present

## 2023-02-02 DIAGNOSIS — M5136 Other intervertebral disc degeneration, lumbar region: Secondary | ICD-10-CM | POA: Diagnosis not present

## 2023-02-02 DIAGNOSIS — M9901 Segmental and somatic dysfunction of cervical region: Secondary | ICD-10-CM | POA: Diagnosis not present

## 2023-02-02 DIAGNOSIS — M5031 Other cervical disc degeneration,  high cervical region: Secondary | ICD-10-CM | POA: Diagnosis not present

## 2023-02-02 DIAGNOSIS — M5030 Other cervical disc degeneration, unspecified cervical region: Secondary | ICD-10-CM | POA: Diagnosis not present

## 2023-02-03 DIAGNOSIS — R69 Illness, unspecified: Secondary | ICD-10-CM | POA: Diagnosis not present

## 2023-02-10 DIAGNOSIS — M5031 Other cervical disc degeneration,  high cervical region: Secondary | ICD-10-CM | POA: Diagnosis not present

## 2023-02-10 DIAGNOSIS — M9901 Segmental and somatic dysfunction of cervical region: Secondary | ICD-10-CM | POA: Diagnosis not present

## 2023-02-15 DIAGNOSIS — M542 Cervicalgia: Secondary | ICD-10-CM | POA: Diagnosis not present

## 2023-02-17 DIAGNOSIS — M5031 Other cervical disc degeneration,  high cervical region: Secondary | ICD-10-CM | POA: Diagnosis not present

## 2023-02-17 DIAGNOSIS — M9901 Segmental and somatic dysfunction of cervical region: Secondary | ICD-10-CM | POA: Diagnosis not present

## 2023-02-22 DIAGNOSIS — R69 Illness, unspecified: Secondary | ICD-10-CM | POA: Diagnosis not present

## 2023-02-26 DIAGNOSIS — M5136 Other intervertebral disc degeneration, lumbar region: Secondary | ICD-10-CM | POA: Diagnosis not present

## 2023-02-26 DIAGNOSIS — R253 Fasciculation: Secondary | ICD-10-CM | POA: Diagnosis not present

## 2023-02-26 DIAGNOSIS — M5030 Other cervical disc degeneration, unspecified cervical region: Secondary | ICD-10-CM | POA: Diagnosis not present

## 2023-03-01 DIAGNOSIS — M9901 Segmental and somatic dysfunction of cervical region: Secondary | ICD-10-CM | POA: Diagnosis not present

## 2023-03-01 DIAGNOSIS — M5031 Other cervical disc degeneration,  high cervical region: Secondary | ICD-10-CM | POA: Diagnosis not present

## 2023-03-08 DIAGNOSIS — H52223 Regular astigmatism, bilateral: Secondary | ICD-10-CM | POA: Diagnosis not present

## 2023-03-08 DIAGNOSIS — H5213 Myopia, bilateral: Secondary | ICD-10-CM | POA: Diagnosis not present

## 2023-03-08 DIAGNOSIS — H40013 Open angle with borderline findings, low risk, bilateral: Secondary | ICD-10-CM | POA: Diagnosis not present

## 2023-03-08 DIAGNOSIS — H524 Presbyopia: Secondary | ICD-10-CM | POA: Diagnosis not present

## 2023-03-24 DIAGNOSIS — R69 Illness, unspecified: Secondary | ICD-10-CM | POA: Diagnosis not present

## 2023-03-24 DIAGNOSIS — M5031 Other cervical disc degeneration,  high cervical region: Secondary | ICD-10-CM | POA: Diagnosis not present

## 2023-03-24 DIAGNOSIS — M9901 Segmental and somatic dysfunction of cervical region: Secondary | ICD-10-CM | POA: Diagnosis not present

## 2023-04-07 ENCOUNTER — Encounter: Payer: Self-pay | Admitting: Family Medicine

## 2023-04-07 ENCOUNTER — Ambulatory Visit: Payer: Medicare HMO | Admitting: Family Medicine

## 2023-04-07 DIAGNOSIS — S8263XA Displaced fracture of lateral malleolus of unspecified fibula, initial encounter for closed fracture: Secondary | ICD-10-CM

## 2023-04-07 DIAGNOSIS — M25571 Pain in right ankle and joints of right foot: Secondary | ICD-10-CM | POA: Diagnosis not present

## 2023-04-07 DIAGNOSIS — S8002XA Contusion of left knee, initial encounter: Secondary | ICD-10-CM | POA: Diagnosis not present

## 2023-04-07 DIAGNOSIS — S8264XA Nondisplaced fracture of lateral malleolus of right fibula, initial encounter for closed fracture: Secondary | ICD-10-CM | POA: Diagnosis not present

## 2023-04-07 HISTORY — DX: Displaced fracture of lateral malleolus of unspecified fibula, initial encounter for closed fracture: S82.63XA

## 2023-04-12 ENCOUNTER — Other Ambulatory Visit: Payer: Self-pay | Admitting: *Deleted

## 2023-04-12 MED ORDER — LISINOPRIL 20 MG PO TABS: 20.0000 mg | ORAL_TABLET | Freq: Every day | ORAL | 0 refills | Status: DC

## 2023-04-15 ENCOUNTER — Ambulatory Visit
Admission: RE | Admit: 2023-04-15 | Discharge: 2023-04-15 | Disposition: A | Discharge status: Home / Self Care | Payer: Medicare HMO | Source: Ambulatory Visit | Attending: Orthopaedic Surgery | Admitting: Orthopaedic Surgery

## 2023-04-15 ENCOUNTER — Encounter: Payer: Self-pay | Admitting: Family Medicine

## 2023-04-15 ENCOUNTER — Other Ambulatory Visit: Payer: Self-pay | Admitting: Orthopaedic Surgery

## 2023-04-15 ENCOUNTER — Encounter: Payer: Self-pay | Admitting: Orthopaedic Surgery

## 2023-04-15 DIAGNOSIS — S8261XA Displaced fracture of lateral malleolus of right fibula, initial encounter for closed fracture: Secondary | ICD-10-CM

## 2023-04-15 DIAGNOSIS — M7989 Other specified soft tissue disorders: Secondary | ICD-10-CM | POA: Diagnosis not present

## 2023-04-15 DIAGNOSIS — M25571 Pain in right ankle and joints of right foot: Secondary | ICD-10-CM | POA: Diagnosis not present

## 2023-04-20 ENCOUNTER — Ambulatory Visit: Payer: Medicare HMO | Admitting: Neurology

## 2023-04-26 ENCOUNTER — Ambulatory Visit: Payer: Medicare HMO | Admitting: Neurology

## 2023-05-07 ENCOUNTER — Encounter: Payer: Self-pay | Admitting: Family Medicine

## 2023-05-07 DIAGNOSIS — F325 Major depressive disorder, single episode, in full remission: Secondary | ICD-10-CM

## 2023-05-07 MED ORDER — SERTRALINE HCL 100 MG PO TABS: ORAL_TABLET | ORAL | 0 refills | Status: DC

## 2023-05-10 ENCOUNTER — Encounter: Payer: Self-pay | Admitting: Neurology

## 2023-05-10 ENCOUNTER — Ambulatory Visit: Payer: Medicare HMO | Admitting: Neurology

## 2023-05-10 VITALS — BP 169/81 | HR 59 | Ht 66.0 in | Wt 165.0 lb

## 2023-05-10 DIAGNOSIS — G5131 Clonic hemifacial spasm, right: Secondary | ICD-10-CM | POA: Diagnosis not present

## 2023-05-10 DIAGNOSIS — G250 Essential tremor: Secondary | ICD-10-CM | POA: Diagnosis not present

## 2023-05-10 NOTE — Progress Notes (Signed)
Chief Complaint  Patient presents with   New Patient (Initial Visit)    Rm 15. Alone. NP proficient paper referral for facial twitching/Dr. Eliott Nine. Facial twitching began one year ago, worse in the morning and with stress. In December she noticed when turning her head to the left she has neck tingling.      ASSESSMENT AND PLAN  Amanda Baldwin is a 67 y.o. female   Right eye twitching,  Suggested for right hemifacial spasm, only intermittent, does not limit her function, discussed treatment option including botulism toxin injection, MRI of the brain for evaluation to rule out right facial nerve compression, decided not to proceed  Essential tremor  Check TSH at next yearly checkup with primary care  DIAGNOSTIC DATA (LABS, IMAGING, TESTING) - I reviewed patient records, labs, notes, testing and imaging myself where available.  MRI of cervical spine February 15, 2023, moderate severe left and mild right C6 5 6 neuroforaminal narrowing, impinging upon exiting left C6 nerve root, mild to moderate left mild right subscapular xeomin narrowing with mild canal stenosis, minimal deformity of the cord, small to moderate broad-based central left disc protrusion superimposed upon bulging disc osteophyte complex, but no significant canal or foraminal narrowing, severe left C2-3 facet arthrosis, facet ankylosis, bilateral C3-4 facet ankylosis and hypertrophy, right facet ankylosis hypertrophy without foraminal narrowing   MEDICAL HISTORY:  Amanda Baldwin is a 67 year old female, seen in request by Dr. Sheran Luz, for evaluation of intermittent right facial twitching, her primary care physician is Dr., Lynelle Doctor, [place],    I reviewed and summarized the referring note. PMHX. Depression. GERD HTN   She had a history of bilateral TMJ joint disc replacement in the past, recovered very well, around 2023, she noticed intermittent right facial twitching, mainly around her right eye, right  mouth corner,  Her intermittent right facial preaching is present less than 20% of the day, does not really affecting her daily function,  She denies visual loss no hearing loss no facial sensory changes.  In addition, she complains of neck pain,  Noted [facility] evaluation by Dr. Ethelene Hal on February 26, 2023, MRI of the cervical spine February 2024 reviewed degenerative disc disease at C5-6, C6-7, with mild canal stenosis, no significant foraminal stenosis,  Long history of intermittent hand shaking, symmetric getting worse, difficulty drawing spiral circle, maternal aunt suffered similar hand shaking   PHYSICAL EXAM:   Today's Vitals   05/10/23 1121  BP: (!) 169/81  Pulse: (!) 59  Weight: 165 lb (74.8 kg)  Height: 5\' 6"  (1.676 m)   Body mass index is 26.63 kg/m.  PHYSICAL EXAMNIATION:  Gen: NAD, conversant, well nourised, well groomed                     Cardiovascular: Regular rate rhythm, no peripheral edema, warm, nontender. Eyes: Conjunctivae clear without exudates or hemorrhage Neck: Supple, no carotid bruits. Pulmonary: Clear to auscultation bilaterally   NEUROLOGICAL EXAM:  MENTAL STATUS: Speech/cognition: Awake, alert, oriented to history taking and casual conversation CRANIAL NERVES: CN II: Visual fields are full to confrontation. Pupils are round equal and briskly reactive to light. CN III, IV, VI: extraocular movement are normal. No ptosis. CN V: Facial sensation is intact to light touch, bilateral corneal reflexes were normal and symmetric CN VII: Face is symmetric with normal eye closure  CN VIII: Hearing is normal to causal conversation. CN IX, X: Phonation is normal. CN XI: Head turning and shoulder shrug are  intact  MOTOR: Mild bilateral hand posturing tremor, no weakness, difficulty drawing spiral circle, no rigidity no bradykinesia  REFLEXES: Reflexes are 2+ and symmetric at the biceps, triceps, knees, and ankles. Plantar responses are  flexor.  SENSORY: Intact to light touch, pinprick and vibratory sensation are intact in fingers and toes.  COORDINATION: There is no trunk or limb dysmetria noted.  GAIT/STANCE: Posture is normal. Gait is steady with normal steps, base, arm swing, and turning. Heel and toe walking are normal. Tandem gait is normal.  Romberg is absent.  REVIEW OF SYSTEMS:  Full 14 system review of systems performed and notable only for as above All other review of systems were negative.   ALLERGIES: Allergies  Allergen Reactions   Sulfamethoxazole-Trimethoprim Hives and Other (See Comments)    Other reaction(s): rash, joint pain, fever   Darvocet [Propoxyphene N-Acetaminophen] Other (See Comments)    Faint, blood pressure goes sky high.   Etodolac Other (See Comments)    Red face, arms-looked like bad sunburn.   Gabapentin Other (See Comments)    tremors   Septra [Bactrim] Hives    HOME MEDICATIONS: Current Outpatient Medications  Medication Sig Dispense Refill   acetaminophen-codeine (TYLENOL #3) 300-30 MG per tablet Take 1 tablet by mouth every 6 (six) hours as needed for moderate pain.     ascorbic acid (VITAMIN C) 500 MG tablet Take 1,000 mg by mouth daily.     B Complex-C (SUPER B COMPLEX PO) Take 1 capsule by mouth daily.     cholecalciferol (VITAMIN D) 1000 units tablet Take 2,000 Units by mouth daily.     dicyclomine (BENTYL) 10 MG capsule Take 10 mg by mouth 3 (three) times daily before meals.     docusate sodium (COLACE) 100 MG capsule Take 100 mg by mouth 2 (two) times daily.     fexofenadine (ALLEGRA) 180 MG tablet Take 180 mg by mouth daily.     fluticasone (FLONASE) 50 MCG/ACT nasal spray Place 2 sprays into both nostrils daily. Reported on 01/20/2016 16 g 11   ibuprofen (ADVIL,MOTRIN) 200 MG tablet Take 200-800 mg by mouth every 6 (six) hours as needed for headache or mild pain.     ketotifen (ZADITOR) 0.025 % ophthalmic solution Place 1 drop into both eyes every morning.      Lifitegrast 5 % SOLN Place 1 drop into both eyes 2 (two) times daily.      lisinopril (ZESTRIL) 20 MG tablet Take 1 tablet (20 mg total) by mouth daily. 30 tablet 0   Magnesium 250 MG TABS Take 1 tablet by mouth every evening.     methocarbamol (ROBAXIN) 500 MG tablet Take 500 mg by mouth every 8 (eight) hours as needed (neck pain). Reported on 01/20/2016     Multiple Vitamin (MULTIVITAMIN) tablet Take 1 tablet by mouth daily.     pantoprazole (PROTONIX) 40 MG tablet Take 40 mg by mouth daily.     polyethylene glycol (MIRALAX / GLYCOLAX) packet Take 17 g by mouth daily as needed for mild constipation or moderate constipation.     sertraline (ZOLOFT) 100 MG tablet TAKE 1 TABLET (100 MG TOTAL) BY MOUTH DAILY. 30 tablet 0   simethicone (MYLICON) 125 MG chewable tablet Chew 125 mg by mouth every 6 (six) hours as needed for flatulence.     traMADol (ULTRAM) 50 MG tablet Take 50 mg by mouth every 8 (eight) hours.     No current facility-administered medications for this visit.    PAST MEDICAL  HISTORY: Past Medical History:  Diagnosis Date   Allergic rhinitis, cause unspecified    seasonal   C. difficile colitis 09/24/2017   treated with oral vancomycin per [facility].    Chronic constipation    Chronic pain    f/b Dr. Ramos--multilevel cervical DDD, disk bulge L5-S1   Closed fracture of distal lateral malleolus 04/07/2023   right--treated by Dr. Odis Hollingshead at [facility]   Colitis 08/29/2017   treated with Cipro and Flagyl. ( [facility])    Depression    h/o hospitalization '87   Elevated liver function tests    normal work-up; some fatty liver on u/s.  mild elevations intermittently   Hemorrhoids 11/2007   internal and external   Hiatal hernia    Hyperlipidemia 10/2006   good HDL   Hypertension    Melanoma of thigh (HCC) 1999   R thigh, level 1 (Dr. Terri Piedra)   Plantar fasciitis, bilateral    L worse than R; Dr. Charlsie Merles (in past)   Reflux    Shingles 11/2018   Vitamin D deficiency 2010     PAST SURGICAL HISTORY: Past Surgical History:  Procedure Laterality Date   BREAST SURGERY  1987, 1998   Right breast--removal of benign tumor and benign cyst   COLONOSCOPY  11/2007, 11/2012   Dr. Ewing Schlein   DILATION AND CURETTAGE OF UTERUS  2006   bleeding (benign)   ESOPHAGOGASTRODUODENOSCOPY  11/2012   Dr. Ewing Schlein   excision of melanoma  1999   R thigh   NASAL SINUS SURGERY  1995, 2002   SHOULDER SURGERY  03-2011   Right; bone spur removal (Dr. Thomasena Edis)   [facility] JOINT SURGERY  1987, 1989   artificial disks in TMJ; Dr. Warren Danes    FAMILY HISTORY: Family History  Problem Relation Age of Onset   Hypertension Mother    Allergies Mother    Hyperlipidemia Mother    Arthritis Mother    Melanoma Mother    Cancer Mother        uterine cancer and melanoma   Heart disease Mother        atrial fibrillation   Glaucoma Father    Depression Father        (per daughter, never treated)   Cancer Father        prostate cancer, diagnosed in 63's   Colon polyps Father    Glaucoma Sister    Stroke Maternal Grandmother    Stroke Maternal Grandfather    Cancer Maternal Aunt        breast cancer in 40's   Heart disease Maternal Aunt    Dementia Maternal Aunt        late 80's   Heart disease Maternal Uncle    Heart disease Maternal Uncle    Diabetes Neg Hx     SOCIAL HISTORY: Social History   Socioeconomic History   Marital status: Single    Spouse name: Not on file   Number of children: 0   Years of education: Not on file   Highest education level: Bachelor's degree (e.g., BA, AB, BS)  Occupational History   Occupation: pension Buyer, retail: [employer]  Tobacco Use   Smoking status: Never   Smokeless tobacco: Never  Vaping Use   Vaping Use: Never used  Substance and Sexual Activity   Alcohol use: Yes    Comment: 3 glass of wine per year.   Drug use: No   Sexual activity: Not Currently  Other  Topics Concern   Not on file  Social History Narrative    Lives alone.  2 cats..  Works for [employer].   Plans to retire end of 2023      Updated 04/2022   Social Determinants of Health   Financial Resource Strain: Low Risk  (03/03/2022)   Overall Financial Resource Strain (CARDIA)    Difficulty of Paying Living Expenses: Not hard at all  Food Insecurity: No Food Insecurity (03/03/2022)   Hunger Vital Sign    Worried About Running Out of Food in the Last Year: Never true    Ran Out of Food in the Last Year: Never true  Transportation Needs: No Transportation Needs (03/03/2022)   PRAPARE - Administrator, Civil Service (Medical): No    Lack of Transportation (Non-Medical): No  Physical Activity: Insufficiently Active (03/03/2022)   Exercise Vital Sign    Days of Exercise per Week: 1 day    Minutes of Exercise per Session: 10 min  Stress: No Stress Concern Present (03/03/2022)   [employer] of Occupational Health - Occupational Stress Questionnaire    Feeling of Stress : Only a little  Social Connections: Unknown (03/03/2022)   Social Connection and Isolation Panel [NHANES]    Frequency of Communication with Friends and Family: More than three times a week    Frequency of Social Gatherings with Friends and Family: Three times a week    Attends Religious Services: Patient declined    Active Member of Clubs or Organizations: No    Attends Banker Meetings: Not on file    Marital Status: Never married  Intimate Partner Violence: Not on file      Levert Feinstein, M.D. Ph.D.  [facility] [address] [place], [place] [address] Ph: [phone] Fax: [phone]  CC:  Sheran Luz, MD [address] [place],  [place] [address]  Joselyn Arrow, MD

## 2023-05-12 ENCOUNTER — Other Ambulatory Visit: Payer: Self-pay | Admitting: Family Medicine

## 2023-05-13 DIAGNOSIS — S8261XA Displaced fracture of lateral malleolus of right fibula, initial encounter for closed fracture: Secondary | ICD-10-CM | POA: Diagnosis not present

## 2023-05-18 NOTE — Patient Instructions (Incomplete)
HEALTH MAINTENANCE RECOMMENDATIONS:  It is recommended that you get at least 30 minutes of aerobic exercise at least 5 days/week (for weight loss, you may need as much as 60-90 minutes). This can be any activity that gets your heart rate up. This can be divided in 10-15 minute intervals if needed, but try and build up your endurance at least once a week.  Weight bearing exercise is also recommended twice weekly.  Eat a healthy diet with lots of vegetables, fruits and fiber.  "Colorful" foods have a lot of vitamins (ie green vegetables, tomatoes, red peppers, etc).  Limit sweet tea, regular sodas and alcoholic beverages, all of which has a lot of calories and sugar.  Up to 1 alcoholic drink daily may be beneficial for women (unless trying to lose weight, watch sugars).  Drink a lot of water.  Calcium recommendations are 1200-1500 mg daily (1500 mg for postmenopausal women or women without ovaries), and vitamin D 1000 IU daily.  This should be obtained from diet and/or supplements (vitamins), and calcium should not be taken all at once, but in divided doses.  Monthly self breast exams and yearly mammograms for women over the age of 55 is recommended.  Sunscreen of at least SPF 30 should be used on all sun-exposed parts of the skin when outside between the hours of 10 am and 4 pm (not just when at beach or pool, but even with exercise, golf, tennis, and yard work!)  Use a sunscreen that says "broad spectrum" so it covers both UVA and UVB rays, and make sure to reapply every 1-2 hours.  Remember to change the batteries in your smoke detectors when changing your clock times in the spring and fall. Carbon monoxide detectors are recommended for your home.  Use your seat belt every time you are in a car, and please drive safely and not be distracted with cell phones and texting while driving.   [name] , Thank you for taking time to come for your Welcome to Medicare Visit. I appreciate your ongoing  commitment to your health goals. Please review the following plan we discussed and let me know if I can assist you in the future.   This is a list of the screening recommended for you and due dates:  Health Maintenance  Topic Date Due   Medicare Annual Wellness Visit  Never done   Pneumonia Vaccine (2 of 2 - PCV) 02/06/2022   COVID-19 Vaccine (5 - 2023-24 season) 08/21/2022   Colon Cancer Screening  11/08/2022   Flu Shot  07/22/2023   Mammogram  11/11/2024   DTaP/Tdap/Td vaccine (4 - Td or Tdap) 02/06/2031   DEXA scan (bone density measurement)  Completed   Hepatitis C Screening  Completed   Zoster (Shingles) Vaccine  Completed   HPV Vaccine  Aged Out   I recommend getting the RSV vaccine from the pharmacy in the Fall.  You can get this along with your flu shot. COVID boosters should be considered.  Please bring [place] copies of your Living Will and Healthcare Power of Attorney once completed and notarized so that it can be scanned into your medical chart.  Consider slowly tapering the sertraline, as your other medical issues improve. Slowly get down to 50mg  daily and hold it there for a while. If you notice any increased depression or anxiety, go back up to the prior dose.  Please periodically monitor your blood pressure. You can check with your eye doctor regarding the safety of  using flonase for allergies. If not recommended, stick with the allegra daily, as well as saline rinses and mucinex as needed.  If needed, there are additional allergy medications that can be used.  You may want to give the meloxicam another try--it takes a few days for the full anti-inflammatory effect to take place.  You are allowed to take tylenol during the day, just not the ibuprofen. Don't exceed 3000 mg of tylenol in 1 day (including what you get from the tylenol #3).

## 2023-05-18 NOTE — Progress Notes (Signed)
Chief Complaint  Patient presents with   Medicare Wellness    Welcome to Medicare, fasting visit. Did have Prevnar in Dec and flu. She will get RSV in the fall. Declined covid booster. Dr. Terrace Arabia would like TSH done today.     Amanda Baldwin is a 67 y.o. female who presents for Welcome to Medicare visit and follow-up on chronic medical conditions.   Since her last visit, she has retired.  She fractured her R ankle in April after a fall down the stairs (related to her cat). She is out of the boot, in an ankle stabilizer. Today is the first day she can drive!  She was seen in March 2023 with facial twitching, a drawing up of her lip when she closed her eyes (R side).  She had CT and was referred to neurology. She had reported improvement in this after seeing her chiropractor (told it was C1), so held off on scheduling appointment with the neurologist. She reported in November just an occasional "almost" twitch, a slight twinge, on the left.   Sometimes when she turns her head to the left, she gets tingling on the back of the R side of her neck to her scalp. She saw Dr. Ethelene Hal for this. She had neck MRI done. She reports it showed DDD.  He referred her to neuro for facial twitch.  No treatment recommended for the tingling. She has neck stiffness, not any pain. No radiation into her arms. Neurologist didn't mention about the tingling in her head. Botox was discussed, as well as MRI of the brain.  She declined these measures. She hasn't had the twitching now for weeks, and didn't have it when she saw Dr. Terrace Arabia.  Was advised to have TSH checked at next PCP appointment. She was also diagnosed with essential tremor.  No treatment was recommended. CT scan of head 02/2022 showed: IMPRESSION: There is an area of relatively decreased density within the anterior inferior right frontal lobe cortical gray-white interface that appears horizontal bandlike in the region of similar linear beam hardening artifact from the  adjacent skull. This is favored to be artifactual. An acute to subacute stroke in this region is considered but felt less likely. If clinical concern persists for an acute infarction and the patient can safely undergo MRI, MRI may be helpful for further evaluation. Mild-to-moderate atrophy within normal limits for patient age.  Allergies:  she switched from Xyzal to Allegra last year, as xyzal was too sedating.  She is starting to note some nasal congestion, and plans to restart flonase, hasn't been taking any meds this season yet. She denies sinus pain or discolored mucus.  HTN--Blood pressures are not checked elsewhere (since the clinic closed at work, and then she retired). She denies headaches, chest pain, palpitations, shortness of breath, edema. Asymmetry of kidneys was noted on 01/2017 abdominal US. She was sent for Duplex which didn't show significant blockage (1-59% bilaterally).  Dr. Arbie Cookey had said to consider CT angiogram (if kidney function remains normal) only if she has persistent difficult to control hypertension (which she hasn't demonstrated). She had echo back in 2013, notable for moderate diastolic dysfunction, EF 60-65%.   Depression:  She continues on sertraline 100 mg daily (at one point last year she had cut back on her sertraline from 100mg  daily, down to 50mg  twice a week and 100mg  the other 5 days but increased the dose back up to 100mg  when dealing with the twitching).   Moods remain good.  She has retired.   Hyperlipidemia--previously took meds, but has been off lipid-lowering meds for years.  Has been following low cholesterol diet. Diet is unchanged from last year-- No creamy foods/soups, 1 egg daily.  Red meat 2-3x/week.  Uses lowfat dairy.   Lab Results  Component Value Date   CHOL 215 (H) 05/14/2022   HDL 66 05/14/2022   LDLCALC 130 (H) 05/14/2022   TRIG 109 05/14/2022   CHOLHDL 3.3 05/14/2022    H/o elevated LFT's.  Abdominal US was done 01/2017 showing  "question mild fatty infiltration of the liver." She sees Dr. Ewing Schlein yearly, who treats her reflux, monitors her liver. Had [place] 02/2021, and again in 07/2022, which showed: IMPRESSION: Increased hepatic parenchymal echogenicity suggestive of steatosis.  No cholelithiasis or sonographic evidence for acute cholecystitis.  Lab Results  Component Value Date   ALT 47 (H) 05/14/2022   AST 41 (H) 05/14/2022   ALKPHOS 121 05/14/2022   BILITOT 0.3 05/14/2022     Vitamin D deficiency--Last check was 30.4 in 04/2022, when taking MVI daily and 2000 IU D3.  Previously had been low at 27.5 in 01/2020, when only taking 1000 IU plus MVI. She continues to take 2000 IU  of D3 and MVI daily.  She has some degenerative disease in her neck and back, sees chiropractor regularly and sees Dr. Ethelene Hal for pain management.   She takes Tylenol #3, one daily (just one/day in the morning, didn't take when she was on the tramadol), and advil in the afternoon, taking between 2 and 4 daily.  Denies any GI side effects, takes PPI daily. She was prescribed meloxicam by her ortho last week, took it once, and states that because she wasn't able to take anything else, she hurt the rest of the day.  She prefers to take advil instead. She hadn't even taken the T#3 that day, nor any Tylenol (wasn't aware that she could). PDMP reviewed. She is prescribed #120 of tylenol #3 every 4 months from Dr. Ethelene Hal, last filled 12/2022. She has also been rx'd #9 vicodin, followed by #21 of tramadol (latter from Dr. Odis Hollingshead), in 03/2023.   Immunization History  Administered Date(s) Administered   COVID-19, mRNA, vaccine(Comirnaty)12 years and older 10/17/2022   Influenza Split 11/06/2009, 11/11/2009, 09/30/2011, 10/28/2012, 09/29/2013, 09/20/2014, 09/27/2015, 09/21/2019   Influenza, High Dose Seasonal PF 09/27/2021, 10/17/2022   Influenza,inj,Quad PF,6+ Mos 10/07/2019, 10/02/2020   Influenza-Unspecified 09/22/2015, 10/09/2016, 10/13/2017,  09/28/2018, 10/07/2019   PFIZER(Purple Top)SARS-COV-2 Vaccination 01/13/2020, 02/04/2020, 04/05/2021   PNEUMOCOCCAL CONJUGATE-20 12/15/2022   Pfizer Covid-19 Vaccine Bivalent Booster 90yrs & up 09/27/2021   Pneumococcal Polysaccharide-23 02/06/2021   Td 11/06/2001   Tdap 10/19/2011, 02/06/2021   Tetanus 12/21/2000, 12/21/2012   Unspecified SARS-COV-2 Vaccination 09/27/2021   Zoster Recombinat (Shingrix) 07/28/2019, 11/27/2019   Zoster, Live 12/09/2012  Got prevnar-20 and flu shot from pharmacy. Last Pap smear: UTD through GYN  (Dr. Juliene Pina) Last mammogram: 10/2022 Last colonoscopy: 10/16/2022 colonoscopy with Dr. Ewing Schlein (no report yet received), reportedly normal. Last colonoscopy was 10/2017, Dr. Ewing Schlein; showed diverticulosis and diminutive polyp--path showed polypoid colonic mucosa with lymphoid aggregate. Last DEXA: [facility] 01/2022, normal, T-0.9 at R and L fem neck.  Ordered by GYN, Dr. Juliene Pina. Dentist: twice yearly   Ophtho: yearly  Exercise:  joined [employer], was going 3x/week. She was doing weights, hadn't started cardio yet, then she fractured her ankle.    She believes she has a living will and healthcare power of attorney, but she plans to look at it again  to see if anything needs to be changed. Hasn't done this yet.  New forms given in case she wants to make changes.  Patient Care Team: Joselyn Arrow, MD as PCP - General (Family Medicine) Shea Evans, MD as Consulting Physician (Obstetrics and Gynecology) Vida Rigger, MD as Consulting Physician (Gastroenterology) Carman Ching, OD (Optometry) Sheran Luz, MD as Consulting Physician (Physical Medicine and Rehabilitation) Tora Duck, PA-C (Physician Assistant) Neuro: Dr. Terrace Arabia Ortho: Dr. Benjamin Stain: Dr. Glendale Chard Dentist: Dr. Judy Pimple  Depression Screening: Flowsheet Row Office Visit from 05/20/2023 in [facility]  PHQ-2 Total Score 0       Flowsheet Row Office Visit from 05/20/2023 in [facility]  PHQ-9 Total Score 0       Falls screen:     05/20/2023    8:39 AM 05/14/2022    8:53 AM 03/04/2022   11:20 AM 02/06/2021    8:39 AM 01/31/2020    8:53 AM  Fall Risk   Falls in the past year? 1 0 0 0 0  Number falls in past yr: 1 0 0    Comment 03/30/23 walking and fell skinned tip of finger. 04/07/23 fell down flight of stairs and broke R ankle      Injury with Fall? 1 0 0    Risk for fall due to : History of fall(s) No Fall Risks No Fall Risks    Follow up Falls evaluation completed Falls evaluation completed Falls evaluation completed       Functional Status Survey: Is the patient deaf or have difficulty hearing?: No Does the patient have difficulty seeing, even when wearing glasses/contacts?: Yes (might have glaucoma) Does the patient have difficulty concentrating, remembering, or making decisions?: Yes (difficulty with memory) Does the patient have difficulty walking or climbing stairs?: No Does the patient have difficulty dressing or bathing?: No Does the patient have difficulty doing errands alone such as visiting a doctor's office or shopping?: No  Mini-Cog Scoring: 5  No changes to her memory since the late 80's, "nothing new"   PMH, PSH, SH and FH were reviewed and updated  Outpatient Encounter Medications as of 05/20/2023  Medication Sig Note   acetaminophen-codeine (TYLENOL #3) 300-30 MG per tablet Take 1 tablet by mouth every 6 (six) hours as needed for moderate pain. 05/20/2023: Taking 3-4 times weekly   ascorbic acid (VITAMIN C) 500 MG tablet Take 1,000 mg by mouth daily.    B Complex-C (SUPER B COMPLEX PO) Take 1 capsule by mouth daily.    cholecalciferol (VITAMIN D) 1000 units tablet Take 2,000 Units by mouth daily.    docusate sodium (COLACE) 100 MG capsule Take 100 mg by mouth 2 (two) times daily.    ibuprofen (ADVIL,MOTRIN) 200 MG tablet Take 200-800 mg by mouth every 6 (six) hours as needed for headache or mild pain. 05/20/2023: 4-6 tablets daily    ketotifen (ZADITOR) 0.025 % ophthalmic solution Place 1 drop into both eyes every morning.    Lifitegrast 5 % SOLN Place 1 drop into both eyes 2 (two) times daily.  2/17/2022Benay Spice (from Dr. Hyacinth Meeker, ophtho)   lisinopril (ZESTRIL) 20 MG tablet Take 1 tablet (20 mg total) by mouth daily.    Magnesium 250 MG TABS Take 1 tablet by mouth every evening.    Multiple Vitamin (MULTIVITAMIN) tablet Take 1 tablet by mouth daily.    pantoprazole (PROTONIX) 40 MG tablet Take 40 mg by mouth daily.    polyethylene glycol (MIRALAX / GLYCOLAX)  packet Take 17 g by mouth daily as needed for mild constipation or moderate constipation. 05/20/2023: Daily    psyllium (HYDROCIL/METAMUCIL) 95 % PACK Take 1 packet by mouth daily.    sertraline (ZOLOFT) 100 MG tablet TAKE 1 TABLET (100 MG TOTAL) BY MOUTH DAILY.    dicyclomine (BENTYL) 10 MG capsule Take 10 mg by mouth 3 (three) times daily before meals. (Patient not taking: Reported on 05/20/2023) 05/20/2023: Rarely needs     fexofenadine (ALLEGRA) 180 MG tablet Take 180 mg by mouth daily. (Patient not taking: Reported on 05/20/2023) 05/20/2023: As needed   fluticasone (FLONASE) 50 MCG/ACT nasal spray Place 2 sprays into both nostrils daily. Reported on 01/20/2016 (Patient not taking: Reported on 05/20/2023)    methocarbamol (ROBAXIN) 500 MG tablet Take 500 mg by mouth every 8 (eight) hours as needed (neck pain). Reported on 01/20/2016 (Patient not taking: Reported on 05/20/2023) 05/20/2023: As needed,rarely   simethicone (MYLICON) 125 MG chewable tablet Chew 125 mg by mouth every 6 (six) hours as needed for flatulence. (Patient not taking: Reported on 05/20/2023) 05/20/2023: As needed   [DISCONTINUED] traMADol (ULTRAM) 50 MG tablet Take 50 mg by mouth every 8 (eight) hours.    No facility-administered encounter medications on file as of 05/20/2023.   Allergies  Allergen Reactions   Sulfamethoxazole-Trimethoprim Hives and Other (See Comments)    Other reaction(s): rash, joint  pain, fever   Darvocet [Propoxyphene N-Acetaminophen] Other (See Comments)    Faint, blood pressure goes sky high.   Etodolac Other (See Comments)    Red face, arms-looked like bad sunburn.   Gabapentin Other (See Comments)    tremors   Septra [Bactrim] Hives    ROS: The patient denies anorexia, fever, decreased hearing, ear pain, sore throat, breast concerns, chest pain, palpitations, syncope, dyspnea on exertion, cough, swelling, nausea, vomiting, diarrhea, melena, hematochezia, hematuria, incontinence, dysuria, vaginal bleeding, discharge, odor or itch, genital lesions, numbness, tingling, weakness, suspicious skin lesions, anxiety, abnormal bleeding/bruising, or enlarged lymph nodes.  She sees dermatologist yearly for skin checks.. had a skin cancer 01/2023, not melanoma, RUE. Essential tremor, not interfering with activities. Tingling on the back of the right side of her head, per HPI. Depression is stable/controlled Chronic back pain/arthritis, and neck pain, chiro helps Occasional bloating and gas (known HH/GERD); some mild constipation. Constipation is controlled with fiber/stool softener/metamucil. Reflux is less often than in the past, every 2-3 weeks. Denies dysphagia No changes to hair/skin/nails. Mild congestion/allergies   PHYSICAL EXAM:  BP 130/72   Pulse 60   Ht 5' 5.5" (1.664 m)   Wt 184 lb 6.4 oz (83.6 kg)   BMI 30.22 kg/m   Wt Readings from Last 3 Encounters:  05/20/23 184 lb 6.4 oz (83.6 kg)  05/10/23 165 lb (74.8 kg)  05/14/22 188 lb 9.6 oz (85.5 kg)   Declines changing into gown--sees GYN and dermatologist regularly.   General Appearance:    Alert, cooperative, no distress, appears stated age     Head:    Normocephalic, without obvious abnormality, atraumatic. Nontender at posterior scalp.     Eyes:    PERRL, conjunctiva/corneas clear, EOM's intact, fundi benign     Ears:    Normal TM's and external ear canals     Nose:    No drainage, sinuses nontender.   Throat:    Clear without lesions or erythema.  Neck:    Supple, no lymphadenopathy; thyroid: no enlargement/ tenderness/nodules; no carotid bruit or JVD     Back:  Spine nontender, no curvature, ROM normal, no CVA tenderness     Lungs:    Clear to auscultation bilaterally without wheezes, rales or ronchi; respirations unlabored     Chest Wall:    No tenderness or deformity     Heart:    Regular rate and rhythm, S1 and S2 normal, no murmur, rub or gallop     Breast Exam:    Deferred to GYN     Abdomen:    Soft, non-tender, nondistended, normoactive bowel sounds, no masses,  no hepatosplenomegaly     Genitalia:    Deferred to GYN            Extremities:    No clubbing, cyanosis or edema (on left). Small nodules present at DIP's of pinkies bilaterally, nontender. R ankle in a brace  Pulses:    2+ and symmetric all extremities     Skin:    Skin color, texture, turgor normal, no rashes or lesions. Limited exam (chose not to get into gown, sees derm).   Lymph nodes:    Cervical, supraclavicular nodes normal     Neurologic:    Normal strength, sensation and gait; reflexes 2+ and symmetric throughout. Slight tremor noted L>R (when pointing index fingers at each other).                       Psych:   Normal mood, affect, hygiene and grooming   EKG: sinus bradycardia, rate 58. LVH, poss LAE.   ASSESSMENT/PLAN:  Welcome to Medicare preventive visit -     CBC with Differential/Platelet -     TSH -     EKG 12-Lead  Elevated LFTs Assessment & Plan: Hepatic steatosis noted on prior scans. Discussed weight loss, and potential benefit of lowering cholesterol. LFTs have been stable, only mildly elevated. Discussed potential risks of fatty liver.   Essential hypertension, benign Assessment & Plan: Controlled on current regimen. Encouraged to periodically check BP, goal <130/80.  Restart regular exercise when cleared from ortho. Low sodium diet and weight loss recommended  Orders: -      Lisinopril; Take 1 tablet (20 mg total) by mouth daily.  Dispense: 90 tablet; Refill: 1 -     CMP14+EGFR -     CT CARDIAC SCORING (SELF PAY ONLY); Future  Gastroesophageal reflux disease, unspecified whether esophagitis present Assessment & Plan: Improved, infrequent   Hemifacial spasm of right side of face Assessment & Plan: Not currently bothersome. Aware that she can follow up with Dr. Terrace Arabia if it recurs/worsens, for treatment   Pure hypercholesterolemia Assessment & Plan: Reviewed low cholesterol diet.  If LDL higher, discussed and pt interested in checking calcium score. May need to restart statin medication  Orders: -     CT CARDIAC SCORING (SELF PAY ONLY); Future  Vitamin D deficiency Assessment & Plan: Continue daily supplements  Orders: -     Lipid panel -     VITAMIN D 25 Hydroxy (Vit-D Deficiency, Fractures)  Allergic rhinitis, unspecified seasonality, unspecified trigger Assessment & Plan: Discussed use of Allegra, mucinex prn, and flonase (she can check if okay with her ophtho, since it sounds like she may be a glaucoma suspect).   Impaired fasting glucose Assessment & Plan: Reviewed proper diet, exercise, weight loss  Orders: -     Hemoglobin A1c  Depression, major, in remission (HCC) Assessment & Plan: Discussed potential to slowly taper down to 50mg  sertraline daily, if/when stressors are stable/improved (not currently, with ankle  still healing, exercise limited).  Orders: -     Sertraline HCl; TAKE 1 TABLET (100 MG TOTAL) BY MOUTH DAILY.  Dispense: 90 tablet; Refill: 3  Essential tremor Assessment & Plan: Minimal, not interfering with daily activities. Briefly discussed meds if bothersome (and that she can also f/u with neuro)      Discussed monthly self breast exams and yearly mammograms; at least 30 minutes of aerobic activity at least 5 days/week, weight-bearing exercise at least 2x/week; proper sunscreen use reviewed; healthy diet, including  goals of calcium and vitamin D intake and alcohol recommendations (less than or equal to 1 drink/day) reviewed; regular seatbelt use; changing batteries in smoke detectors. Immunization recommendations discussed--continue high dose flu shots yearly. Bivalent COVID booster recommended, declined. To get RSV vaccine in the Fall from the pharmacy. Colonoscopy recommendations reviewed--UTD, need to get records from [facility] of recent colonoscopy.  MOST form reviewed, updated. Requested living will, healthcare power of attorney. New forms given in case she wants to make changes.   Since no longer sees health clinic, needs to f/u q6 mos, not yearly. Med check 6 mos, CPE 1 year.   Medicare Attestation I have personally reviewed: The patient's medical and social history Their use of alcohol, tobacco or illicit drugs Their current medications and supplements The patient's functional ability including ADLs,fall risks, home safety risks, cognitive, and hearing and visual impairment Diet and physical activities Evidence for depression or mood disorders  The patient's weight, height, BMI have been recorded in the chart.  I have made referrals, counseling, and provided education to the patient based on review of the above and I have provided the patient with a written personalized care plan for preventive services.     Lavonda Jumbo, MD

## 2023-05-19 DIAGNOSIS — R7301 Impaired fasting glucose: Secondary | ICD-10-CM | POA: Insufficient documentation

## 2023-05-19 DIAGNOSIS — E559 Vitamin D deficiency, unspecified: Secondary | ICD-10-CM | POA: Insufficient documentation

## 2023-05-19 DIAGNOSIS — J309 Allergic rhinitis, unspecified: Secondary | ICD-10-CM | POA: Insufficient documentation

## 2023-05-20 ENCOUNTER — Ambulatory Visit (INDEPENDENT_AMBULATORY_CARE_PROVIDER_SITE_OTHER): Payer: Medicare HMO | Admitting: Family Medicine

## 2023-05-20 ENCOUNTER — Encounter: Payer: Self-pay | Admitting: Family Medicine

## 2023-05-20 VITALS — BP 130/72 | HR 60 | Ht 65.5 in | Wt 184.4 lb

## 2023-05-20 DIAGNOSIS — K219 Gastro-esophageal reflux disease without esophagitis: Secondary | ICD-10-CM | POA: Diagnosis not present

## 2023-05-20 DIAGNOSIS — R7989 Other specified abnormal findings of blood chemistry: Secondary | ICD-10-CM | POA: Diagnosis not present

## 2023-05-20 DIAGNOSIS — R7301 Impaired fasting glucose: Secondary | ICD-10-CM | POA: Diagnosis not present

## 2023-05-20 DIAGNOSIS — J309 Allergic rhinitis, unspecified: Secondary | ICD-10-CM | POA: Diagnosis not present

## 2023-05-20 DIAGNOSIS — G5131 Clonic hemifacial spasm, right: Secondary | ICD-10-CM | POA: Diagnosis not present

## 2023-05-20 DIAGNOSIS — Z0000 Encounter for general adult medical examination without abnormal findings: Secondary | ICD-10-CM | POA: Diagnosis not present

## 2023-05-20 DIAGNOSIS — E7800 Pure hypercholesterolemia, unspecified: Secondary | ICD-10-CM | POA: Diagnosis not present

## 2023-05-20 DIAGNOSIS — I10 Essential (primary) hypertension: Secondary | ICD-10-CM | POA: Diagnosis not present

## 2023-05-20 DIAGNOSIS — F325 Major depressive disorder, single episode, in full remission: Secondary | ICD-10-CM

## 2023-05-20 DIAGNOSIS — E559 Vitamin D deficiency, unspecified: Secondary | ICD-10-CM | POA: Diagnosis not present

## 2023-05-20 DIAGNOSIS — G250 Essential tremor: Secondary | ICD-10-CM

## 2023-05-20 LAB — LIPID PANEL

## 2023-05-20 MED ORDER — SERTRALINE HCL 100 MG PO TABS: ORAL_TABLET | ORAL | 3 refills | Status: DC

## 2023-05-20 MED ORDER — LISINOPRIL 20 MG PO TABS: 20.0000 mg | ORAL_TABLET | Freq: Every day | ORAL | 1 refills | Status: DC

## 2023-05-21 DIAGNOSIS — L578 Other skin changes due to chronic exposure to nonionizing radiation: Secondary | ICD-10-CM | POA: Diagnosis not present

## 2023-05-21 DIAGNOSIS — L814 Other melanin hyperpigmentation: Secondary | ICD-10-CM | POA: Diagnosis not present

## 2023-05-21 DIAGNOSIS — Z08 Encounter for follow-up examination after completed treatment for malignant neoplasm: Secondary | ICD-10-CM | POA: Diagnosis not present

## 2023-05-21 DIAGNOSIS — Z86007 Personal history of in-situ neoplasm of skin: Secondary | ICD-10-CM | POA: Diagnosis not present

## 2023-05-21 DIAGNOSIS — L821 Other seborrheic keratosis: Secondary | ICD-10-CM | POA: Diagnosis not present

## 2023-05-21 LAB — CBC WITH DIFFERENTIAL/PLATELET
Basophils Absolute: 0.1 10*3/uL (ref 0.0–0.2)
Basos: 1 %
EOS (ABSOLUTE): 0.2 10*3/uL (ref 0.0–0.4)
Eos: 3 %
Hematocrit: 40.1 % (ref 34.0–46.6)
Hemoglobin: 13.8 g/dL (ref 11.1–15.9)
Immature Grans (Abs): 0 10*3/uL (ref 0.0–0.1)
Immature Granulocytes: 0 %
Lymphocytes Absolute: 1.7 10*3/uL (ref 0.7–3.1)
Lymphs: 38 %
MCH: 31.7 pg (ref 26.6–33.0)
MCHC: 34.4 g/dL (ref 31.5–35.7)
MCV: 92 fL (ref 79–97)
Monocytes Absolute: 0.5 10*3/uL (ref 0.1–0.9)
Monocytes: 10 %
Neutrophils Absolute: 2.1 10*3/uL (ref 1.4–7.0)
Neutrophils: 48 %
Platelets: 333 10*3/uL (ref 150–450)
RBC: 4.35 x10E6/uL (ref 3.77–5.28)
RDW: 12 % (ref 11.7–15.4)
WBC: 4.5 10*3/uL (ref 3.4–10.8)

## 2023-05-21 LAB — CMP14+EGFR
ALT: 50 IU/L — ABNORMAL HIGH (ref 0–32)
AST: 34 IU/L (ref 0–40)
Albumin/Globulin Ratio: 1.8 (ref 1.2–2.2)
Albumin: 4.6 g/dL (ref 3.9–4.9)
Alkaline Phosphatase: 147 IU/L — ABNORMAL HIGH (ref 44–121)
BUN/Creatinine Ratio: 16 (ref 12–28)
BUN: 13 mg/dL (ref 8–27)
Bilirubin Total: 0.4 mg/dL (ref 0.0–1.2)
CO2: 20 mmol/L (ref 20–29)
Calcium: 9.7 mg/dL (ref 8.7–10.3)
Chloride: 100 mmol/L (ref 96–106)
Creatinine, Ser: 0.8 mg/dL (ref 0.57–1.00)
Globulin, Total: 2.6 g/dL (ref 1.5–4.5)
Glucose: 100 mg/dL — ABNORMAL HIGH (ref 70–99)
Potassium: 4.8 mmol/L (ref 3.5–5.2)
Sodium: 136 mmol/L (ref 134–144)
Total Protein: 7.2 g/dL (ref 6.0–8.5)
eGFR: 80 mL/min/{1.73_m2} (ref >59)

## 2023-05-21 LAB — HEMOGLOBIN A1C
Est. average glucose Bld gHb Est-mCnc: 117 mg/dL
Hgb A1c MFr Bld: 5.7 % — ABNORMAL HIGH (ref 4.8–5.6)

## 2023-05-21 LAB — VITAMIN D 25 HYDROXY (VIT D DEFICIENCY, FRACTURES): Vit D, 25-Hydroxy: 35.2 ng/mL (ref 30.0–100.0)

## 2023-05-21 LAB — TSH: TSH: 1.34 u[IU]/mL (ref 0.450–4.500)

## 2023-05-21 LAB — LIPID PANEL
Chol/HDL Ratio: 3.7 ratio (ref 0.0–4.4)
Cholesterol, Total: 234 mg/dL — ABNORMAL HIGH (ref 100–199)
HDL: 64 mg/dL (ref >39)
LDL Chol Calc (NIH): 147 mg/dL — ABNORMAL HIGH (ref 0–99)
Triglycerides: 129 mg/dL (ref 0–149)
VLDL Cholesterol Cal: 23 mg/dL (ref 5–40)

## 2023-05-21 NOTE — Assessment & Plan Note (Signed)
Hepatic steatosis noted on prior scans. Discussed weight loss, and potential benefit of lowering cholesterol. LFTs have been stable, only mildly elevated. Discussed potential risks of fatty liver.

## 2023-05-21 NOTE — Assessment & Plan Note (Signed)
Continue daily supplements.  

## 2023-05-21 NOTE — Assessment & Plan Note (Signed)
Reviewed proper diet, exercise, weight loss

## 2023-05-21 NOTE — Assessment & Plan Note (Signed)
Improved, infrequent

## 2023-05-21 NOTE — Assessment & Plan Note (Signed)
Minimal, not interfering with daily activities. Briefly discussed meds if bothersome (and that she can also f/u with neuro)

## 2023-05-21 NOTE — Assessment & Plan Note (Signed)
Controlled on current regimen. Encouraged to periodically check BP, goal <130/80.  Restart regular exercise when cleared from ortho. Low sodium diet and weight loss recommended

## 2023-05-21 NOTE — Assessment & Plan Note (Signed)
Discussed potential to slowly taper down to 50mg  sertraline daily, if/when stressors are stable/improved (not currently, with ankle still healing, exercise limited).

## 2023-05-21 NOTE — Assessment & Plan Note (Signed)
Discussed use of Allegra, mucinex prn, and flonase (she can check if okay with her ophtho, since it sounds like she may be a glaucoma suspect).

## 2023-05-21 NOTE — Assessment & Plan Note (Signed)
Not currently bothersome. Aware that she can follow up with Dr. Terrace Arabia if it recurs/worsens, for treatment

## 2023-05-21 NOTE — Assessment & Plan Note (Signed)
Reviewed low cholesterol diet.  If LDL higher, discussed and pt interested in checking calcium score. May need to restart statin medication

## 2023-05-26 DIAGNOSIS — R109 Unspecified abdominal pain: Secondary | ICD-10-CM | POA: Diagnosis not present

## 2023-05-26 DIAGNOSIS — K5909 Other constipation: Secondary | ICD-10-CM | POA: Diagnosis not present

## 2023-05-27 ENCOUNTER — Encounter: Payer: Self-pay | Admitting: *Deleted

## 2023-06-02 DIAGNOSIS — M9903 Segmental and somatic dysfunction of lumbar region: Secondary | ICD-10-CM | POA: Diagnosis not present

## 2023-06-02 DIAGNOSIS — M9905 Segmental and somatic dysfunction of pelvic region: Secondary | ICD-10-CM | POA: Diagnosis not present

## 2023-06-02 DIAGNOSIS — M5136 Other intervertebral disc degeneration, lumbar region: Secondary | ICD-10-CM | POA: Diagnosis not present

## 2023-06-02 DIAGNOSIS — M9904 Segmental and somatic dysfunction of sacral region: Secondary | ICD-10-CM | POA: Diagnosis not present

## 2023-06-07 DIAGNOSIS — M9905 Segmental and somatic dysfunction of pelvic region: Secondary | ICD-10-CM | POA: Diagnosis not present

## 2023-06-07 DIAGNOSIS — Z6829 Body mass index (BMI) 29.0-29.9, adult: Secondary | ICD-10-CM | POA: Diagnosis not present

## 2023-06-07 DIAGNOSIS — M5136 Other intervertebral disc degeneration, lumbar region: Secondary | ICD-10-CM | POA: Diagnosis not present

## 2023-06-07 DIAGNOSIS — M9903 Segmental and somatic dysfunction of lumbar region: Secondary | ICD-10-CM | POA: Diagnosis not present

## 2023-06-07 DIAGNOSIS — Z01419 Encounter for gynecological examination (general) (routine) without abnormal findings: Secondary | ICD-10-CM | POA: Diagnosis not present

## 2023-06-07 DIAGNOSIS — Z124 Encounter for screening for malignant neoplasm of cervix: Secondary | ICD-10-CM | POA: Diagnosis not present

## 2023-06-07 DIAGNOSIS — M9904 Segmental and somatic dysfunction of sacral region: Secondary | ICD-10-CM | POA: Diagnosis not present

## 2023-06-07 DIAGNOSIS — Z01411 Encounter for gynecological examination (general) (routine) with abnormal findings: Secondary | ICD-10-CM | POA: Diagnosis not present

## 2023-06-09 DIAGNOSIS — M5030 Other cervical disc degeneration, unspecified cervical region: Secondary | ICD-10-CM | POA: Diagnosis not present

## 2023-06-10 ENCOUNTER — Ambulatory Visit (HOSPITAL_COMMUNITY)
Admission: RE | Admit: 2023-06-10 | Discharge: 2023-06-10 | Disposition: A | Discharge status: Home / Self Care | Payer: Medicare HMO | Source: Ambulatory Visit | Attending: Family Medicine | Admitting: Family Medicine

## 2023-06-10 DIAGNOSIS — I10 Essential (primary) hypertension: Secondary | ICD-10-CM | POA: Insufficient documentation

## 2023-06-10 DIAGNOSIS — E7800 Pure hypercholesterolemia, unspecified: Secondary | ICD-10-CM | POA: Insufficient documentation

## 2023-06-11 ENCOUNTER — Other Ambulatory Visit: Payer: Self-pay | Admitting: Family Medicine

## 2023-06-11 DIAGNOSIS — Z5181 Encounter for therapeutic drug level monitoring: Secondary | ICD-10-CM

## 2023-06-11 DIAGNOSIS — E7800 Pure hypercholesterolemia, unspecified: Secondary | ICD-10-CM

## 2023-06-11 MED ORDER — ROSUVASTATIN CALCIUM 10 MG PO TABS
10.0000 mg | ORAL_TABLET | Freq: Every day | ORAL | 2 refills | Status: DC
Start: 2023-06-11 — End: 2023-08-11

## 2023-06-14 DIAGNOSIS — M9905 Segmental and somatic dysfunction of pelvic region: Secondary | ICD-10-CM | POA: Diagnosis not present

## 2023-06-14 DIAGNOSIS — M5136 Other intervertebral disc degeneration, lumbar region: Secondary | ICD-10-CM | POA: Diagnosis not present

## 2023-06-14 DIAGNOSIS — M9903 Segmental and somatic dysfunction of lumbar region: Secondary | ICD-10-CM | POA: Diagnosis not present

## 2023-06-14 DIAGNOSIS — M9904 Segmental and somatic dysfunction of sacral region: Secondary | ICD-10-CM | POA: Diagnosis not present

## 2023-06-21 ENCOUNTER — Encounter: Payer: Self-pay | Admitting: Neurology

## 2023-06-21 DIAGNOSIS — M9904 Segmental and somatic dysfunction of sacral region: Secondary | ICD-10-CM | POA: Diagnosis not present

## 2023-06-21 DIAGNOSIS — M5136 Other intervertebral disc degeneration, lumbar region: Secondary | ICD-10-CM | POA: Diagnosis not present

## 2023-06-21 DIAGNOSIS — M9903 Segmental and somatic dysfunction of lumbar region: Secondary | ICD-10-CM | POA: Diagnosis not present

## 2023-06-21 DIAGNOSIS — M9905 Segmental and somatic dysfunction of pelvic region: Secondary | ICD-10-CM | POA: Diagnosis not present

## 2023-06-21 DIAGNOSIS — G5131 Clonic hemifacial spasm, right: Secondary | ICD-10-CM

## 2023-06-22 ENCOUNTER — Encounter: Payer: Self-pay | Admitting: Family Medicine

## 2023-06-23 ENCOUNTER — Telehealth: Payer: Self-pay | Admitting: Neurology

## 2023-06-23 NOTE — Telephone Encounter (Signed)
[employer] medicare sent to [facility] they obtain auth [id number] 

## 2023-06-25 DIAGNOSIS — S8261XA Displaced fracture of lateral malleolus of right fibula, initial encounter for closed fracture: Secondary | ICD-10-CM | POA: Diagnosis not present

## 2023-06-28 DIAGNOSIS — M9903 Segmental and somatic dysfunction of lumbar region: Secondary | ICD-10-CM | POA: Diagnosis not present

## 2023-06-28 DIAGNOSIS — M9905 Segmental and somatic dysfunction of pelvic region: Secondary | ICD-10-CM | POA: Diagnosis not present

## 2023-06-28 DIAGNOSIS — M5136 Other intervertebral disc degeneration, lumbar region: Secondary | ICD-10-CM | POA: Diagnosis not present

## 2023-06-28 DIAGNOSIS — M9904 Segmental and somatic dysfunction of sacral region: Secondary | ICD-10-CM | POA: Diagnosis not present

## 2023-06-29 DIAGNOSIS — H52223 Regular astigmatism, bilateral: Secondary | ICD-10-CM | POA: Diagnosis not present

## 2023-06-29 DIAGNOSIS — Z83511 Family history of glaucoma: Secondary | ICD-10-CM | POA: Diagnosis not present

## 2023-06-29 DIAGNOSIS — H40013 Open angle with borderline findings, low risk, bilateral: Secondary | ICD-10-CM | POA: Diagnosis not present

## 2023-06-29 DIAGNOSIS — H40053 Ocular hypertension, bilateral: Secondary | ICD-10-CM | POA: Diagnosis not present

## 2023-06-29 DIAGNOSIS — H524 Presbyopia: Secondary | ICD-10-CM | POA: Diagnosis not present

## 2023-07-02 ENCOUNTER — Encounter: Payer: Self-pay | Admitting: Neurology

## 2023-07-09 ENCOUNTER — Ambulatory Visit
Admission: RE | Admit: 2023-07-09 | Discharge: 2023-07-09 | Disposition: A | Discharge status: Home / Self Care | Payer: Medicare HMO | Source: Ambulatory Visit | Attending: Neurology | Admitting: Neurology

## 2023-07-09 DIAGNOSIS — G5131 Clonic hemifacial spasm, right: Secondary | ICD-10-CM | POA: Diagnosis not present

## 2023-07-09 MED ORDER — GADOPICLENOL 0.5 MMOL/ML IV SOLN
9.0000 mL | Freq: Once | INTRAVENOUS | Status: AC | PRN
  Administered 2023-07-09: 9 mL via INTRAVENOUS

## 2023-07-15 DIAGNOSIS — M25571 Pain in right ankle and joints of right foot: Secondary | ICD-10-CM | POA: Diagnosis not present

## 2023-07-15 DIAGNOSIS — M25671 Stiffness of right ankle, not elsewhere classified: Secondary | ICD-10-CM | POA: Diagnosis not present

## 2023-07-16 DIAGNOSIS — M5459 Other low back pain: Secondary | ICD-10-CM | POA: Diagnosis not present

## 2023-07-16 DIAGNOSIS — M546 Pain in thoracic spine: Secondary | ICD-10-CM | POA: Diagnosis not present

## 2023-07-16 DIAGNOSIS — M5030 Other cervical disc degeneration, unspecified cervical region: Secondary | ICD-10-CM | POA: Diagnosis not present

## 2023-07-16 DIAGNOSIS — R079 Chest pain, unspecified: Secondary | ICD-10-CM | POA: Diagnosis not present

## 2023-07-16 DIAGNOSIS — M545 Low back pain, unspecified: Secondary | ICD-10-CM | POA: Diagnosis not present

## 2023-07-20 NOTE — Progress Notes (Signed)
Brain MRI shows only normal age-related changes and does not show any cause for her symptoms

## 2023-07-21 DIAGNOSIS — M25571 Pain in right ankle and joints of right foot: Secondary | ICD-10-CM | POA: Diagnosis not present

## 2023-07-21 DIAGNOSIS — M25671 Stiffness of right ankle, not elsewhere classified: Secondary | ICD-10-CM | POA: Diagnosis not present

## 2023-07-23 DIAGNOSIS — M25671 Stiffness of right ankle, not elsewhere classified: Secondary | ICD-10-CM | POA: Diagnosis not present

## 2023-07-23 DIAGNOSIS — M25571 Pain in right ankle and joints of right foot: Secondary | ICD-10-CM | POA: Diagnosis not present

## 2023-07-27 DIAGNOSIS — M9905 Segmental and somatic dysfunction of pelvic region: Secondary | ICD-10-CM | POA: Diagnosis not present

## 2023-07-27 DIAGNOSIS — M5136 Other intervertebral disc degeneration, lumbar region: Secondary | ICD-10-CM | POA: Diagnosis not present

## 2023-07-27 DIAGNOSIS — M9903 Segmental and somatic dysfunction of lumbar region: Secondary | ICD-10-CM | POA: Diagnosis not present

## 2023-07-27 DIAGNOSIS — M9904 Segmental and somatic dysfunction of sacral region: Secondary | ICD-10-CM | POA: Diagnosis not present

## 2023-07-27 DIAGNOSIS — M25671 Stiffness of right ankle, not elsewhere classified: Secondary | ICD-10-CM | POA: Diagnosis not present

## 2023-07-27 DIAGNOSIS — M25571 Pain in right ankle and joints of right foot: Secondary | ICD-10-CM | POA: Diagnosis not present

## 2023-08-10 ENCOUNTER — Other Ambulatory Visit: Payer: Medicare HMO

## 2023-08-10 DIAGNOSIS — Z5181 Encounter for therapeutic drug level monitoring: Secondary | ICD-10-CM

## 2023-08-10 DIAGNOSIS — M5459 Other low back pain: Secondary | ICD-10-CM | POA: Diagnosis not present

## 2023-08-10 DIAGNOSIS — E7800 Pure hypercholesterolemia, unspecified: Secondary | ICD-10-CM | POA: Diagnosis not present

## 2023-08-10 DIAGNOSIS — M5414 Radiculopathy, thoracic region: Secondary | ICD-10-CM | POA: Diagnosis not present

## 2023-08-11 ENCOUNTER — Other Ambulatory Visit: Payer: Self-pay | Admitting: Family Medicine

## 2023-08-11 DIAGNOSIS — E7800 Pure hypercholesterolemia, unspecified: Secondary | ICD-10-CM

## 2023-08-11 LAB — HEPATIC FUNCTION PANEL
ALT: 32 IU/L (ref 0–32)
AST: 32 IU/L (ref 0–40)
Albumin: 4.6 g/dL (ref 3.9–4.9)
Alkaline Phosphatase: 109 IU/L (ref 44–121)
Bilirubin Total: 0.4 mg/dL (ref 0.0–1.2)
Bilirubin, Direct: 0.11 mg/dL (ref 0.00–0.40)
Total Protein: 6.8 g/dL (ref 6.0–8.5)

## 2023-08-11 LAB — LIPID PANEL
Chol/HDL Ratio: 2.4 ratio (ref 0.0–4.4)
Cholesterol, Total: 147 mg/dL (ref 100–199)
HDL: 62 mg/dL (ref >39)
LDL Chol Calc (NIH): 65 mg/dL (ref 0–99)
Triglycerides: 109 mg/dL (ref 0–149)
VLDL Cholesterol Cal: 20 mg/dL (ref 5–40)

## 2023-08-11 MED ORDER — ROSUVASTATIN CALCIUM 10 MG PO TABS: 10.0000 mg | ORAL_TABLET | Freq: Every day | ORAL | 3 refills | Status: DC

## 2023-08-26 DIAGNOSIS — M25671 Stiffness of right ankle, not elsewhere classified: Secondary | ICD-10-CM | POA: Diagnosis not present

## 2023-08-26 DIAGNOSIS — M25571 Pain in right ankle and joints of right foot: Secondary | ICD-10-CM | POA: Diagnosis not present

## 2023-09-01 DIAGNOSIS — M9904 Segmental and somatic dysfunction of sacral region: Secondary | ICD-10-CM | POA: Diagnosis not present

## 2023-09-01 DIAGNOSIS — M9905 Segmental and somatic dysfunction of pelvic region: Secondary | ICD-10-CM | POA: Diagnosis not present

## 2023-09-01 DIAGNOSIS — M5136 Other intervertebral disc degeneration, lumbar region: Secondary | ICD-10-CM | POA: Diagnosis not present

## 2023-09-01 DIAGNOSIS — M9903 Segmental and somatic dysfunction of lumbar region: Secondary | ICD-10-CM | POA: Diagnosis not present

## 2023-09-08 DIAGNOSIS — M9904 Segmental and somatic dysfunction of sacral region: Secondary | ICD-10-CM | POA: Diagnosis not present

## 2023-09-08 DIAGNOSIS — M9905 Segmental and somatic dysfunction of pelvic region: Secondary | ICD-10-CM | POA: Diagnosis not present

## 2023-09-08 DIAGNOSIS — M5459 Other low back pain: Secondary | ICD-10-CM | POA: Diagnosis not present

## 2023-09-08 DIAGNOSIS — M5136 Other intervertebral disc degeneration, lumbar region: Secondary | ICD-10-CM | POA: Diagnosis not present

## 2023-09-08 DIAGNOSIS — M9903 Segmental and somatic dysfunction of lumbar region: Secondary | ICD-10-CM | POA: Diagnosis not present

## 2023-09-16 DIAGNOSIS — M5416 Radiculopathy, lumbar region: Secondary | ICD-10-CM | POA: Diagnosis not present

## 2023-09-20 DIAGNOSIS — M5136 Other intervertebral disc degeneration, lumbar region: Secondary | ICD-10-CM | POA: Diagnosis not present

## 2023-09-20 DIAGNOSIS — M9903 Segmental and somatic dysfunction of lumbar region: Secondary | ICD-10-CM | POA: Diagnosis not present

## 2023-09-20 DIAGNOSIS — M9905 Segmental and somatic dysfunction of pelvic region: Secondary | ICD-10-CM | POA: Diagnosis not present

## 2023-09-20 DIAGNOSIS — M9904 Segmental and somatic dysfunction of sacral region: Secondary | ICD-10-CM | POA: Diagnosis not present

## 2023-09-27 DIAGNOSIS — R69 Illness, unspecified: Secondary | ICD-10-CM | POA: Diagnosis not present

## 2023-09-27 DIAGNOSIS — M5416 Radiculopathy, lumbar region: Secondary | ICD-10-CM | POA: Diagnosis not present

## 2023-09-28 DIAGNOSIS — M9904 Segmental and somatic dysfunction of sacral region: Secondary | ICD-10-CM | POA: Diagnosis not present

## 2023-09-28 DIAGNOSIS — M9903 Segmental and somatic dysfunction of lumbar region: Secondary | ICD-10-CM | POA: Diagnosis not present

## 2023-09-28 DIAGNOSIS — S8261XA Displaced fracture of lateral malleolus of right fibula, initial encounter for closed fracture: Secondary | ICD-10-CM | POA: Diagnosis not present

## 2023-09-28 DIAGNOSIS — M9905 Segmental and somatic dysfunction of pelvic region: Secondary | ICD-10-CM | POA: Diagnosis not present

## 2023-10-18 DIAGNOSIS — R69 Illness, unspecified: Secondary | ICD-10-CM | POA: Diagnosis not present

## 2023-10-28 DIAGNOSIS — M9905 Segmental and somatic dysfunction of pelvic region: Secondary | ICD-10-CM | POA: Diagnosis not present

## 2023-10-28 DIAGNOSIS — M9903 Segmental and somatic dysfunction of lumbar region: Secondary | ICD-10-CM | POA: Diagnosis not present

## 2023-10-28 DIAGNOSIS — M5136 Other intervertebral disc degeneration, lumbar region with discogenic back pain only: Secondary | ICD-10-CM | POA: Diagnosis not present

## 2023-10-28 DIAGNOSIS — M9904 Segmental and somatic dysfunction of sacral region: Secondary | ICD-10-CM | POA: Diagnosis not present

## 2023-11-05 ENCOUNTER — Other Ambulatory Visit: Payer: Self-pay | Admitting: Family Medicine

## 2023-11-05 DIAGNOSIS — I10 Essential (primary) hypertension: Secondary | ICD-10-CM

## 2023-11-05 NOTE — Telephone Encounter (Signed)
Pt states she only has about 2 weeks left and will need a refill

## 2023-11-08 DIAGNOSIS — M25562 Pain in left knee: Secondary | ICD-10-CM | POA: Diagnosis not present

## 2023-11-08 DIAGNOSIS — M25561 Pain in right knee: Secondary | ICD-10-CM | POA: Diagnosis not present

## 2023-11-17 DIAGNOSIS — Z1231 Encounter for screening mammogram for malignant neoplasm of breast: Secondary | ICD-10-CM | POA: Diagnosis not present

## 2023-11-17 LAB — HM MAMMOGRAPHY

## 2023-11-20 DIAGNOSIS — R931 Abnormal findings on diagnostic imaging of heart and coronary circulation: Secondary | ICD-10-CM | POA: Insufficient documentation

## 2023-11-20 NOTE — Progress Notes (Unsigned)
No chief complaint on file.  Patient presents for 6 month follow-up on chronic problems  She was seen in March 2023 with facial twitching, a drawing up of her lip when she closed her eyes (R side).  She had CT and was referred to neurology. She had reported improvement in this after seeing her chiropractor (told it was C1), so held off on scheduling appointment with the neurologist.  She ultimately did see neuro, Dr. Terrace Arabia. She had MRI done 06/2023: IMPRESSION: This MRI of the brain with and without contrast shows the following: Couple small T2/FLAIR hyperintense foci in the cerebral hemispheres and 1 in the pons consistent with very minimal chronic microvascular ischemic change, typical for age. The facial nerves appear normal and there did not appear to be any enhancement along the nerve or compression of the nerve or adjacent brainstem. Normal enhancement pattern.  No acute findings.  ***UPDATE She was also diagnosed with essential tremor.  No treatment was recommended.    Allergies:  she takes Allegra daily. Flonase?? *** She denies sinus pain or discolored mucus.   HTN--Blood pressures are not checked elsewhere. She denies headaches, chest pain, palpitations, shortness of breath, edema. Asymmetry of kidneys was noted on 01/2017 abdominal US. She was sent for Duplex which didn't show significant blockage (1-59% bilaterally).  Dr. Arbie Cookey had said to consider CT angiogram (if kidney function remains normal) only if she has persistent difficult to control hypertension (which she hasn't demonstrated). She had echo back in 2013, notable for moderate diastolic dysfunction, EF 60-65%.   BP Readings from Last 3 Encounters:  05/20/23 130/72  05/10/23 (!) 169/81  05/14/22 128/78    Depression:  She continues on sertraline 100 mg daily (at one point last year she had cut back on her sertraline from 100mg  daily, down to 50mg  twice a week and 100mg  the other 5 days but increased the dose back up to  100mg  when dealing with the twitching).   Moods remain good.  She is retired.   Hyperlipidemia--She had calcium score of 52.5 in 05/2023 (68th percentile for age). She was started on Rosuvastatin 10mg , and lipids were improved. She denies side effects.  Component Ref Range & Units 3 mo ago 6 mo ago 1 yr ago 3 yr ago 4 yr ago 5 yr ago 7 yr ago  Cholesterol, Total 100 - 199 mg/dL 161 096 High  045 High  243 High  211 High  236 High    Triglycerides 0 - 149 mg/dL 409 811 914 782 84 956 115 R  HDL >39 mg/dL 62 64 66 74 70 72 61 R  VLDL Cholesterol Cal 5 - 40 mg/dL 20 23 19 18 17 23    LDL Chol Calc (NIH) 0 - 99 mg/dL 65 213 High  086 High  151 High      Chol/HDL Ratio 0.0 - 4.4 ratio 2.4 3.7 CM 3.3 CM 3.3 CM 3.0 CM 3.3 CM 3.3 R   Diet--No creamy foods/soups, 1 egg daily.  Red meat 2-3x/week.  Uses lowfat dairy.      H/o elevated LFT's.  Abdominal US was done 01/2017 showing "question mild fatty infiltration of the liver." She sees Dr. Ewing Schlein yearly, who treats her reflux, monitors her liver. Had [place] 02/2021, and again in 07/2022, which showed: IMPRESSION: Increased hepatic parenchymal echogenicity suggestive of steatosis.  No cholelithiasis or sonographic evidence for acute cholecystitis.  LFT's normal on last check: Lab Results  Component Value Date   ALT 32 08/10/2023  AST 32 08/10/2023   ALKPHOS 109 08/10/2023   BILITOT 0.4 08/10/2023     She has some degenerative disease in her neck and back, sees chiropractor regularly and sees Dr. Ethelene Hal for pain management.    Neck pain, thoracic pain, and LBP. She underwent selective nerve root block bilateral L4 in September without much benefit. ***?on meloxicam?  PDMP reviewed. Had been getting #30 of tramadol monthly from Dr. Ethelene Hal since April 2024, increased to #60 on last refill 10/12/23. (Previously took T#3 chronically)   PMH, PSH, SH reviewed    ROS: no fever, chills, URI symptoms, dizziness, chest pain.   No n/v/d or bowel  changes.  No urinary complaints. No bleeding, bruising, rash. Eye twitching? Chronic neck and back pain per HPI    PHYSICAL EXAM:  There were no vitals taken for this visit.  Wt Readings from Last 3 Encounters:  05/20/23 184 lb 6.4 oz (83.6 kg)  05/10/23 165 lb (74.8 kg)  05/14/22 188 lb 9.6 oz (85.5 kg)   Pleasant female in no distress HEENT conjunctiva and sclera are clear, EOMI. Neck: no lymphadenopathy, thyromegaly or carotid bruit Heart: regular rate and rhythm Lungs: clear bilaterally Abdomen: soft, nontender, no mass Back: no spinal or CVA tenderness Extremities: no edema Neuro: alert and oriented, cranial nerves grossly intact, normal gait Psych: normal mood, affect, hygiene and grooming    ASSESSMENT/PLAN:  Did she get RSV from pharmacy?  Doesn't need A1c Needs c-met if taking meloxicam regularly.

## 2023-11-22 ENCOUNTER — Ambulatory Visit (INDEPENDENT_AMBULATORY_CARE_PROVIDER_SITE_OTHER): Payer: Medicare HMO | Admitting: Family Medicine

## 2023-11-22 ENCOUNTER — Encounter: Payer: Self-pay | Admitting: Family Medicine

## 2023-11-22 VITALS — BP 130/70 | HR 68 | Ht 66.0 in | Wt 184.8 lb

## 2023-11-22 DIAGNOSIS — F325 Major depressive disorder, single episode, in full remission: Secondary | ICD-10-CM | POA: Diagnosis not present

## 2023-11-22 DIAGNOSIS — Z5181 Encounter for therapeutic drug level monitoring: Secondary | ICD-10-CM | POA: Diagnosis not present

## 2023-11-22 DIAGNOSIS — R7301 Impaired fasting glucose: Secondary | ICD-10-CM | POA: Diagnosis not present

## 2023-11-22 DIAGNOSIS — K219 Gastro-esophageal reflux disease without esophagitis: Secondary | ICD-10-CM

## 2023-11-22 DIAGNOSIS — E7800 Pure hypercholesterolemia, unspecified: Secondary | ICD-10-CM | POA: Diagnosis not present

## 2023-11-22 DIAGNOSIS — I10 Essential (primary) hypertension: Secondary | ICD-10-CM | POA: Diagnosis not present

## 2023-11-22 DIAGNOSIS — R931 Abnormal findings on diagnostic imaging of heart and coronary circulation: Secondary | ICD-10-CM | POA: Diagnosis not present

## 2023-11-22 NOTE — Patient Instructions (Signed)
  I'd prefer that you not take meloxicam (anti-inflammatory) on a regular basis, if possible.  This is due to the risk to your kidneys and stomach. If your pain is doing better, you can consider topical voltaren gel instead (topical anti-inflammatory). Continue to use tylenol as needed, as well as the ultram for your pain, as per Dr. Ethelene Hal. Don't check blood pressure too many times in a row. Check it once, and repeat in 5 minutes if high. Record your pain score next to your blood pressure reading.  If it is high when in pain, you can recheck it after pain has improved (ie after tylenol, etc).

## 2023-11-23 ENCOUNTER — Encounter: Payer: Self-pay | Admitting: Family Medicine

## 2023-11-23 LAB — CMP14+EGFR
ALT: 35 [IU]/L — ABNORMAL HIGH (ref 0–32)
AST: 36 [IU]/L (ref 0–40)
Albumin: 4.4 g/dL (ref 3.9–4.9)
Alkaline Phosphatase: 129 [IU]/L — ABNORMAL HIGH (ref 44–121)
BUN/Creatinine Ratio: 16 (ref 12–28)
BUN: 11 mg/dL (ref 8–27)
Bilirubin Total: 0.2 mg/dL (ref 0.0–1.2)
CO2: 19 mmol/L — ABNORMAL LOW (ref 20–29)
Calcium: 9.2 mg/dL (ref 8.7–10.3)
Chloride: 105 mmol/L (ref 96–106)
Creatinine, Ser: 0.69 mg/dL (ref 0.57–1.00)
Globulin, Total: 2.5 g/dL (ref 1.5–4.5)
Glucose: 86 mg/dL (ref 70–99)
Potassium: 4.6 mmol/L (ref 3.5–5.2)
Sodium: 140 mmol/L (ref 134–144)
Total Protein: 6.9 g/dL (ref 6.0–8.5)
eGFR: 94 mL/min/{1.73_m2} (ref >59)

## 2023-11-26 DIAGNOSIS — M25561 Pain in right knee: Secondary | ICD-10-CM | POA: Diagnosis not present

## 2023-11-26 DIAGNOSIS — M25562 Pain in left knee: Secondary | ICD-10-CM | POA: Diagnosis not present

## 2023-11-30 DIAGNOSIS — Z0100 Encounter for examination of eyes and vision without abnormal findings: Secondary | ICD-10-CM | POA: Diagnosis not present

## 2023-12-06 DIAGNOSIS — H401131 Primary open-angle glaucoma, bilateral, mild stage: Secondary | ICD-10-CM | POA: Diagnosis not present

## 2024-01-03 DIAGNOSIS — H2513 Age-related nuclear cataract, bilateral: Secondary | ICD-10-CM | POA: Diagnosis not present

## 2024-01-03 DIAGNOSIS — H401121 Primary open-angle glaucoma, left eye, mild stage: Secondary | ICD-10-CM | POA: Diagnosis not present

## 2024-01-03 DIAGNOSIS — H401131 Primary open-angle glaucoma, bilateral, mild stage: Secondary | ICD-10-CM | POA: Diagnosis not present

## 2024-01-17 DIAGNOSIS — H401131 Primary open-angle glaucoma, bilateral, mild stage: Secondary | ICD-10-CM | POA: Diagnosis not present

## 2024-01-24 DIAGNOSIS — M5416 Radiculopathy, lumbar region: Secondary | ICD-10-CM | POA: Diagnosis not present

## 2024-01-24 DIAGNOSIS — M5030 Other cervical disc degeneration, unspecified cervical region: Secondary | ICD-10-CM | POA: Diagnosis not present

## 2024-01-27 DIAGNOSIS — L821 Other seborrheic keratosis: Secondary | ICD-10-CM | POA: Diagnosis not present

## 2024-01-27 DIAGNOSIS — Z85820 Personal history of malignant melanoma of skin: Secondary | ICD-10-CM | POA: Diagnosis not present

## 2024-01-27 DIAGNOSIS — Z08 Encounter for follow-up examination after completed treatment for malignant neoplasm: Secondary | ICD-10-CM | POA: Diagnosis not present

## 2024-01-27 DIAGNOSIS — L570 Actinic keratosis: Secondary | ICD-10-CM | POA: Diagnosis not present

## 2024-01-27 DIAGNOSIS — D225 Melanocytic nevi of trunk: Secondary | ICD-10-CM | POA: Diagnosis not present

## 2024-01-27 DIAGNOSIS — L814 Other melanin hyperpigmentation: Secondary | ICD-10-CM | POA: Diagnosis not present

## 2024-01-27 DIAGNOSIS — L448 Other specified papulosquamous disorders: Secondary | ICD-10-CM | POA: Diagnosis not present

## 2024-01-27 DIAGNOSIS — Z85828 Personal history of other malignant neoplasm of skin: Secondary | ICD-10-CM | POA: Diagnosis not present

## 2024-02-02 ENCOUNTER — Other Ambulatory Visit: Payer: Self-pay | Admitting: Medical

## 2024-02-02 DIAGNOSIS — I10 Essential (primary) hypertension: Secondary | ICD-10-CM

## 2024-02-14 DIAGNOSIS — H2512 Age-related nuclear cataract, left eye: Secondary | ICD-10-CM | POA: Diagnosis not present

## 2024-02-14 DIAGNOSIS — H401131 Primary open-angle glaucoma, bilateral, mild stage: Secondary | ICD-10-CM | POA: Diagnosis not present

## 2024-02-14 DIAGNOSIS — H2513 Age-related nuclear cataract, bilateral: Secondary | ICD-10-CM | POA: Diagnosis not present

## 2024-03-01 DIAGNOSIS — M9905 Segmental and somatic dysfunction of pelvic region: Secondary | ICD-10-CM | POA: Diagnosis not present

## 2024-03-01 DIAGNOSIS — M9903 Segmental and somatic dysfunction of lumbar region: Secondary | ICD-10-CM | POA: Diagnosis not present

## 2024-03-01 DIAGNOSIS — M5136 Other intervertebral disc degeneration, lumbar region with discogenic back pain only: Secondary | ICD-10-CM | POA: Diagnosis not present

## 2024-03-01 DIAGNOSIS — M9904 Segmental and somatic dysfunction of sacral region: Secondary | ICD-10-CM | POA: Diagnosis not present

## 2024-04-06 DIAGNOSIS — K5909 Other constipation: Secondary | ICD-10-CM | POA: Diagnosis not present

## 2024-04-06 DIAGNOSIS — K219 Gastro-esophageal reflux disease without esophagitis: Secondary | ICD-10-CM | POA: Diagnosis not present

## 2024-05-02 ENCOUNTER — Other Ambulatory Visit: Payer: Self-pay | Admitting: Family Medicine

## 2024-05-02 DIAGNOSIS — I10 Essential (primary) hypertension: Secondary | ICD-10-CM

## 2024-05-04 DIAGNOSIS — H2512 Age-related nuclear cataract, left eye: Secondary | ICD-10-CM | POA: Diagnosis not present

## 2024-05-04 DIAGNOSIS — H52209 Unspecified astigmatism, unspecified eye: Secondary | ICD-10-CM | POA: Diagnosis not present

## 2024-05-04 HISTORY — PX: CATARACT EXTRACTION: SUR2

## 2024-05-05 DIAGNOSIS — H2511 Age-related nuclear cataract, right eye: Secondary | ICD-10-CM | POA: Diagnosis not present

## 2024-05-18 DIAGNOSIS — H2511 Age-related nuclear cataract, right eye: Secondary | ICD-10-CM | POA: Diagnosis not present

## 2024-05-18 HISTORY — PX: CATARACT EXTRACTION: SUR2

## 2024-05-19 ENCOUNTER — Other Ambulatory Visit: Payer: Self-pay | Admitting: Family Medicine

## 2024-05-19 DIAGNOSIS — F325 Major depressive disorder, single episode, in full remission: Secondary | ICD-10-CM

## 2024-05-19 NOTE — Telephone Encounter (Signed)
 Patient has about 10 pills left so she can wait until her appt next week. She was notified of time and day of appt

## 2024-05-23 DIAGNOSIS — M5030 Other cervical disc degeneration, unspecified cervical region: Secondary | ICD-10-CM | POA: Diagnosis not present

## 2024-05-23 DIAGNOSIS — M5416 Radiculopathy, lumbar region: Secondary | ICD-10-CM | POA: Diagnosis not present

## 2024-05-23 DIAGNOSIS — Z79899 Other long term (current) drug therapy: Secondary | ICD-10-CM | POA: Diagnosis not present

## 2024-05-23 DIAGNOSIS — M5414 Radiculopathy, thoracic region: Secondary | ICD-10-CM | POA: Diagnosis not present

## 2024-05-24 NOTE — Patient Instructions (Incomplete)
 HEALTH MAINTENANCE RECOMMENDATIONS:  It is recommended that you get at least 30 minutes of aerobic exercise at least 5 days/week (for weight loss, you may need as much as 60-90 minutes). This can be any activity that gets your heart rate up. This can be divided in 10-15 minute intervals if needed, but try and build up your endurance at least once a week.  Weight bearing exercise is also recommended twice weekly.  Eat a healthy diet with lots of vegetables, fruits and fiber.  "Colorful" foods have a lot of vitamins (ie green vegetables, tomatoes, red peppers, etc).  Limit sweet tea, regular sodas and alcoholic beverages, all of which has a lot of calories and sugar.  Up to 1 alcoholic drink daily may be beneficial for women (unless trying to lose weight, watch sugars).  Drink a lot of water.  Calcium  recommendations are 1200-1500 mg daily (1500 mg for postmenopausal women or women without ovaries), and vitamin D  1000 IU daily.  This should be obtained from diet and/or supplements (vitamins), and calcium  should not be taken all at once, but in divided doses.  Monthly self breast exams and yearly mammograms for women over the age of 11 is recommended.  Sunscreen of at least SPF 30 should be used on all sun-exposed parts of the skin when outside between the hours of 10 am and 4 pm (not just when at beach or pool, but even with exercise, golf, tennis, and yard work!)  Use a sunscreen that says "broad spectrum" so it covers both UVA and UVB rays, and make sure to reapply every 1-2 hours.  Remember to change the batteries in your smoke detectors when changing your clock times in the spring and fall. Carbon monoxide detectors are recommended for your home.  Use your seat belt every time you are in a car, and please drive safely and not be distracted with cell phones and texting while driving.   Amanda Baldwin , Thank you for taking time to come for your Medicare Wellness Visit. I appreciate your ongoing  commitment to your health goals. Please review the following plan we discussed and let me know if I can assist you in the future.   This is a list of the screening recommended for you and due dates:  Health Maintenance  Topic Date Due   COVID-19 Vaccine (7 - Pfizer risk 2024-25 season) 04/08/2024   Flu Shot  07/21/2024   Mammogram  11/11/2024   Medicare Annual Wellness Visit  05/25/2025   Colon Cancer Screening  10/17/2027   DTaP/Tdap/Td vaccine (5 - Td or Tdap) 02/06/2031   Pneumonia Vaccine  Completed   DEXA scan (bone density measurement)  Completed   Hepatitis C Screening  Completed   Zoster (Shingles) Vaccine  Completed   HPV Vaccine  Aged Out   Meningitis B Vaccine  Aged Out    Please bring us  copies of your Living Will and Healthcare Power of Attorney once completed and notarized so that it can be scanned into your medical chart.  People over 65 are elgible for COVID booster every 6 months. You can get one from the pharmacy now, and again in 4 months with the updated version (or you can wait and just get it in the Fall, your choice).  You can consider getting the RSV vaccine in the Fall.  For low risk people, it is fine to wait until age 29.  Since you no longer have as much stress, and moods are good, you can  consider tapering back down on the sertraline  again (slowly, as discussed).

## 2024-05-24 NOTE — Progress Notes (Unsigned)
 No chief complaint on file.   Amanda Baldwin is a 68 y.o. female who presents for annual physical exam Medicare wellness isit and follow-up on chronic medical conditions.   See below for labs done prior to visit  Essential tremor--mild, L>R, not too bothersome. Doesn't feel treatment is needed. Previously saw neuro. Also saw for facial twitching, which resolved after she retired. She had MRI brain 06/2023 with minimal chronic microvascular ischemic change (typical for age).   Allergies:  she takes Allegra daily with good results (avoids Flonase  due to glaucoma). She denies sinus pain or discolored mucus.   HTN--Blood pressures are being checked at home with a wrist monitor (verified as accurate 11/2023). BP's are running ***    She denies headaches, chest pain, palpitations, shortness of breath, edema. She is getting regular exercise.  Low sodium diet?  BP Readings from Last 3 Encounters:  11/22/23 130/70  05/20/23 130/72  05/10/23 (!) 169/81     Asymmetry of kidneys was noted on 01/2017 abdominal US . She was sent for Duplex which didn't show significant blockage (1-59% bilaterally).  Dr. Shirley Douglas had said to consider CT angiogram (if kidney function remains normal) only if she has persistent difficult to control hypertension (which she hasn't demonstrated). She had echo back in 2013, notable for moderate diastolic dysfunction, EF 60-65%.      Depression:  She continues on sertraline  100 mg daily. Moods remain good.  She is retired. Enjoys going out to lunch with friends a few times/week, and going to Pilates.   Hyperlipidemia--She had calcium  score of 52.5 in 05/2023 (68th percentile for age). She was started on Rosuvastatin  10mg , and lipids were improved. She denies side effects.  Component Ref Range & Units (hover) 9 mo ago 1 yr ago 2 yr ago 4 yr ago 5 yr ago 6 yr ago 8 yr ago  Cholesterol, Total 147 234 High  215 High  243 High  211 High  236 High    Triglycerides 109 129 109  102 84 114 115 R  HDL 62 64 66 74 70 72 61 R  VLDL Cholesterol Cal 20 23 19 18 17 23    LDL Chol Calc (NIH) 65 147 High  130 High  151 High      Chol/HDL Ratio 2.4 3.7 CM 3.3 CM 3.3 CM 3.0 CM 3.3 CM 3.3 R   Diet--No creamy foods/soups, 1 egg daily.  Red meat 2-3x/week.  Uses lowfat dairy.    Chronic constipation and GERD:  She saw Dr. Lavaughn Portland for yearly visit in April. Linzess dose was decreased to 145 (d/t frequent BM's on higher dose). Reflux is well controlled on PPI.    H/o elevated LFT's.  Abdominal US  was done 01/2017 showing "question mild fatty infiltration of the liver." She sees Dr. Lavaughn Portland yearly, who treats her reflux, monitors her liver. Labs not done at last visit due to upcoming appt here.  Had US  02/2021, and again in 07/2022, which showed: IMPRESSION: Increased hepatic parenchymal echogenicity suggestive of steatosis.  No cholelithiasis or sonographic evidence for acute cholecystitis.  Component Ref Range & Units (hover) 6 mo ago (11/22/23) 9 mo ago (08/10/23) 1 yr ago (05/20/23) 2 yr ago (05/14/22) 2 yr ago (05/14/22) 2 yr ago (03/04/22) 4 yr ago (01/31/20    AST 36 32 34  41 High  45 High  34  ALT 35 High  32 50 High   47 High  50 High  40 High  Bilateral knee pain--saw Dr. Leighton Punches in  November and December 2024.  Treated with meloxicam  7.5 mg.   Chronic neck, thoracic and low back pain, under the care of Dr. Rexanne Catalina. Saw NP earlier this week. She periodically sees chiropractor (Dr. Lannis Plain in March, and back in the Fall.  She has pain on R thoracic wall, which radiates around to the front.  Suggested possible T8 ESI if desired. Doing pilates class with reformer machine 2x/week, and pain has improved some. Pain responds to tylenol  and ultram      PDMP reviewed. Had been getting #30 of tramadol  monthly from Dr. Rexanne Catalina since April 2024, increased to #60 in 10/12/23.  (Previously took T#3 chronically) Last filled #60 04/22/2024, prior fills 03/16/24, 02/01/24. She  takes *** (one most days??)    Vitamin D  deficiency--Last check was normal at 35.2 in 04/2023, when taking MVI daily and 2000 IU D3.  Previously had been low at 27.5 in 01/2020, when only taking 1000 IU plus MVI. She continues to take 2000 IU  of D3 and MVI daily.  Component Ref Range & Units (hover) 1 yr ago 2 yr ago 4 yr ago 6 yr ago 7 yr ago 8 yr ago 11 yr ago  Vit D, 25-Hydroxy 35.2 30.4 CM 27.5 Low  CM 35.6 CM 30 R, CM 26 Low  R, CM 39 R, CM   Glaucoma and cataracts--sees ophtho regularly.  Immunization History  Administered Date(s) Administered   Fluad Quad(high Dose 65+) 10/09/2023   Influenza Split 11/06/2009, 11/11/2009, 09/30/2011, 10/28/2012, 09/29/2013, 09/20/2014, 09/27/2015, 09/21/2019   Influenza, High Dose Seasonal PF 09/27/2021, 10/17/2022   Influenza,inj,Quad PF,6+ Mos 10/07/2019, 10/02/2020   Influenza-Unspecified 09/22/2015, 10/09/2016, 10/13/2017, 09/28/2018, 10/07/2019   PFIZER(Purple Top)SARS-COV-2 Vaccination 01/13/2020, 02/04/2020, 04/05/2021   PNEUMOCOCCAL CONJUGATE-20 12/15/2022   Pfizer Covid-19 Vaccine Bivalent Booster 20yrs & up 09/27/2021   Pfizer(Comirnaty)Fall Seasonal Vaccine 12 years and older 10/17/2022, 10/09/2023   Pneumococcal Polysaccharide-23 02/06/2021   Td 11/06/2001   Tdap 10/19/2011, 02/06/2021   Tetanus 12/21/2000, 12/21/2012   Unspecified SARS-COV-2 Vaccination 09/27/2021   Zoster Recombinant(Shingrix ) 07/28/2019, 11/27/2019   Zoster, Live 12/09/2012   Last Pap smear: UTD through GYN  (Dr. Ricky Charter) Last mammogram: 10/2023 (result not received) Last colonoscopy: 10/16/2022 colonoscopy with Dr. Lavaughn Portland, diverticulosis and hemorrhoids Last DEXA: [facility] 01/2022, normal, T-0.9 at R and L fem neck.  Ordered by GYN, Dr. Ricky Charter. Dentist: twice yearly   Ophtho: yearly  Exercise:    joined [employer], was going 3x/week. She was doing weights, hadn't started cardio yet, then she fractured her ankle.    She believes she has a living will and healthcare  power of attorney, but she plans to look at it again to see if anything needs to be changed. Hasn't done this yet.  New forms given last year in case she wants to make changes.  ***UPDATE  Patient Care Team: Roosvelt Colla, MD as PCP - General (Family Medicine) Terri Fester, MD as Consulting Physician (Obstetrics and Gynecology) Ozell Blunt, MD as Consulting Physician (Gastroenterology) Phebe Brasil, OD (Optometry) Adelaide Adjutant, MD as Consulting Physician (Physical Medicine and Rehabilitation) Anderson, Dena D, PA-C (Physician Assistant) Phebe Brasil, MD as Consulting Physician (Neurology) Ali Ink, MD as Consulting Physician (Orthopedic Surgery) Merideth Stands, MD as Referring Physician (Chiropractic Medicine)  Dentist: Dr. Patterson Bora  Depression Screening: Flowsheet Row Clinical Support from 11/22/2023 in [facility]  PHQ-2 Total Score 0       Flowsheet Row Office Visit from 05/20/2023 in [facility]  PHQ-9 Total Score 0       Falls screen:     11/22/2023   10:45 AM 05/20/2023    8:39 AM 05/14/2022    8:53 AM 03/04/2022   11:20 AM 02/06/2021    8:39 AM  Fall Risk   Falls in the past year? 1 1 0 0 0  Number falls in past yr: 0 1 0 0   Comment 04/07/23 03/30/23 walking and fell skinned tip of finger. 04/07/23 fell down flight of stairs and broke R ankle     Injury with Fall? 1 1 0 0   Comment fx right ankle      Risk for fall due to : History of fall(s) History of fall(s) No Fall Risks No Fall Risks   Follow up Falls evaluation completed Falls evaluation completed Falls evaluation completed Falls evaluation completed      Functional Status Survey:       No changes to her memory since the late 80's, "nothing new"   PMH, PSH, SH and FH were reviewed and updated     ROS: The patient denies anorexia, fever, decreased hearing, ear pain, sore throat, breast concerns, chest pain, palpitations, syncope, dyspnea on exertion, cough, swelling,  nausea, vomiting, diarrhea, melena, hematochezia, hematuria, incontinence, dysuria, vaginal bleeding, discharge, odor or itch, genital lesions, numbness, tingling, weakness, suspicious skin lesions, anxiety, abnormal bleeding/bruising, or enlarged lymph nodes.   She sees dermatologist yearly for skin checks. Essential tremor, not interfering with activities. Depression is stable/controlled Chronic back pain/arthritis, and neck pain, chiro helps Occasional bloating and gas, has occasional reflux, no dysphagia. Constipation is controlled with fiber/stool softener/metamucil *** Linzess No changes to hair/skin/nails. Mild congestion/allergies   PHYSICAL EXAM:  There were no vitals taken for this visit.  Wt Readings from Last 3 Encounters:  11/22/23 184 lb 12.8 oz (83.8 kg)  05/20/23 184 lb 6.4 oz (83.6 kg)  05/10/23 165 lb (74.8 kg)   Declines changing into gown--sees GYN and dermatologist regularly.   General Appearance:    Alert, cooperative, no distress, appears stated age     Head:    Normocephalic, without obvious abnormality, atraumatic.   Eyes:    PERRL, conjunctiva/corneas clear, EOM's intact, fundi benign     Ears:    Normal TM's and external ear canals     Nose:    No drainage, sinuses nontender.  Throat:    Clear without lesions or erythema.  Neck:    Supple, no lymphadenopathy; thyroid : no enlargement/ tenderness/nodules; no carotid bruit or JVD     Back:    Spine nontender, no curvature, ROM normal, no CVA tenderness     Lungs:    Clear to auscultation bilaterally without wheezes, rales or ronchi; respirations unlabored     Chest Wall:    No tenderness or deformity     Heart:    Regular rate and rhythm, S1 and S2 normal, no murmur, rub or gallop     Breast Exam:    Deferred to GYN     Abdomen:    Soft, non-tender, nondistended, normoactive bowel sounds, no masses,  no hepatosplenomegaly     Genitalia:    Deferred to GYN            Extremities:    No clubbing, cyanosis  or edema (on left). Small nodules present at DIP's of pinkies bilaterally, nontender.   Pulses:    2+ and symmetric all extremities     Skin:    Skin color, texture,  turgor normal, no rashes or lesions. Limited exam (chose not to get into gown, sees derm).   Lymph nodes:    Cervical, supraclavicular nodes normal     Neurologic:    Normal strength, sensation and gait; reflexes 2+ and symmetric throughout. Slight tremor noted L>R (when pointing index fingers at each other).                       Psych:   Normal mood, affect, hygiene and grooming   ***UPDATE nodules on hands, TREMOR?  Lab Results  Component Value Date   CHOL 147 08/10/2023   HDL 62 08/10/2023   LDLCALC 65 08/10/2023   TRIG 109 08/10/2023   CHOLHDL 2.4 08/10/2023     Chemistry      Component Value Date/Time   NA 140 11/22/2023 1208   K 4.6 11/22/2023 1208   CL 105 11/22/2023 1208   CO2 19 (L) 11/22/2023 1208   BUN 11 11/22/2023 1208   CREATININE 0.69 11/22/2023 1208   CREATININE 0.72 01/20/2017 1001      Component Value Date/Time   CALCIUM  9.2 11/22/2023 1208   ALKPHOS 129 (H) 11/22/2023 1208   AST 36 11/22/2023 1208   ALT 35 (H) 11/22/2023 1208   BILITOT 0.2 11/22/2023 1208     Fasting glucose ***   ASSESSMENT/PLAN:  Please get mammo results from 10/2023 (?done at GYN)--I see claim (umbrella), no result   Consider taper SSRI  Discussed monthly self breast exams and yearly mammograms; at least 30 minutes of aerobic activity at least 5 days/week, weight-bearing exercise at least 2x/week; proper sunscreen use reviewed; healthy diet, including goals of calcium  and vitamin D  intake and alcohol recommendations (less than or equal to 1 drink/day) reviewed; regular seatbelt use; changing batteries in smoke detectors. Immunization recommendations discussed--continue high dose flu shots yearly. Bivalent COVID boosters discussed. RSV vaccine discussed (in Fall vs waiting until age 32) Colonoscopy recommendations  reviewed--UTD.  MOST form reviewed, updated. Requested living will, healthcare power of attorney. New forms given in case she wants to make changes. ***   Med check 6 mos, CPE 1 year.   Medicare Attestation I have personally reviewed: The patient's medical and social history Their use of alcohol, tobacco or illicit drugs Their current medications and supplements The patient's functional ability including ADLs,fall risks, home safety risks, cognitive, and hearing and visual impairment Diet and physical activities Evidence for depression or mood disorders  The patient's weight, height, BMI have been recorded in the chart.  I have made referrals, counseling, and provided education to the patient based on review of the above and I have provided the patient with a written personalized care plan for preventive services.     Paulett Boros, MD

## 2024-05-25 ENCOUNTER — Encounter: Payer: Self-pay | Admitting: Family Medicine

## 2024-05-25 ENCOUNTER — Ambulatory Visit: Payer: Medicare HMO | Admitting: Family Medicine

## 2024-05-25 ENCOUNTER — Encounter: Payer: Self-pay | Admitting: *Deleted

## 2024-05-25 VITALS — BP 130/70 | HR 56 | Ht 66.0 in | Wt 179.4 lb

## 2024-05-25 DIAGNOSIS — I2510 Atherosclerotic heart disease of native coronary artery without angina pectoris: Secondary | ICD-10-CM

## 2024-05-25 DIAGNOSIS — Z0000 Encounter for general adult medical examination without abnormal findings: Secondary | ICD-10-CM

## 2024-05-25 DIAGNOSIS — R7301 Impaired fasting glucose: Secondary | ICD-10-CM | POA: Diagnosis not present

## 2024-05-25 DIAGNOSIS — I10 Essential (primary) hypertension: Secondary | ICD-10-CM

## 2024-05-25 DIAGNOSIS — E7800 Pure hypercholesterolemia, unspecified: Secondary | ICD-10-CM | POA: Diagnosis not present

## 2024-05-25 DIAGNOSIS — F325 Major depressive disorder, single episode, in full remission: Secondary | ICD-10-CM

## 2024-05-25 DIAGNOSIS — Z5181 Encounter for therapeutic drug level monitoring: Secondary | ICD-10-CM | POA: Diagnosis not present

## 2024-05-25 DIAGNOSIS — Z6828 Body mass index (BMI) 28.0-28.9, adult: Secondary | ICD-10-CM | POA: Diagnosis not present

## 2024-05-25 LAB — LIPID PANEL

## 2024-05-25 MED ORDER — SERTRALINE HCL 100 MG PO TABS
ORAL_TABLET | ORAL | 3 refills | Status: AC
Start: 2024-05-25 — End: ?

## 2024-05-26 ENCOUNTER — Encounter: Payer: Self-pay | Admitting: Family Medicine

## 2024-05-27 ENCOUNTER — Encounter: Payer: Self-pay | Admitting: Family Medicine

## 2024-05-29 ENCOUNTER — Ambulatory Visit: Payer: Self-pay | Admitting: Family Medicine

## 2024-05-29 LAB — COMPREHENSIVE METABOLIC PANEL WITH GFR
ALT: 36 IU/L — ABNORMAL HIGH (ref 0–32)
AST: 33 IU/L (ref 0–40)
Albumin: 4.8 g/dL (ref 3.9–4.9)
Alkaline Phosphatase: 133 IU/L — ABNORMAL HIGH (ref 44–121)
BUN/Creatinine Ratio: 18 (ref 12–28)
BUN: 16 mg/dL (ref 8–27)
Bilirubin Total: 0.2 mg/dL (ref 0.0–1.2)
CO2: 18 mmol/L — ABNORMAL LOW (ref 20–29)
Calcium: 9.9 mg/dL (ref 8.7–10.3)
Chloride: 98 mmol/L (ref 96–106)
Creatinine, Ser: 0.87 mg/dL (ref 0.57–1.00)
Globulin, Total: 2.6 g/dL (ref 1.5–4.5)
Glucose: 92 mg/dL (ref 70–99)
Potassium: 5 mmol/L (ref 3.5–5.2)
Sodium: 135 mmol/L (ref 134–144)
Total Protein: 7.4 g/dL (ref 6.0–8.5)
eGFR: 72 mL/min/{1.73_m2} (ref >59)

## 2024-05-29 LAB — LIPID PANEL
Chol/HDL Ratio: 2.5 ratio (ref 0.0–4.4)
Cholesterol, Total: 175 mg/dL (ref 100–199)
HDL: 69 mg/dL (ref >39)
LDL Chol Calc (NIH): 88 mg/dL (ref 0–99)
Triglycerides: 103 mg/dL (ref 0–149)
VLDL Cholesterol Cal: 18 mg/dL (ref 5–40)

## 2024-05-29 LAB — HEMOGLOBIN A1C
Est. average glucose Bld gHb Est-mCnc: 126 mg/dL
Hgb A1c MFr Bld: 6 % — ABNORMAL HIGH (ref 4.8–5.6)

## 2024-07-07 ENCOUNTER — Encounter: Payer: Self-pay | Admitting: Advanced Practice Midwife

## 2024-07-29 ENCOUNTER — Other Ambulatory Visit: Payer: Self-pay | Admitting: Family Medicine

## 2024-07-29 DIAGNOSIS — I10 Essential (primary) hypertension: Secondary | ICD-10-CM

## 2024-08-16 ENCOUNTER — Other Ambulatory Visit: Payer: Self-pay | Admitting: Family Medicine

## 2024-08-16 DIAGNOSIS — E7800 Pure hypercholesterolemia, unspecified: Secondary | ICD-10-CM

## 2024-08-25 DIAGNOSIS — H401131 Primary open-angle glaucoma, bilateral, mild stage: Secondary | ICD-10-CM | POA: Diagnosis not present

## 2024-08-25 DIAGNOSIS — H26492 Other secondary cataract, left eye: Secondary | ICD-10-CM | POA: Diagnosis not present

## 2024-08-25 DIAGNOSIS — H26493 Other secondary cataract, bilateral: Secondary | ICD-10-CM | POA: Diagnosis not present

## 2024-09-01 DIAGNOSIS — Z9842 Cataract extraction status, left eye: Secondary | ICD-10-CM | POA: Diagnosis not present

## 2024-09-01 DIAGNOSIS — H2512 Age-related nuclear cataract, left eye: Secondary | ICD-10-CM | POA: Diagnosis not present

## 2024-09-22 DIAGNOSIS — H26491 Other secondary cataract, right eye: Secondary | ICD-10-CM | POA: Diagnosis not present

## 2024-09-29 DIAGNOSIS — H53143 Visual discomfort, bilateral: Secondary | ICD-10-CM | POA: Diagnosis not present

## 2024-09-29 DIAGNOSIS — H2511 Age-related nuclear cataract, right eye: Secondary | ICD-10-CM | POA: Diagnosis not present

## 2024-09-29 DIAGNOSIS — Z961 Presence of intraocular lens: Secondary | ICD-10-CM | POA: Diagnosis not present

## 2024-10-02 ENCOUNTER — Other Ambulatory Visit: Payer: Self-pay

## 2024-10-02 ENCOUNTER — Encounter (HOSPITAL_BASED_OUTPATIENT_CLINIC_OR_DEPARTMENT_OTHER): Payer: Self-pay | Admitting: Emergency Medicine

## 2024-10-02 ENCOUNTER — Emergency Department (HOSPITAL_BASED_OUTPATIENT_CLINIC_OR_DEPARTMENT_OTHER): Admitting: Radiology

## 2024-10-02 ENCOUNTER — Emergency Department (HOSPITAL_BASED_OUTPATIENT_CLINIC_OR_DEPARTMENT_OTHER)
Admission: EM | Admit: 2024-10-02 | Discharge: 2024-10-02 | Disposition: A | Discharge status: Home / Self Care | Source: Ambulatory Visit | Attending: Emergency Medicine | Admitting: Emergency Medicine

## 2024-10-02 ENCOUNTER — Ambulatory Visit: Payer: Self-pay

## 2024-10-02 DIAGNOSIS — I10 Essential (primary) hypertension: Secondary | ICD-10-CM | POA: Insufficient documentation

## 2024-10-02 DIAGNOSIS — I2510 Atherosclerotic heart disease of native coronary artery without angina pectoris: Secondary | ICD-10-CM | POA: Diagnosis not present

## 2024-10-02 DIAGNOSIS — R079 Chest pain, unspecified: Secondary | ICD-10-CM

## 2024-10-02 DIAGNOSIS — R0789 Other chest pain: Secondary | ICD-10-CM | POA: Insufficient documentation

## 2024-10-02 DIAGNOSIS — D696 Thrombocytopenia, unspecified: Secondary | ICD-10-CM | POA: Diagnosis not present

## 2024-10-02 DIAGNOSIS — R0602 Shortness of breath: Secondary | ICD-10-CM | POA: Diagnosis not present

## 2024-10-02 DIAGNOSIS — I700 Atherosclerosis of aorta: Secondary | ICD-10-CM | POA: Diagnosis not present

## 2024-10-02 DIAGNOSIS — Z79899 Other long term (current) drug therapy: Secondary | ICD-10-CM | POA: Diagnosis not present

## 2024-10-02 DIAGNOSIS — E785 Hyperlipidemia, unspecified: Secondary | ICD-10-CM | POA: Insufficient documentation

## 2024-10-02 LAB — RESP PANEL BY RT-PCR (RSV, FLU A&B, COVID)  RVPGX2
Influenza A by PCR: NEGATIVE
Influenza B by PCR: NEGATIVE
Resp Syncytial Virus by PCR: NEGATIVE
SARS Coronavirus 2 by RT PCR: NEGATIVE

## 2024-10-02 LAB — COMPREHENSIVE METABOLIC PANEL WITH GFR
ALT: 39 U/L (ref 0–44)
AST: 35 U/L (ref 15–41)
Albumin: 4.4 g/dL (ref 3.5–5.0)
Alkaline Phosphatase: 111 U/L (ref 38–126)
Anion gap: 12 (ref 5–15)
BUN: 11 mg/dL (ref 8–23)
CO2: 23 mmol/L (ref 22–32)
Calcium: 9.9 mg/dL (ref 8.9–10.3)
Chloride: 101 mmol/L (ref 98–111)
Creatinine, Ser: 0.74 mg/dL (ref 0.44–1.00)
GFR, Estimated: >60 mL/min (ref >60)
Glucose, Bld: 108 mg/dL — ABNORMAL HIGH (ref 70–99)
Potassium: 4.2 mmol/L (ref 3.5–5.1)
Sodium: 136 mmol/L (ref 135–145)
Total Bilirubin: 0.4 mg/dL (ref 0.0–1.2)
Total Protein: 7.1 g/dL (ref 6.5–8.1)

## 2024-10-02 LAB — CBC WITH DIFFERENTIAL/PLATELET
Abs Immature Granulocytes: 0.01 K/uL (ref 0.00–0.07)
Basophils Absolute: 0 K/uL (ref 0.0–0.1)
Basophils Relative: 1 %
Eosinophils Absolute: 0.2 K/uL (ref 0.0–0.5)
Eosinophils Relative: 4 %
HCT: 37.7 % (ref 36.0–46.0)
Hemoglobin: 12.9 g/dL (ref 12.0–15.0)
Immature Granulocytes: 0 %
Lymphocytes Relative: 41 %
Lymphs Abs: 1.7 K/uL (ref 0.7–4.0)
MCH: 32.7 pg (ref 26.0–34.0)
MCHC: 34.2 g/dL (ref 30.0–36.0)
MCV: 95.4 fL (ref 80.0–100.0)
Monocytes Absolute: 0.4 K/uL (ref 0.1–1.0)
Monocytes Relative: 10 %
Neutro Abs: 1.8 K/uL (ref 1.7–7.7)
Neutrophils Relative %: 44 %
Platelets: 65 K/uL — ABNORMAL LOW (ref 150–400)
RBC: 3.95 MIL/uL (ref 3.87–5.11)
RDW: 12.1 % (ref 11.5–15.5)
WBC: 4 K/uL (ref 4.0–10.5)
nRBC: 0 % (ref 0.0–0.2)

## 2024-10-02 LAB — T4, FREE: Free T4: 0.76 ng/dL (ref 0.61–1.12)

## 2024-10-02 LAB — MAGNESIUM: Magnesium: 2 mg/dL (ref 1.7–2.4)

## 2024-10-02 LAB — D-DIMER, QUANTITATIVE: D-Dimer, Quant: 0.38 ug{FEU}/mL (ref 0.00–0.50)

## 2024-10-02 LAB — TROPONIN T, HIGH SENSITIVITY
Troponin T High Sensitivity: 45 ng/L — ABNORMAL HIGH (ref 0–19)
Troponin T High Sensitivity: 42 ng/L — ABNORMAL HIGH (ref 0–19)

## 2024-10-02 LAB — PRO BRAIN NATRIURETIC PEPTIDE: Pro Brain Natriuretic Peptide: 58 pg/mL (ref <300.0)

## 2024-10-02 LAB — TSH: TSH: 1.69 u[IU]/mL (ref 0.350–4.500)

## 2024-10-02 NOTE — Discharge Instructions (Addendum)
 Your EKG and chest x-ray was overall reassuring.  Your cardiac enzymes were very mildly elevated but flat and downtrending in the emergency department.  We discussed your case with on-call cardiology who recommends that you follow-up outpatient for consideration for outpatient cardiac workup.  Return for any severe crushing chest pain or any other concerns

## 2024-10-02 NOTE — Telephone Encounter (Signed)
 FYI Only or Action Required?: Action required by provider: request for appointment.  Patient was last seen in primary care on 05/25/2024 by Amanda Dawes, MD.  Called Nurse Triage reporting Chest Pain.  Symptoms began a week ago.  Interventions attempted: Nothing.  Symptoms are: gradually worsening.  Chest heaviness, getting worse, cannot take a deep breath. BP lower than normal 108/61  Triage Disposition: Go to ED Now (Notify PCP)  Patient/caregiver understands and will follow disposition?: Yes      Copied from CRM #[id number]. Topic: Clinical - Red Word Triage >> Oct 02, 2024  8:16 AM [place] MATSU wrote: Red Word that prompted transfer to Nurse Triage:  low blood pressure, chest feels heavy, stomach  hurts, Reason for Disposition  Difficulty breathing  Answer Assessment - Initial Assessment Questions 1. LOCATION: Where does it hurt?       middle 2. RADIATION: Does the pain go anywhere else? (e.g., into neck, jaw, arms, back)     no 3. ONSET: When did the chest pain begin? (Minutes, hours or days)      Last week 4. PATTERN: Does the pain come and go, or has it been constant since it started?  Does it get worse with exertion?      constant 5. DURATION: How long does it last (e.g., seconds, minutes, hours)     hours 6. SEVERITY: How bad is the pain?  (e.g., Scale 1-10; mild, moderate, or severe)     Heaviness  6-7 7. CARDIAC RISK FACTORS: Do you have any history of heart problems or risk factors for heart disease? (e.g., angina, prior heart attack; diabetes, high blood pressure, high cholesterol, smoker, or strong family history of heart disease)     HTN 8. PULMONARY RISK FACTORS: Do you have any history of lung disease?  (e.g., blood clots in lung, asthma, emphysema, birth control pills)     NO 9. CAUSE: What do you think is causing the chest pain?     UNSURE 10. OTHER SYMPTOMS: Do you have any other symptoms? (e.g., dizziness, nausea, vomiting, sweating,  fever, difficulty breathing, cough)       Hard to take deep breath  BP low 108/61 11. PREGNANCY: Is there any chance you are pregnant? When was your last menstrual period?       no  Protocols used: Chest Pain-A-AH

## 2024-10-02 NOTE — Telephone Encounter (Signed)
 At ED already

## 2024-10-02 NOTE — ED Notes (Signed)
 Attempt IV placement x2.  Once in RAFA and once in right hand without success.

## 2024-10-02 NOTE — ED Triage Notes (Signed)
 C/o palpation and elevated BP x 1 week. Some exertional SHOB.

## 2024-10-02 NOTE — Progress Notes (Unsigned)
   Cardiology Office Note:    Date:  10/02/2024   ID:  Amanda Baldwin, DOB [birth year], MRN [id number]  PCP:  Randol Dawes, MD  Cardiologist:  None { Click to update primary MD,subspecialty MD or APP then REFRESH:1}    Referring MD: Randol Dawes, MD   Chief Complaint: ***  History of Present Illness:    Amanda Baldwin is a 68 y.o. female with a history of hypertension, hyperlipidemia,            EKGs/Labs/Other Studies Reviewed:    The following studies were reviewed today: ***  EKG:  EKG *** ordered today. EKG personally reviewed and demonstrates ***.  Recent Labs: 10/02/2024: ALT 39; BUN 11; Creatinine, Ser 0.74; Hemoglobin 12.9; Magnesium  2.0; Platelets 65; Potassium 4.2; Pro Brain Natriuretic Peptide 58.0; Sodium 136; TSH 1.690  Recent Lipid Panel    Component Value Date/Time   CHOL 175 05/25/2024 1004   TRIG 103 05/25/2024 1004   HDL 69 05/25/2024 1004   CHOLHDL 2.5 05/25/2024 1004   CHOLHDL 3.3 01/20/2016 0001   VLDL 23 01/20/2016 0001   LDLCALC 88 05/25/2024 1004    Physical Exam:    Vital Signs: There were no vitals taken for this visit.    Wt Readings from Last 3 Encounters:  05/25/24 179 lb 6.4 oz (81.4 kg)  11/22/23 184 lb 12.8 oz (83.8 kg)  05/20/23 184 lb 6.4 oz (83.6 kg)     General: 68 y.o. female in no acute distress. HEENT: Normocephalic and atraumatic. Sclera clear.  Neck: Supple. No carotid bruits. No JVD. Heart: *** RRR. Distinct S1 and S2. No murmurs, gallops, or rubs.  Lungs: No increased work of breathing. Clear to ausculation bilaterally. No wheezes, rhonchi, or rales.  Abdomen: Soft, non-distended, and non-tender to palpation.  Extremities: No lower extremity edema.  Radial and distal pedal pulses 2+ and equal bilaterally. Skin: Warm and dry. Neuro: No focal deficits. Psych: Normal affect. Responds appropriately.   Assessment:    No diagnosis found.  Plan:     Disposition: Follow up in ***   Signed, Aline FORBES Door,  PA-C  10/02/2024 7:25 PM    [facility]

## 2024-10-02 NOTE — ED Provider Notes (Addendum)
 [facility] AT [facility] Provider Note   CSN: [id number] Arrival date & time: 10/02/24  9076     Patient presents with: Palpitations and Hypertension   Amanda Baldwin is a 68 y.o. female.    Palpitations Associated symptoms: chest pain and shortness of breath   Hypertension Associated symptoms include chest pain and shortness of breath.     68 year old female with medical history significant for depression, melanoma, C. difficile colitis treated with oral vancomycin, HLD, HTN, hiatal hernia presenting to the emergency department with chest pain and palpitations.  Patient endorses some exertional shortness of breath, chest pressure sensation.  She also states that she has been having palpitations and her blood pressure has been slightly elevated over the past week.  She denies any cough fever or chills.  No lower extremity swelling.  She denies any history of ACS.  Prior to Admission medications   Medication Sig Start Date End Date Taking? Authorizing Provider  ascorbic acid (VITAMIN C) 500 MG tablet Take 500 mg by mouth daily.    [provider]  B Complex-C (SUPER B COMPLEX PO) Take 1 capsule by mouth daily.    [provider]  cholecalciferol (VITAMIN D ) 1000 units tablet Take 2,000 Units by mouth daily.    [provider]  dicyclomine  (BENTYL ) 10 MG capsule Take 10 mg by mouth 3 (three) times daily before meals. Patient not taking: Reported on 05/25/2024 01/17/19   [provider]  fexofenadine (ALLEGRA) 180 MG tablet Take 180 mg by mouth daily.    [provider]  gatifloxacin (ZYMAXID) 0.5 % SOLN Place 1 drop into the right eye 4 (four) times daily. 05/05/24   [provider]  ketorolac  (ACULAR ) 0.5 % ophthalmic solution Place 1 drop into the right eye 4 (four) times daily. 05/05/24   [provider]  Lifitegrast 5 % SOLN Place 1 drop into both eyes 2 (two) times daily.  Patient not  taking: Reported on 05/25/2024    [provider]  LINZESS 290 MCG CAPS capsule Take 290 mcg by mouth every morning.    [provider]  lisinopril  (ZESTRIL ) 20 MG tablet TAKE 1 TABLET BY MOUTH EVERY DAY 07/31/24   Randol Dawes, MD  Magnesium  250 MG TABS Take 1 tablet by mouth every evening.    [provider]  meloxicam  (MOBIC ) 7.5 MG tablet Take 7.5 mg by mouth daily. Patient not taking: Reported on 05/25/2024 05/13/23   [provider]  Multiple Vitamin (MULTIVITAMIN) tablet Take 1 tablet by mouth daily.    [provider]  pantoprazole  (PROTONIX ) 40 MG tablet Take 40 mg by mouth daily. 12/16/21   [provider]  prednisoLONE acetate (PRED FORTE) 1 % ophthalmic suspension Place 1 drop into the left eye 4 (four) times daily. 05/05/24   [provider]  rosuvastatin  (CRESTOR ) 10 MG tablet TAKE 1 TABLET BY MOUTH EVERY DAY 08/16/24   Randol Dawes, MD  sertraline  (ZOLOFT ) 100 MG tablet TAKE 1 TABLET (100 MG TOTAL) BY MOUTH DAILY. 05/25/24   Randol Dawes, MD  simethicone  (MYLICON) 125 MG chewable tablet Chew 125 mg by mouth every 6 (six) hours as needed for flatulence. Patient not taking: Reported on 05/20/2023    [provider]  timolol (TIMOPTIC) 0.5 % ophthalmic solution Place 1 drop into the left eye 2 (two) times daily. 05/10/24   [provider]  traMADol  (ULTRAM ) 50 MG tablet Take 50 mg by mouth 2 (two) times daily.  06/09/23   [provider]    Allergies: Sulfamethoxazole-trimethoprim, Darvocet [propoxyphene n-acetaminophen ], Etodolac, Gabapentin , and Septra [bactrim]    Review of Systems  Respiratory:  Positive for shortness of breath.   Cardiovascular:  Positive for chest pain and palpitations.  All other systems reviewed and are negative.   Updated Vital Signs BP (!) 140/65   Pulse 64   Temp 98.3 F (36.8 C) (Oral)   Resp 20   SpO2 96%   Physical Exam Vitals and nursing note reviewed.  Constitutional:       General: She is not in acute distress.    Appearance: She is well-developed.  HENT:     Head: Normocephalic and atraumatic.  Eyes:     Conjunctiva/sclera: Conjunctivae normal.  Cardiovascular:     Rate and Rhythm: Normal rate and regular rhythm.     Heart sounds: No murmur heard. Pulmonary:     Effort: Pulmonary effort is normal. No respiratory distress.     Breath sounds: Normal breath sounds.  Abdominal:     Palpations: Abdomen is soft.     Tenderness: There is no abdominal tenderness.  Musculoskeletal:        General: No swelling.     Cervical back: Neck supple.  Skin:    General: Skin is warm and dry.     Capillary Refill: Capillary refill takes less than 2 seconds.  Neurological:     General: No focal deficit present.     Mental Status: She is alert. Mental status is at baseline.  Psychiatric:        Mood and Affect: Mood normal.     (all labs ordered are listed, but only abnormal results are displayed) Labs Reviewed  CBC WITH DIFFERENTIAL/PLATELET - Abnormal; Notable for the following components:      Result Value   Platelets 65 (*)    All other components within normal limits  COMPREHENSIVE METABOLIC PANEL WITH GFR - Abnormal; Notable for the following components:   Glucose, Bld 108 (*)    All other components within normal limits  TROPONIN T, HIGH SENSITIVITY - Abnormal; Notable for the following components:   Troponin T High Sensitivity 45 (*)    All other components within normal limits  TROPONIN T, HIGH SENSITIVITY - Abnormal; Notable for the following components:   Troponin T High Sensitivity 42 (*)    All other components within normal limits  RESP PANEL BY RT-PCR (RSV, FLU A&B, COVID)  RVPGX2  D-DIMER, QUANTITATIVE  TSH  MAGNESIUM   PRO BRAIN NATRIURETIC PEPTIDE  T4, FREE    EKG: EKG Interpretation Date/Time:  Monday October 02 2024 09:31:42 EDT Ventricular Rate:  71 PR Interval:  159 QRS Duration:  98 QT Interval:  408 QTC  Calculation: 444 R Axis:   3  Text Interpretation: Sinus rhythm Atrial premature complexes Low voltage, precordial leads Baseline wander in lead(s) III aVF V6 Confirmed by Jerrol Agent ([id number]) on 10/02/2024 9:46:18 AM  Radiology: [facility] Chest 2 View Result Date: 10/02/2024 CLINICAL DATA:  Shortness of breath. EXAM: CHEST - 2 VIEW COMPARISON:  Radiograph dated 01/03/2019. FINDINGS: No focal consolidation, pleural effusion, pneumothorax. The cardiac silhouette is within normal limits. Atherosclerotic calcification of the aortic arch. Degenerative changes of the spine. No acute osseous pathology. IMPRESSION: No active cardiopulmonary disease. Electronically Signed   By: Vanetta Chou M.D.   On: 10/02/2024 10:47     Procedures   Medications Ordered in the ED - No data to display  Medical Decision Making Amount and/or Complexity of Data Reviewed Labs: ordered. Radiology: ordered.    68 year old female with medical history significant for depression, melanoma, C. difficile colitis treated with oral vancomycin, HLD, HTN, hiatal hernia presenting to the emergency department with chest pain and palpitations.  Patient endorses some exertional shortness of breath, chest pressure sensation.  She also states that she has been having palpitations and her blood pressure has been slightly elevated over the past week.  She denies any cough fever or chills.  No lower extremity swelling.  She denies any history of ACS.  On arrival, the patient was vitally stable, afebrile, not tachycardic or tachypneic, saturating well on room air.  Initially hypertensive BP 169/83, improved to 139/69 without intervention.  Differential diagnosis includes cardiac arrhythmia, electrolyte abnormality, ACS, PE, heart failure, anxiety or dysfunction,.  Initial EKG revealed sinus rhythm, ventricular rate 71, low voltage, no acute ischemic changes noted.  Chest x-ray: No active cardiopulmonary  disease.  Laboratory evaluation: D-dimer negative, low concern for PE with normal D-dimer, BNP normal, patient without evidence of pulmonary edema or hypervolemia on exam, low concern for CHF.  COVID flu and RSV PCR testing was negative.  CBC without leukocytosis or anemia, thyroid  function normal.  CMP was generally unremarkable and her magnesium  was normal.  Initial cardiac troponin was mildly elevated at 45, repeat was downtrending to 42.  On repeat assessment, the patient was still experiencing mild chest pressure.  Given the patient's ongoing symptoms, cardiology was consulted. HEART score of 5. I spoke with Medford Nanas, MD of on-call cardiology who reviewed the patient's presentation and workup and advised no admission at this time, with plan for close outpatient follow-up.  Discussed the results of workup with the patient, advised that she follow-up closely with cardiology, return to the emergency department for any severe worsening symptoms.     Final diagnoses:  Chest pressure  Chest pain, unspecified type    ED Discharge Orders          Ordered    Ambulatory referral to Cardiology       Comments: If you have not heard from the Cardiology office within the next 72 hours please call [phone].   10/02/24 1353               Jerrol Agent, MD 10/02/24 1756    Jerrol Agent, MD 10/02/24 1757

## 2024-10-04 ENCOUNTER — Encounter: Payer: Self-pay | Admitting: Student

## 2024-10-04 ENCOUNTER — Emergency Department: Admitting: Student

## 2024-10-04 VITALS — BP 162/78 | HR 67 | Ht 66.0 in | Wt 180.0 lb

## 2024-10-04 DIAGNOSIS — D696 Thrombocytopenia, unspecified: Secondary | ICD-10-CM | POA: Diagnosis not present

## 2024-10-04 DIAGNOSIS — Z79899 Other long term (current) drug therapy: Secondary | ICD-10-CM | POA: Diagnosis not present

## 2024-10-04 DIAGNOSIS — R079 Chest pain, unspecified: Secondary | ICD-10-CM

## 2024-10-04 DIAGNOSIS — I10 Essential (primary) hypertension: Secondary | ICD-10-CM | POA: Diagnosis not present

## 2024-10-04 DIAGNOSIS — I2510 Atherosclerotic heart disease of native coronary artery without angina pectoris: Secondary | ICD-10-CM

## 2024-10-04 DIAGNOSIS — E785 Hyperlipidemia, unspecified: Secondary | ICD-10-CM

## 2024-10-04 MED ORDER — LISINOPRIL 40 MG PO TABS: 40.0000 mg | ORAL_TABLET | Freq: Every day | ORAL | 3 refills | Status: AC

## 2024-10-04 MED ORDER — METOPROLOL TARTRATE 25 MG PO TABS: ORAL_TABLET | ORAL | 0 refills | Status: DC

## 2024-10-04 NOTE — Patient Instructions (Addendum)
 Thank you for choosing [facility]!     Medication Instructions:  Take the Metoprolol  25mg  the morning o the Coronary CTA. DO NOT TAKE if heart rate is below 60.  Increase the Lisinopril  40mg . Take one tablet daily.   *If you need a refill on your cardiac medications before your next appointment, please call your pharmacy*   Lab Work: CBC TODAY  Return is one week for lab draw.................. BMET If you have labs (blood work) drawn today and your tests are completely normal, you will receive your results only by: MyChart Message (if you have MyChart) OR A paper copy in the mail If you have any lab test that is abnormal or we need to change your treatment, we will call you to review the results.   Testing/Procedures:   Your cardiac CT will be scheduled at one of the below locations:   [facility] [address] [place], [place] [address] [phone] (Severe contrast allergies only)  OR   [facility] [address] [place], [place] [address] [phone]  OR   [facility] [address] [place], [place] [address] [phone]  OR   [facility] [address]  [place], [place] [address] [phone]  OR   [facility] [address] [place], [place] [phone]  If scheduled at [facility], please arrive at the [facility]tr[facility] (Entrance C2) of [facility] 30 minutes prior to test start time. You can use the FREE valet parking offered at entrance C (encouraged to control the heart rate for the test)  Proceed to the [facility] Radiology Department (first floor) to check-in and test prep.  All radiology patients and guests should use entrance C2 at [facility], accessed from [facility], even though the hospital's physical address listed is [address].  If scheduled at the Heart and Vascular  Tower at [address], please enter the parking lot using the [address] entrance and use the FREE valet service at the patient drop-off area. Enter the building and check-in with registration on the main floor.  If scheduled at [facility], please arrive to the Heart and Vascular Center 15 mins early for check-in and test prep.  There is spacious parking and easy access to the radiology department from the [facility] and Vascular entrance. Please enter here and check-in with the desk attendant.   If scheduled at [facility], please arrive 30 minutes early for check-in and test prep.  Please follow these instructions carefully (unless otherwise directed):  An IV will be required for this test and Nitroglycerin will be given.  Hold all erectile dysfunction medications at least 3 days (72 hrs) prior to test. (Ie viagra, cialis, sildenafil, tadalafil, etc)   On the Night Before the Test: Be sure to Drink plenty of water. Do not consume any caffeinated/decaffeinated beverages or chocolate 12 hours prior to your test. Do not take any antihistamines 12 hours prior to your test.  If the patient has contrast allergy: Patient will need a prescription for Prednisone and very clear instructions (as follows): Prednisone 50 mg - take 13 hours prior to test Take another Prednisone 50 mg 7 hours prior to test Take another Prednisone 50 mg 1 hour prior to test Take Benadryl  50 mg 1 hour prior to test Patient must complete all four doses of above prophylactic medications. Patient will  need a ride after test due to Benadryl .  On the Day of the Test: Drink plenty of water until 1 hour prior to the test. Do not eat any food 1 hour prior to test. You may take your regular medications prior to the test.  Take metoprolol  (Lopressor ) two hours prior to test. If you take Furosemide/Hydrochlorothiazide/Spironolactone/Chlorthalidone, please HOLD on the morning of the  test. Patients who wear a continuous glucose monitor MUST remove the device prior to scanning. FEMALES- please wear underwire-free bra if available, avoid dresses & tight clothing       After the Test: Drink plenty of water. After receiving IV contrast, you may experience a mild flushed feeling. This is normal. On occasion, you may experience a mild rash up to 24 hours after the test. This is not dangerous. If this occurs, you can take Benadryl  25 mg, Zyrtec, Claritin, or Allegra and increase your fluid intake. (Patients taking Tikosyn should avoid Benadryl , and may take Zyrtec, Claritin, or Allegra) If you experience trouble breathing, this can be serious. If it is severe call 911 IMMEDIATELY. If it is mild, please call our office.  We will call to schedule your test 2-4 weeks out understanding that some insurance companies will need an authorization prior to the service being performed.   For more information and frequently asked questions, please visit our website : [email or web address]  For non-scheduling related questions, please contact the cardiac imaging nurse navigator should you have any questions/concerns: Cardiac Imaging Nurse Navigators Direct Office Dial: [phone]   For scheduling needs, including cancellations and rescheduling, please call [place], [phone].      Your physician has requested that you have an echocardiogram. Echocardiography is a painless test that uses sound waves to create images of your heart. It provides your doctor with information about the size and shape of your heart and how well your heart's chambers and valves are working. This procedure takes approximately one hour. There are no restrictions for this procedure. Please do NOT wear cologne, perfume, aftershave, or lotions (deodorant is allowed). Please arrive 15 minutes prior to your appointment time.  Please note: We ask at that you not bring children with you during  ultrasound (echo/ vascular) testing. Due to room size and safety concerns, children are not allowed in the ultrasound rooms during exams. Our front office staff cannot provide observation of children in our lobby area while testing is being conducted. An adult accompanying a patient to their appointment will only be allowed in the ultrasound room at the discretion of the ultrasound technician under special circumstances. We apologize for any inconvenience.   Your next appointment:   2 month(s)   Provider:   Darryle Decent     Follow-Up: At [facility], you and your health needs are our priority.  As part of our continuing mission to provide you with exceptional heart care, we have created designated Provider Care Teams.  These Care Teams include your primary Cardiologist (physician) and Advanced Practice Providers (APPs -  Physician Assistants and Nurse Practitioners) who all work together to provide you with the care you need, when you need it. We recommend signing up for the patient portal called MyChart.  Sign up information is provided on this After Visit Summary.  MyChart is used to connect with patients for Virtual Visits (Telemedicine).  Patients are able to view lab/test results, encounter notes, upcoming appointments, etc.  Non-urgent messages can be sent to your provider as well.   To learn more about  what you can do with MyChart, go to [email or web address].

## 2024-10-05 LAB — CBC
Hematocrit: 42.9 % (ref 34.0–46.6)
Hemoglobin: 13.6 g/dL (ref 11.1–15.9)
MCH: 30.6 pg (ref 26.6–33.0)
MCHC: 31.7 g/dL (ref 31.5–35.7)
MCV: 96 fL (ref 79–97)
Platelets: 302 x10E3/uL (ref 150–450)
RBC: 4.45 x10E6/uL (ref 3.77–5.28)
RDW: 12 % (ref 11.7–15.4)
WBC: 5.5 x10E3/uL (ref 3.4–10.8)

## 2024-10-06 ENCOUNTER — Ambulatory Visit: Payer: Self-pay | Admitting: Student

## 2024-10-10 ENCOUNTER — Encounter (HOSPITAL_COMMUNITY): Payer: Self-pay

## 2024-10-12 ENCOUNTER — Ambulatory Visit (HOSPITAL_COMMUNITY)
Admission: RE | Admit: 2024-10-12 | Discharge: 2024-10-12 | Disposition: A | Discharge status: Home / Self Care | Source: Ambulatory Visit | Attending: Student | Admitting: Student

## 2024-10-12 DIAGNOSIS — I2510 Atherosclerotic heart disease of native coronary artery without angina pectoris: Secondary | ICD-10-CM | POA: Insufficient documentation

## 2024-10-12 DIAGNOSIS — R079 Chest pain, unspecified: Secondary | ICD-10-CM | POA: Diagnosis not present

## 2024-10-12 MED ORDER — NITROGLYCERIN 0.4 MG SL SUBL
0.8000 mg | SUBLINGUAL_TABLET | Freq: Once | SUBLINGUAL | Status: AC
  Administered 2024-10-12: 0.8 mg via SUBLINGUAL

## 2024-10-12 MED ORDER — IOHEXOL 350 MG/ML SOLN
100.0000 mL | Freq: Once | INTRAVENOUS | Status: AC | PRN
  Administered 2024-10-12: 100 mL via INTRAVENOUS

## 2024-10-13 LAB — BASIC METABOLIC PANEL WITH GFR
BUN/Creatinine Ratio: 14 (ref 12–28)
BUN: 11 mg/dL (ref 8–27)
CO2: 18 mmol/L — ABNORMAL LOW (ref 20–29)
Calcium: 9.6 mg/dL (ref 8.7–10.3)
Chloride: 99 mmol/L (ref 96–106)
Creatinine, Ser: 0.78 mg/dL (ref 0.57–1.00)
Glucose: 73 mg/dL (ref 70–99)
Potassium: 4.6 mmol/L (ref 3.5–5.2)
Sodium: 137 mmol/L (ref 134–144)
eGFR: 82 mL/min/1.73 (ref >59)

## 2024-10-28 ENCOUNTER — Other Ambulatory Visit: Payer: Self-pay | Admitting: Family Medicine

## 2024-10-28 DIAGNOSIS — I10 Essential (primary) hypertension: Secondary | ICD-10-CM

## 2024-11-01 DIAGNOSIS — M546 Pain in thoracic spine: Secondary | ICD-10-CM | POA: Diagnosis not present

## 2024-11-01 DIAGNOSIS — M5030 Other cervical disc degeneration, unspecified cervical region: Secondary | ICD-10-CM | POA: Diagnosis not present

## 2024-11-01 DIAGNOSIS — G8929 Other chronic pain: Secondary | ICD-10-CM | POA: Diagnosis not present

## 2024-11-01 DIAGNOSIS — M25561 Pain in right knee: Secondary | ICD-10-CM | POA: Diagnosis not present

## 2024-11-01 DIAGNOSIS — M5416 Radiculopathy, lumbar region: Secondary | ICD-10-CM | POA: Diagnosis not present

## 2024-11-01 DIAGNOSIS — M25562 Pain in left knee: Secondary | ICD-10-CM | POA: Diagnosis not present

## 2024-11-15 ENCOUNTER — Ambulatory Visit (HOSPITAL_COMMUNITY)
Admission: RE | Admit: 2024-11-15 | Discharge: 2024-11-15 | Disposition: A | Discharge status: Home / Self Care | Source: Ambulatory Visit | Attending: Cardiology | Admitting: Cardiology

## 2024-11-15 DIAGNOSIS — R079 Chest pain, unspecified: Secondary | ICD-10-CM | POA: Diagnosis not present

## 2024-11-15 LAB — ECHOCARDIOGRAM COMPLETE
Area-P 1/2: 2.99 cm2
S' Lateral: 2.22 cm

## 2024-11-17 ENCOUNTER — Other Ambulatory Visit: Payer: Self-pay | Admitting: Family Medicine

## 2024-11-17 DIAGNOSIS — E7800 Pure hypercholesterolemia, unspecified: Secondary | ICD-10-CM

## 2024-11-22 DIAGNOSIS — Z1231 Encounter for screening mammogram for malignant neoplasm of breast: Secondary | ICD-10-CM | POA: Diagnosis not present

## 2024-11-22 LAB — HM MAMMOGRAPHY

## 2024-11-23 ENCOUNTER — Encounter: Payer: Self-pay | Admitting: Family Medicine

## 2024-11-29 NOTE — Progress Notes (Unsigned)
°  Cardiology Office Note:  .   Date:  11/29/2024  ID:  Amanda Baldwin, DOB 1956/02/02, MRN [id number] PCP: Randol Dawes, MD  [facility] Providers Cardiologist:  None   History of Present Illness: .   No chief complaint on file.   Amanda Baldwin is a 68 y.o. female with below history who presents for follow-up of CAD.   History of Present Illness               Problem List Non-obstructive CAD - CAC 52 (66th percentile) - mild 25-49% LAD 2. HLD -T chol 175, HDL 69, LDL 88, TG 103 3. HTN    ROS: All other ROS reviewed and negative. Pertinent positives noted in the HPI.     Studies Reviewed: SABRA       TTE 11/15/2024  1. Left ventricular ejection fraction, by estimation, is 60 to 65%. Left  ventricular ejection fraction by 3D volume is 58 %. The left ventricle has  normal function. The left ventricle has no regional wall motion  abnormalities. Left ventricular diastolic   parameters were normal. The average left ventricular global longitudinal  strain is -21.3 %. The global longitudinal strain is normal.   2. Right ventricular systolic function is normal. The right ventricular  size is normal. There is normal pulmonary artery systolic pressure. The  estimated right ventricular systolic pressure is 33.0 mmHg.   3. The mitral valve is normal in structure. Mild mitral valve  regurgitation. No evidence of mitral stenosis.   4. The aortic valve is normal in structure. Aortic valve regurgitation is  not visualized. No aortic stenosis is present.   5. The inferior vena cava is normal in size with greater than 50%  respiratory variability, suggesting right atrial pressure of 3 mmHg.   CCTA 10/19/2024 IMPRESSION: 1. Coronary artery calcium  score 52.9 AU. This places the patient in the 66th percentile for age and gender, suggesting intermediate risk for future cardiac events.   2.  Mild nonobstructive disease in the LAD.  Physical Exam:   VS:  There were no vitals  taken for this visit.   Wt Readings from Last 3 Encounters:  10/04/24 180 lb (81.6 kg)  05/25/24 179 lb 6.4 oz (81.4 kg)  11/22/23 184 lb 12.8 oz (83.8 kg)    GEN: Well nourished, well developed in no acute distress NECK: No JVD; No carotid bruits CARDIAC: ***RRR, no murmurs, rubs, gallops RESPIRATORY:  Clear to auscultation without rales, wheezing or rhonchi  ABDOMEN: Soft, non-tender, non-distended EXTREMITIES:  No edema; No deformity  ASSESSMENT AND PLAN: .   Assessment and Plan                 {Are you ordering a CV Procedure (e.g. stress test, cath, DCCV, TEE, etc)?   Press F2        :[id number]}   Follow-up: No follow-ups on file.  Signed, Darryle DASEN. Barbaraann, MD, [facility]  [facility]  [address] [place], [place] [address] [phone]  4:26 PM

## 2024-11-30 DIAGNOSIS — M5414 Radiculopathy, thoracic region: Secondary | ICD-10-CM | POA: Diagnosis not present

## 2024-12-03 NOTE — Progress Notes (Unsigned)
 No chief complaint on file.  Patient presents for 6 month follow-up on chronic problems. She has been seeing cardiology, since going to ER for chest pain.  Her lisinopril  dose was increased to 40mg , and she had coronary CTA and echocardiogram done. She has f/u with cardiology scheduled for next week.  F/u b-met after ACEI dose increase was normal.  Echo 10/2024 showed EF of 60-65% and mild MR.  Coronary CTA 09/2024 IMPRESSION: 1. Coronary artery calcium  score 52.9 AU. This places the patient in the 66th percentile for age and gender, suggesting intermediate risk for future cardiac events. 2.  Mild nonobstructive disease in the LAD.  HTN--Blood pressures are being checked at home with a wrist monitor (verified as accurate 11/2023). Since lisinopril  dose was increased to 40mg , her BP's are running ***  Pulse is ***  She hasn't had any further chest pain. *** She denies headaches, palpitations, shortness of breath, edema. She is getting regular exercise. Admits to adding salt to her food, has some soup and french fries, mostly follows low sodium diet (other than added salt). ***   BP Readings from Last 3 Encounters:  10/12/24 122/60  10/04/24 (!) 162/78  10/02/24 (!) 140/65    Hyperlipidemia--She was started on rosuvastatin  10 mg after she had calcium  score of 52.5 in 05/2023 (68th percentile for age). She denies side effects.  Diet--Has some ice cream, some creamy soups when making casserole.  She has 2 hard boiled egg whites daily.  Has the yolk with fried eggs, very infrequent (only if out).  Red meat 2-3x/week.  Uses lowfat dairy.   Lab Results  Component Value Date   CHOL 175 05/25/2024   HDL 69 05/25/2024   LDLCALC 88 05/25/2024   TRIG 103 05/25/2024   CHOLHDL 2.5 05/25/2024   Essential tremor--mild, L>R, not too bothersome. Doesn't feel treatment is needed, hasn't gotten any worse. Previously saw neuro. Also saw for facial twitching, which resolved after she retired. She  had MRI brain 06/2023 with minimal chronic microvascular ischemic change (typical for age). She hasn't had any recurrence.   Allergies:  she takes Allegra as needed with good results (avoids Flonase  due to glaucoma).   ***    Depression:  She continues on sertraline  100 mg daily. Moods remain good.  She is retired. Enjoys going out to lunch with friends and going to Pilates.    Chronic constipation and GERD:    Under the care of Dr. Rosalie. Previously tried Linzess 145 mcg samples, constipation wasn't as well controlled so went back to 290 mcg.  She preferred the frequent loose stools in the morning (lasting 1-1.5 hours after taking the medication) over any constipation. ***UPDATE  GERD: on pantoprazole  40 mg daily.   ***UPDATE   H/o elevated LFT's.  Abdominal US  was done 01/2017 showing question mild fatty infiltration of the liver. She sees Dr. Rosalie yearly, who treats her reflux, monitors her liver. Last LFT's were normal in October.  Lab Results  Component Value Date   ALT 39 10/02/2024   AST 35 10/02/2024   ALKPHOS 111 10/02/2024   BILITOT 0.4 10/02/2024    Bilateral knee pain--saw Dr. Duwayne last year and did well with a course of meloxicam  7.5 mg. She uses it just prn. ***UPDATE   Chronic neck, thoracic and low back pain, under the care of Dr. Bonner.  Last seen in his office in November. She is prescribed tramadol  for her chronic pain, managed by that office. PDMP reviewed. Quantity of Tramadol   was increased from #30 to #60 last year, and on last refill was increased to #90. She takes 1.5 tablets most days, sometimes needs 2/day. ***UPDATE  She periodically sees chiropractor (Dr. Michaela in March, and back in the Fall. She still has problems with her neck (why she saw him in March). Getting a massage monthly at [facility] helps.   Doing pilates class with reformer machine 2x/week, and pain has improved some. Pain responds to tramadol .  Hasn't taken any tylenol .  Rarely  will use meloxicam .      Vitamin D  deficiency--Last check was normal at 35.2 in 04/2023, when taking MVI daily and 2000 IU D3.  Previously had been low at 27.5 in 01/2020, when only taking 1000 IU plus MVI. She continues to take 2000 IU  of D3 and MVI daily.     PMH, PSH, SH reviewed   ROS: Chronic pain per HPI. Moods are stable/good. No bleeding, bruising, rash. No headaches, dizziness, chest pain, edema, shortness of breath. No fever, chills, URI symptoms. Allergies aren't flaring. No n/v or abdominal pain..  No urinary complaints. Constipation per HPI. GERD ***     PHYSICAL EXAM:  There were no vitals taken for this visit.  Wt Readings from Last 3 Encounters:  10/04/24 180 lb (81.6 kg)  05/25/24 179 lb 6.4 oz (81.4 kg)  11/22/23 184 lb 12.8 oz (83.8 kg)    Pleasant female in no distress HEENT conjunctiva and sclera are clear, EOMI. Neck: no lymphadenopathy, thyromegaly or carotid bruit Heart: regular rate and rhythm Lungs: clear bilaterally No chest wall tenderness Abdomen: soft, nontender, no mass Back: no spinal or CVA tenderness, no SI or muscular tenderness Extremities: no edema Neuro: alert and oriented, cranial nerves grossly intact, normal gait. No obvious tremor noted today Psych: normal mood, affect, hygiene and grooming   ASSESSMENT/PLAN:   LDL was 88 in 05/2024. Likely should be <70. ?repeat lipids (if fasting) vs increase dose to 20 mg and recheck in 3 mos, vs she can discuss with cardiology next week    F/u in June for wellness visit, as scheduled

## 2024-12-04 ENCOUNTER — Encounter: Payer: Self-pay | Admitting: Family Medicine

## 2024-12-04 ENCOUNTER — Ambulatory Visit: Payer: Self-pay | Admitting: Family Medicine

## 2024-12-04 VITALS — BP 132/80 | HR 63 | Ht 65.5 in | Wt 178.2 lb

## 2024-12-04 DIAGNOSIS — E7800 Pure hypercholesterolemia, unspecified: Secondary | ICD-10-CM

## 2024-12-04 DIAGNOSIS — I10 Essential (primary) hypertension: Secondary | ICD-10-CM

## 2024-12-04 DIAGNOSIS — I2510 Atherosclerotic heart disease of native coronary artery without angina pectoris: Secondary | ICD-10-CM

## 2024-12-04 DIAGNOSIS — H9313 Tinnitus, bilateral: Secondary | ICD-10-CM

## 2024-12-04 DIAGNOSIS — K219 Gastro-esophageal reflux disease without esophagitis: Secondary | ICD-10-CM | POA: Diagnosis not present

## 2024-12-04 DIAGNOSIS — G250 Essential tremor: Secondary | ICD-10-CM

## 2024-12-04 DIAGNOSIS — E559 Vitamin D deficiency, unspecified: Secondary | ICD-10-CM | POA: Diagnosis not present

## 2024-12-04 DIAGNOSIS — F325 Major depressive disorder, single episode, in full remission: Secondary | ICD-10-CM | POA: Diagnosis not present

## 2024-12-04 NOTE — Patient Instructions (Addendum)
 I think that we should increase your rosuvastatin  dose to 20 mg, to get the LDL below 70 (was 88 on your last check in June).  Feel free to discuss this with your cardiologist next week.  I'm happy to make this change if they agree, but don't write the prescription (I don't know if they would want to recheck this or not).  Your blood pressures are above goal on the 20 mg dose of lisinopril  (goal is 130/80 or less). Increase back to the 40 mg dose.  You have refills on file at the pharmacy.   Try and limit the sodium in your diet--avoiding soups (using low sodium ingredients if you are making it yourself). Salt can contribute to higher blood pressures.

## 2024-12-08 ENCOUNTER — Ambulatory Visit: Admitting: Cardiovascular Disease

## 2024-12-11 ENCOUNTER — Ambulatory Visit: Attending: Cardiovascular Disease | Admitting: Cardiovascular Disease

## 2024-12-11 ENCOUNTER — Encounter: Payer: Self-pay | Admitting: Cardiovascular Disease

## 2024-12-11 VITALS — BP 148/83 | HR 62 | Ht 66.0 in | Wt 181.2 lb

## 2024-12-11 DIAGNOSIS — E7800 Pure hypercholesterolemia, unspecified: Secondary | ICD-10-CM | POA: Diagnosis not present

## 2024-12-11 DIAGNOSIS — E782 Mixed hyperlipidemia: Secondary | ICD-10-CM

## 2024-12-11 DIAGNOSIS — I2510 Atherosclerotic heart disease of native coronary artery without angina pectoris: Secondary | ICD-10-CM | POA: Diagnosis not present

## 2024-12-11 DIAGNOSIS — I10 Essential (primary) hypertension: Secondary | ICD-10-CM

## 2024-12-11 MED ORDER — ROSUVASTATIN CALCIUM 20 MG PO TABS: 20.0000 mg | ORAL_TABLET | Freq: Every day | ORAL | 3 refills | Status: AC

## 2024-12-11 MED ORDER — AMLODIPINE BESYLATE 5 MG PO TABS: 5.0000 mg | ORAL_TABLET | Freq: Every day | ORAL | 3 refills | Status: AC

## 2024-12-11 NOTE — Patient Instructions (Addendum)
 Medication Instructions:  Your physician has recommended you make the following change in your medication:   -Increase rosuvastatin  (Crestor ) to 20mg  once daily.  -Start amlodipine  (Norvasc ) 5mg  once daily.   *If you need a refill on your cardiac medications before your next appointment, please call your pharmacy*   Follow-Up: At [facility], you and your health needs are our priority.  As part of our continuing mission to provide you with exceptional heart care, our providers are all part of one team.  This team includes your primary Cardiologist (physician) and Advanced Practice Providers or APPs (Physician Assistants and Nurse Practitioners) who all work together to provide you with the care you need, when you need it.  Your next appointment:   12 month(s)  Provider:   One of our Advanced Practice Providers (APPs): Morse Clause, PA-C  Lamarr Satterfield, NP Miriam Shams, NP  Olivia Pavy, PA-C Josefa Beauvais, NP  Leontine Salen, PA-C Orren Fabry, PA-C  Bliss Corner, PA-C Ernest Dick, NP  Damien Braver, NP Jon Hails, PA-C  Waddell Donath, PA-C    Dayna Dunn, PA-C  Scott Weaver, PA-C Lum Louis, NP Katlyn West, NP Callie Goodrich, PA-C  Xika Zhao, NP Sheng Haley, PA-C    Kathleen Johnson, PA-C     We recommend signing up for the patient portal called MyChart.  Sign up information is provided on this After Visit Summary.  MyChart is used to connect with patients for Virtual Visits (Telemedicine).  Patients are able to view lab/test results, encounter notes, upcoming appointments, etc.  Non-urgent messages can be sent to your provider as well.   To learn more about what you can do with MyChart, go to [email or web address].   Other Instructions Please take your blood pressure daily and record. Please include heart rates.  HOW TO TAKE YOUR BLOOD PRESSURE: Rest 5 minutes before taking your blood pressure. Dont smoke or drink caffeinated beverages for at least 30  minutes before. Take your blood pressure before (not after) you eat. Sit comfortably with your back supported and both feet on the floor (dont cross your legs). Elevate your arm to heart level on a table or a desk. Use the proper sized cuff. It should fit smoothly and snugly around your bare upper arm. There should be enough room to slip a fingertip under the cuff. The bottom edge of the cuff should be 1 inch above the crease of the elbow. Ideally, take 3 measurements at one sitting and record the average.  Blood Pressure Log Date/Time Medications taken? (Y/N) Blood Pressure Heart Rate/Pulse

## 2024-12-12 ENCOUNTER — Encounter: Payer: Self-pay | Admitting: Family Medicine

## 2025-01-15 ENCOUNTER — Encounter: Payer: Self-pay | Admitting: Family Medicine

## 2025-06-11 ENCOUNTER — Encounter: Payer: Self-pay | Admitting: Family Medicine
